# Patient Record
Sex: Male | Born: 1950
Health system: Southern US, Community
[De-identification: ages and names within clinical notes are randomized; demographics above are authoritative.]

## PROBLEM LIST (undated history)

## (undated) DIAGNOSIS — Z860100 Personal history of colon polyps, unspecified: Secondary | ICD-10-CM

## (undated) DIAGNOSIS — K649 Unspecified hemorrhoids: Secondary | ICD-10-CM

## (undated) DIAGNOSIS — E041 Nontoxic single thyroid nodule: Secondary | ICD-10-CM

## (undated) DIAGNOSIS — I10 Essential (primary) hypertension: Secondary | ICD-10-CM

## (undated) DIAGNOSIS — C61 Malignant neoplasm of prostate: Secondary | ICD-10-CM

## (undated) DIAGNOSIS — Z973 Presence of spectacles and contact lenses: Secondary | ICD-10-CM

## (undated) DIAGNOSIS — E785 Hyperlipidemia, unspecified: Secondary | ICD-10-CM

## (undated) DIAGNOSIS — N401 Enlarged prostate with lower urinary tract symptoms: Secondary | ICD-10-CM

## (undated) HISTORY — DX: Essential (primary) hypertension: I10

## (undated) HISTORY — PX: PROSTATE BIOPSY: SHX241

## (undated) HISTORY — DX: Hyperlipidemia, unspecified: E78.5

## (undated) HISTORY — PX: COLONOSCOPY: SHX174

## (undated) HISTORY — PX: LACERATION REPAIR: SHX5168

---

## 2011-08-23 DIAGNOSIS — I209 Angina pectoris, unspecified: Secondary | ICD-10-CM

## 2011-08-23 DIAGNOSIS — R0600 Dyspnea, unspecified: Secondary | ICD-10-CM

## 2011-08-23 DIAGNOSIS — J189 Pneumonia, unspecified organism: Secondary | ICD-10-CM

## 2011-08-23 HISTORY — DX: Dyspnea, unspecified: R06.00

## 2011-08-23 HISTORY — DX: Pneumonia, unspecified organism: J18.9

## 2011-08-23 HISTORY — DX: Angina pectoris, unspecified: I20.9

## 2018-08-03 ENCOUNTER — Encounter: Payer: Self-pay | Admitting: Family Medicine

## 2018-08-03 ENCOUNTER — Ambulatory Visit (INDEPENDENT_AMBULATORY_CARE_PROVIDER_SITE_OTHER): Payer: Medicare HMO | Admitting: Family Medicine

## 2018-08-03 VITALS — BP 132/80 | HR 88 | Ht 69.0 in | Wt 214.1 lb

## 2018-08-03 DIAGNOSIS — R0683 Snoring: Secondary | ICD-10-CM | POA: Diagnosis not present

## 2018-08-03 DIAGNOSIS — N401 Enlarged prostate with lower urinary tract symptoms: Secondary | ICD-10-CM

## 2018-08-03 DIAGNOSIS — Z0000 Encounter for general adult medical examination without abnormal findings: Secondary | ICD-10-CM | POA: Insufficient documentation

## 2018-08-03 DIAGNOSIS — E079 Disorder of thyroid, unspecified: Secondary | ICD-10-CM | POA: Diagnosis not present

## 2018-08-03 DIAGNOSIS — R3911 Hesitancy of micturition: Secondary | ICD-10-CM

## 2018-08-03 LAB — T3, FREE: T3, Free: 3.5 pg/mL (ref 2.3–4.2)

## 2018-08-03 NOTE — Progress Notes (Addendum)
New Patient Office Visit  Subjective:  Patient ID: Logan Orr, male    DOB: [birth year]  Age: 67 y.o. MRN: [id number]  CC:  Chief Complaint  Patient presents with  . Establish Care    HPI [facility] presents for establishment of care.  Patient tells me that his eye doctor suggested he see me for evaluation of apnea.  Patient does snore and does wake up throughout the night.  He lives alone knows of no apneic episodes.  He does feel rested in the morning.  Does have a history of BPH with some urinary hesitancy.  Past that he is not bothered that much with large prostate symptoms.  Recent colonoscopy back in August did show polyps and follow-up was suggested for 5 years.  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  He has no history of thyroid disease.  Pressure is been treated in the past with carvedilol but is currently controlled without medication.  He is taking no medicines on a regular basis.  History reviewed. No pertinent past medical history.  History reviewed. No pertinent surgical history.  History reviewed. No pertinent family history.  Social History   Socioeconomic History  . Marital status: Single    Spouse name: Not on file  . Number of children: Not on file  . Years of education: Not on file  . Highest education level: Not on file  Occupational History  . Not on file  Social Needs  . Financial resource strain: Not on file  . Food insecurity:    Worry: Not on file    Inability: Not on file  . Transportation needs:    Medical: Not on file    Non-medical: Not on file  Tobacco Use  . Smoking status: Never Smoker  . Smokeless tobacco: Never Used  Substance and Sexual Activity  . Alcohol use: Not on file  . Drug use: Not on file  . Sexual activity: Not on file  Lifestyle  . Physical activity:    Days per week: Not on file    Minutes per session: Not on file  . Stress: Not on file  Relationships  . Social connections:    Talks on phone: Not on  file    Gets together: Not on file    Attends religious service: Not on file    Active member of club or organization: Not on file    Attends meetings of clubs or organizations: Not on file    Relationship status: Not on file  . Intimate partner violence:    Fear of current or ex partner: Not on file    Emotionally abused: Not on file    Physically abused: Not on file    Forced sexual activity: Not on file  Other Topics Concern  . Not on file  Social History Narrative  . Not on file    ROS Review of Systems  Constitutional: Negative for chills, diaphoresis, fatigue, fever and unexpected weight change.  HENT: Negative.   Eyes: Negative for photophobia and visual disturbance.  Respiratory: Negative.   Cardiovascular: Negative.   Gastrointestinal: Negative.   Endocrine: Negative for cold intolerance, heat intolerance, polyphagia and polyuria.  Genitourinary: Positive for difficulty urinating. Negative for frequency, hematuria and urgency.  Musculoskeletal: Negative for gait problem and joint swelling.  Skin: Negative for color change and pallor.  Allergic/Immunologic: Negative for immunocompromised state.  Neurological: Negative for light-headedness and headaches.  Hematological: Does not bruise/bleed easily.  Psychiatric/Behavioral: Negative.  Objective:   Today's Vitals: BP 132/80   Pulse 88   Ht 5\' 9"  (1.753 m)   Wt 214 lb 2 oz (97.1 kg)   SpO2 97%   BMI 31.62 kg/m   Physical Exam Constitutional:      General: He is not in acute distress.    Appearance: Normal appearance. He is obese. He is not ill-appearing, toxic-appearing or diaphoretic.  HENT:     Head: Normocephalic and atraumatic.     Right Ear: Tympanic membrane, ear canal and external ear normal. There is no impacted cerumen.     Left Ear: Tympanic membrane, ear canal and external ear normal. There is no impacted cerumen.     Nose: Nose normal. No congestion or rhinorrhea.     Mouth/Throat:     Mouth:  Mucous membranes are dry.     Pharynx: Oropharynx is clear. No oropharyngeal exudate or posterior oropharyngeal erythema.   Eyes:     General: No scleral icterus.       Right eye: No discharge.        Left eye: No discharge.     Extraocular Movements: Extraocular movements intact.     Conjunctiva/sclera: Conjunctivae normal.     Pupils: Pupils are equal, round, and reactive to light.  Neck:     Musculoskeletal: Normal range of motion and neck supple.     Thyroid: Thyroid mass (discreet nodule left lower lobe) present. No thyromegaly or thyroid tenderness.  Cardiovascular:     Rate and Rhythm: Normal rate and regular rhythm.     Heart sounds: Normal heart sounds.  Pulmonary:     Effort: Pulmonary effort is normal.     Breath sounds: Normal breath sounds.  Abdominal:     General: There is no distension.     Palpations: Abdomen is soft. There is no mass.     Tenderness: There is no abdominal tenderness. There is no guarding or rebound.     Hernia: No hernia is present.  Skin:    General: Skin is warm and dry.  Neurological:     Mental Status: He is alert and oriented to person, place, and time.  Psychiatric:        Mood and Affect: Mood normal.        Behavior: Behavior normal.     Assessment & Plan:   Problem List Items Addressed This Visit      Genitourinary   Benign prostatic hyperplasia with urinary hesitancy   Relevant Orders   PSA   Urinalysis, Routine w reflex microscopic     Other   Snores - Primary   Relevant Orders   Ambulatory referral to Sleep Studies   Thyroid mass   Relevant Orders   CBC   TSH   T3, free   US THYROID   Healthcare maintenance   Relevant Orders   CBC   Comprehensive metabolic panel   LDL cholesterol, direct   Lipid panel      No outpatient encounter medications on file as of 08/03/2018.   No facility-administered encounter medications on file as of 08/03/2018.     Follow-up: Return in about 4 weeks (around 08/31/2018).    Patient is not interested in medical treatment for his BPH symptoms at this time.  He was given information on benign prostatic hypertrophy.  Patient was given information on sleep apnea and referred for sleep study he has a Mallon Patti score of 3.5.   12/20: Received a message from [employer] letting  me know that she has been attempting to contact the patient and has been hung up on.  I called and left a message on patient's phone machine to please accept our phone calls and go for the testing.

## 2018-08-03 NOTE — Patient Instructions (Signed)
Benign Prostatic Hyperplasia Benign prostatic hyperplasia (BPH) is an enlarged prostate gland that is caused by the normal aging process and not by cancer. The prostate is a walnut-sized gland that is involved in the production of semen. It is located in front of the rectum and below the bladder. The bladder stores urine and the urethra is the tube that carries the urine out of the body. The prostate may get bigger as a man gets older. An enlarged prostate can press on the urethra. This can make it harder to pass urine. The build-up of urine in the bladder can cause infection. Back pressure and infection may progress to bladder damage and kidney (renal) failure. What are the causes? This condition is part of a normal aging process. However, not all men develop problems from this condition. If the prostate enlarges away from the urethra, urine flow will not be blocked. If it enlarges toward the urethra and compresses it, there will be problems passing urine. What increases the risk? This condition is more likely to develop in men over the age of 47 years. What are the signs or symptoms? Symptoms of this condition include:  Getting up often during the night to urinate.  Needing to urinate frequently during the day.  Difficulty starting urine flow.  Decrease in size and strength of your urine stream.  Leaking (dribbling) after urinating.  Inability to pass urine. This needs immediate treatment.  Inability to completely empty your bladder.  Pain when you pass urine. This is more common if there is also an infection.  Urinary tract infection (UTI).  How is this diagnosed? This condition is diagnosed based on your medical history, a physical exam, and your symptoms. Tests will also be done, such as:  A post-void bladder scan. This measures any amount of urine that may remain in your bladder after you finish urinating.  A digital rectal exam. In a rectal exam, your health care provider  checks your prostate by putting a lubricated, gloved finger into your rectum to feel the back of your prostate gland. This exam detects the size of your gland and any abnormal lumps or growths.  An exam of your urine (urinalysis).  A prostate specific antigen (PSA) screening. This is a blood test used to screen for prostate cancer.  An ultrasound. This test uses sound waves to electronically produce a picture of your prostate gland.  Your health care provider may refer you to a specialist in kidney and prostate diseases (urologist). How is this treated? Once symptoms begin, your health care provider will monitor your condition (active surveillance or watchful waiting). Treatment for this condition will depend on the severity of your condition. Treatment may include:  Observation and yearly exams. This may be the only treatment needed if your condition and symptoms are mild.  Medicines to relieve your symptoms, including: ? Medicines to shrink the prostate. ? Medicines to relax the muscle of the prostate.  Surgery in severe cases. Surgery may include: ? Prostatectomy. In this procedure, the prostate tissue is removed completely through an open incision or with a laparascope or robotics. ? Transurethral resection of the prostate (TURP). In this procedure, a tool is inserted through the opening at the tip of the penis (urethra). It is used to cut away tissue of the inner core of the prostate. The pieces are removed through the same opening of the penis. This removes the blockage. ? Transurethral incision (TUIP). In this procedure, small cuts are made in the prostate. This  lessens the prostate's pressure on the urethra. ? Transurethral microwave thermotherapy (TUMT). This procedure uses microwaves to create heat. The heat destroys and removes a small amount of prostate tissue. ? Transurethral needle ablation (TUNA). This procedure uses radio frequencies to destroy and remove a small amount of  prostate tissue. ? Interstitial laser coagulation (Portis). This procedure uses a laser to destroy and remove a small amount of prostate tissue. ? Transurethral electrovaporization (TUVP). This procedure uses electrodes to destroy and remove a small amount of prostate tissue. ? Prostatic urethral lift. This procedure inserts an implant to push the lobes of the prostate away from the urethra.  Follow these instructions at home:  Take over-the-counter and prescription medicines only as told by your health care provider.  Monitor your symptoms for any changes. Contact your health care provider with any changes.  Avoid drinking large amounts of liquid before going to bed or out in public.  Avoid or reduce how much caffeine or alcohol you drink.  Give yourself time when you urinate.  Keep all follow-up visits as told by your health care provider. This is important. Contact a health care provider if:  You have unexplained back pain.  Your symptoms do not get better with treatment.  You develop side effects from the medicine you are taking.  Your urine becomes very dark or has a bad smell.  Your lower abdomen becomes distended and you have trouble passing your urine. Get help right away if:  You have a fever or chills.  You suddenly cannot urinate.  You feel lightheaded, or very dizzy, or you faint.  There are large amounts of blood or clots in the urine.  Your urinary problems become hard to manage.  You develop moderate to severe low back or flank pain. The flank is the side of your body between the ribs and the hip. These symptoms may represent a serious problem that is an emergency. Do not wait to see if the symptoms will go away. Get medical help right away. Call your local emergency services (911 in the [place] Do not drive yourself to the hospital. Summary  Benign prostatic hyperplasia (BPH) is an enlarged prostate that is caused by the normal aging process and not by  cancer.  An enlarged prostate can press on the urethra. This can make it hard to pass urine.  This condition is part of a normal aging process and is more likely to develop in men over the age of 95 years.  Get help right away if you suddenly cannot urinate. This information is not intended to replace advice given to you by your health care provider. Make sure you discuss any questions you have with your health care provider. Document Released: 08/08/2005 Document Revised: 09/12/2016 Document Reviewed: 09/12/2016 Elsevier Interactive Patient Education  2018 [place].  Sleep Apnea Sleep apnea is a condition that affects breathing. People with sleep apnea have moments during sleep when their breathing pauses briefly or gets shallow. Sleep apnea can cause these symptoms:  Trouble staying asleep.  Sleepiness or tiredness during the day.  Irritability.  Loud snoring.  Morning headaches.  Trouble concentrating.  Forgetting things.  Less interest in sex.  Being sleepy for no reason.  Mood swings.  Personality changes.  Depression.  Waking up a lot during the night to pee (urinate).  Dry mouth.  Sore throat.  Follow these instructions at home:  Make any changes in your routine that your doctor recommends.  Eat a healthy, well-balanced diet.  Take over-the-counter and prescription medicines only as told by your doctor.  Avoid using alcohol, calming medicines (sedatives), and narcotic medicines.  Take steps to lose weight if you are overweight.  If you were given a machine (device) to use while you sleep, use it only as told by your doctor.  Do not use any tobacco products, such as cigarettes, chewing tobacco, and e-cigarettes. If you need help quitting, ask your doctor.  Keep all follow-up visits as told by your doctor. This is important. Contact a doctor if:  The machine that you were given to use during sleep is uncomfortable or does not seem to be  working.  Your symptoms do not get better.  Your symptoms get worse. Get help right away if:  Your chest hurts.  You have trouble breathing in enough air (shortness of breath).  You have an uncomfortable feeling in your back, arms, or stomach.  You have trouble talking.  One side of your body feels weak.  A part of your face is hanging down (drooping). These symptoms may be an emergency. Do not wait to see if the symptoms will go away. Get medical help right away. Call your local emergency services (911 in the [place] Do not drive yourself to the hospital. This information is not intended to replace advice given to you by your health care provider. Make sure you discuss any questions you have with your health care provider. Document Released: 05/17/2008 Document Revised: 04/03/2016 Document Reviewed: 05/18/2015 Elsevier Interactive Patient Education  [employer].

## 2018-08-04 LAB — URINALYSIS, ROUTINE W REFLEX MICROSCOPIC
Bilirubin Urine: NEGATIVE
Glucose, UA: NEGATIVE
Hgb urine dipstick: NEGATIVE
Ketones, ur: NEGATIVE
Leukocytes, UA: NEGATIVE
Nitrite: NEGATIVE
Protein, ur: NEGATIVE
Specific Gravity, Urine: 1.018 (ref 1.001–1.03)
pH: 5.5 (ref 5.0–8.0)

## 2018-08-04 LAB — COMPREHENSIVE METABOLIC PANEL
AG Ratio: 1.5 (calc) (ref 1.0–2.5)
ALT: 13 U/L (ref 9–46)
AST: 16 U/L (ref 10–35)
Albumin: 4.3 g/dL (ref 3.6–5.1)
Alkaline phosphatase (APISO): 72 U/L (ref 40–115)
BUN/Creatinine Ratio: 12 (calc) (ref 6–22)
BUN: 16 mg/dL (ref 7–25)
CO2: 22 mmol/L (ref 20–32)
Calcium: 9.6 mg/dL (ref 8.6–10.3)
Chloride: 104 mmol/L (ref 98–110)
Creat: 1.35 mg/dL — ABNORMAL HIGH (ref 0.70–1.25)
Globulin: 2.9 g/dL (calc) (ref 1.9–3.7)
Glucose, Bld: 90 mg/dL (ref 65–99)
Potassium: 3.9 mmol/L (ref 3.5–5.3)
Sodium: 139 mmol/L (ref 135–146)
Total Bilirubin: 0.6 mg/dL (ref 0.2–1.2)
Total Protein: 7.2 g/dL (ref 6.1–8.1)

## 2018-08-04 LAB — LIPID PANEL
Cholesterol: 281 mg/dL — ABNORMAL HIGH (ref <200)
HDL: 55 mg/dL (ref >40)
LDL Cholesterol (Calc): 199 mg/dL (calc) — ABNORMAL HIGH
Non-HDL Cholesterol (Calc): 226 mg/dL (calc) — ABNORMAL HIGH (ref <130)
Total CHOL/HDL Ratio: 5.1 (calc) — ABNORMAL HIGH (ref <5.0)
Triglycerides: 122 mg/dL (ref <150)

## 2018-08-04 LAB — CBC
HCT: 40.8 % (ref 38.5–50.0)
Hemoglobin: 14.2 g/dL (ref 13.2–17.1)
MCH: 30.3 pg (ref 27.0–33.0)
MCHC: 34.8 g/dL (ref 32.0–36.0)
MCV: 87 fL (ref 80.0–100.0)
MPV: 10.3 fL (ref 7.5–12.5)
Platelets: 297 10*3/uL (ref 140–400)
RBC: 4.69 10*6/uL (ref 4.20–5.80)
RDW: 13.1 % (ref 11.0–15.0)
WBC: 3.4 10*3/uL — ABNORMAL LOW (ref 3.8–10.8)

## 2018-08-04 LAB — LDL CHOLESTEROL, DIRECT: Direct LDL: 203 mg/dL — ABNORMAL HIGH (ref <100)

## 2018-08-04 LAB — PSA: PSA: 5.8 ng/mL — ABNORMAL HIGH (ref <=4.0)

## 2018-08-04 LAB — TSH: TSH: 0.43 mIU/L (ref 0.40–4.50)

## 2018-08-22 HISTORY — PX: EYE SURGERY: SHX253

## 2018-08-29 ENCOUNTER — Telehealth: Payer: Self-pay

## 2018-08-29 NOTE — Telephone Encounter (Signed)
Copied from [place] [id number]. Topic: General - Other >> Aug 29, 2018 11:10 AM Ivar Drape wrote: Reason for CRM:  Patient would like to know what his Friday, 08/31/2018 appt is for.  Please advise.

## 2018-08-29 NOTE — Telephone Encounter (Signed)
Patient just needed to know what time his appointment was. Patient aware his appointment is at [facility] on Friday.

## 2018-08-29 NOTE — Telephone Encounter (Signed)
There are multiple lab issues that need to be discussed.

## 2018-08-31 ENCOUNTER — Ambulatory Visit (INDEPENDENT_AMBULATORY_CARE_PROVIDER_SITE_OTHER): Payer: Medicare HMO | Admitting: Family Medicine

## 2018-08-31 ENCOUNTER — Ambulatory Visit: Payer: Medicare HMO | Admitting: Family Medicine

## 2018-08-31 ENCOUNTER — Encounter: Payer: Self-pay | Admitting: Family Medicine

## 2018-08-31 ENCOUNTER — Telehealth: Payer: Self-pay

## 2018-08-31 VITALS — BP 140/92 | HR 97 | Ht 69.0 in | Wt 214.0 lb

## 2018-08-31 DIAGNOSIS — R0683 Snoring: Secondary | ICD-10-CM

## 2018-08-31 DIAGNOSIS — N401 Enlarged prostate with lower urinary tract symptoms: Secondary | ICD-10-CM

## 2018-08-31 DIAGNOSIS — E079 Disorder of thyroid, unspecified: Secondary | ICD-10-CM

## 2018-08-31 DIAGNOSIS — E7800 Pure hypercholesterolemia, unspecified: Secondary | ICD-10-CM | POA: Insufficient documentation

## 2018-08-31 DIAGNOSIS — I10 Essential (primary) hypertension: Secondary | ICD-10-CM | POA: Insufficient documentation

## 2018-08-31 DIAGNOSIS — R3911 Hesitancy of micturition: Secondary | ICD-10-CM

## 2018-08-31 DIAGNOSIS — R972 Elevated prostate specific antigen [PSA]: Secondary | ICD-10-CM | POA: Insufficient documentation

## 2018-08-31 MED ORDER — ATORVASTATIN CALCIUM 20 MG PO TABS: 20.0000 mg | ORAL_TABLET | Freq: Every day | ORAL | 3 refills | Status: DC

## 2018-08-31 MED ORDER — TAMSULOSIN HCL 0.4 MG PO CAPS: 0.4000 mg | ORAL_CAPSULE | Freq: Every day | ORAL | 3 refills | Status: DC

## 2018-08-31 NOTE — Patient Instructions (Signed)
Preventing High Cholesterol  Cholesterol is a waxy, fat-like substance that your body needs in small amounts. Your liver makes all the cholesterol that your body needs. Having high cholesterol (hypercholesterolemia) increases your risk for heart disease and stroke. Extra (excess) cholesterol comes from the food you eat, such as animal-based fat (saturated fat) from meat and some dairy products.  High cholesterol can often be prevented with diet and lifestyle changes. If you already have high cholesterol, you can control it with diet and lifestyle changes, as well as medicine.  What nutrition changes can be made?   Eat less saturated fat. Foods that contain saturated fat include red meat and some dairy products.   Avoid processed meats, like bacon and lunch meats.   Avoid trans fats, which are found in margarine and some baked goods.   Avoid foods and beverages that have added sugars.   Eat more fruits, vegetables, and whole grains.   Choose healthy sources of protein, such as fish, poultry, and nuts.   Choose healthy sources of fat, such as:  ? Nuts.  ? Vegetable oils, especially olive oil.  ? Fish that have healthy fats (omega-3 fatty acids), such as mackerel or salmon.  What lifestyle changes can be made?     Lose weight if you are overweight. Losing 5-10 lb (2.3-4.5 kg) can help prevent or control high cholesterol and reduce your risk for diabetes and high blood pressure. Ask your health care provider to help you with a diet and exercise plan to safely lose weight.   Get enough exercise. Do at least 150 minutes of moderate-intensity exercise each week.  ? You could do this in short exercise sessions several times a day, or you could do longer exercise sessions a few times a week. For example, you could take a brisk 10-minute walk or bike ride, 3 times a day, for 5 days a week.   Do not smoke. If you need help quitting, ask your health care provider.   Limit your alcohol intake. If you drink  alcohol, limit alcohol intake to no more than 1 drink a day for nonpregnant women and 2 drinks a day for men. One drink equals 12 oz of beer, 5 oz of wine, or 1 oz of hard liquor.  Why are these changes important?    If you have high cholesterol, deposits (plaques) may build up on the walls of your blood vessels. Plaques make the arteries narrower and stiffer, which can restrict or block blood flow and cause blood clots to form. This greatly increases your risk for heart attack and stroke. Making diet and lifestyle changes can reduce your risk for these life-threatening conditions.  What can I do to lower my risk?   Manage your risk factors for high cholesterol. Talk with your health care provider about all of your risk factors and how to lower your risk.   Manage other conditions that you have, such as diabetes or high blood pressure (hypertension).   Have your cholesterol checked at regular intervals.   Keep all follow-up visits as told by your health care provider. This is important.  How is this treated?  In addition to diet and lifestyle changes, your health care provider may recommend medicines to help lower cholesterol, such as a medicine to reduce the amount of cholesterol made in your liver. You may need medicine if:   Diet and lifestyle changes do not lower your cholesterol enough.   You have high cholesterol and other risk factors   for heart disease or stroke.  Take over-the-counter and prescription medicines only as told by your health care provider.  Where to find more information   American Heart Association: www.heart.org/HEARTORG/Conditions/Cholesterol/Cholesterol_UCM_001089_SubHomePage.jsp   National Heart, Lung, and Blood Institute: www.nhlbi.nih.gov/health/resources/heart/heart-cholesterol-hbc-what-html  Summary   High cholesterol increases your risk for heart disease and stroke. By keeping your cholesterol level low, you can reduce your risk for these conditions.   Diet and lifestyle  changes are the most important steps in preventing high cholesterol.   Work with your health care provider to manage your risk factors, and have your blood tested regularly.  This information is not intended to replace advice given to you by your health care provider. Make sure you discuss any questions you have with your health care provider.  Document Released: 08/23/2015 Document Revised: 04/16/2016 Document Reviewed: 04/16/2016  Elsevier Interactive Patient Education  2019 [employer]

## 2018-08-31 NOTE — Telephone Encounter (Signed)
I called and spoke with patient. There are no notes of an attempted call from our office, patient is aware.

## 2018-08-31 NOTE — Telephone Encounter (Signed)
Copied from [place] [id number]. Topic: General - Inquiry >> Aug 31, 2018 11:34 AM Margot Ables wrote: Reason for CRM: pt states he missed a call from the office approx 11:30am. No notes for this. Please f/u with pt.

## 2018-08-31 NOTE — Progress Notes (Signed)
Established Patient Office Visit  Subjective:  Patient ID: Logan Orr, male    DOB: [birth year]  Age: 68 y.o. MRN: [id number]  CC:  Chief Complaint  Patient presents with  . Follow-up    HPI [facility] presents for follow-up of multiple medical issues and concerns.  Patient discloses to me that he is stopped and started on amlodipine 10 mg daily for his blood pressure.  I told him that I agree with this medicine and would like for him to continue it.  He has been taking Voltaren 50 mg twice daily as needed for sleep pains.  Reminded him that this medicine is okay to use on a as needed basis but it can affect his blood pressure.  He does have BPH/LUTS with urinary hesitancy frequency decreased force of stream and nocturia.  We had a discussion about the fact that it kept hanging up on the radiology scheduler for his thyroid ultrasound.  I frankly told him that he could be dealing with a thyroid cancer and he needs to have the study done.  We discussed his extremely elevated LDL cholesterol and that I would recommend treatment as well is lowering the fat and cholesterol in his diet.  He has been referred for a sleep study.  History reviewed. No pertinent past medical history.  History reviewed. No pertinent surgical history.  History reviewed. No pertinent family history.  Social History   Socioeconomic History  . Marital status: Single    Spouse name: Not on file  . Number of children: Not on file  . Years of education: Not on file  . Highest education level: Not on file  Occupational History  . Not on file  Social Needs  . Financial resource strain: Not on file  . Food insecurity:    Worry: Not on file    Inability: Not on file  . Transportation needs:    Medical: Not on file    Non-medical: Not on file  Tobacco Use  . Smoking status: Never Smoker  . Smokeless tobacco: Never Used  Substance and Sexual Activity  . Alcohol use: Not on file  . Drug use: Not on file    . Sexual activity: Not on file  Lifestyle  . Physical activity:    Days per week: Not on file    Minutes per session: Not on file  . Stress: Not on file  Relationships  . Social connections:    Talks on phone: Not on file    Gets together: Not on file    Attends religious service: Not on file    Active member of club or organization: Not on file    Attends meetings of clubs or organizations: Not on file    Relationship status: Not on file  . Intimate partner violence:    Fear of current or ex partner: Not on file    Emotionally abused: Not on file    Physically abused: Not on file    Forced sexual activity: Not on file  Other Topics Concern  . Not on file  Social History Narrative  . Not on file    Outpatient Medications Prior to Visit  Medication Sig Dispense Refill  . amLODipine (NORVASC) 10 MG tablet Take 10 mg by mouth daily.    . diclofenac (VOLTAREN) 50 MG EC tablet Take 50 mg by mouth 2 (two) times daily.     No facility-administered medications prior to visit.     No Known Allergies  ROS  Review of Systems  Constitutional: Negative.   HENT: Negative.   Eyes: Negative.   Respiratory: Positive for apnea.   Cardiovascular: Negative.   Gastrointestinal: Negative.   Endocrine: Negative for cold intolerance, heat intolerance, polyphagia and polyuria.  Genitourinary: Positive for difficulty urinating and frequency. Negative for urgency.  Musculoskeletal: Negative for gait problem and joint swelling.  Skin: Negative for pallor and rash.  Allergic/Immunologic: Negative for immunocompromised state.  Neurological: Negative for light-headedness and headaches.  Hematological: Does not bruise/bleed easily.  Psychiatric/Behavioral: Negative.       Objective:    Physical Exam  Constitutional: He is oriented to person, place, and time. He appears well-developed and well-nourished. No distress.  HENT:  Head: Normocephalic and atraumatic.  Right Ear: External ear  normal.  Left Ear: External ear normal.  Eyes: Conjunctivae are normal. Right eye exhibits no discharge. Left eye exhibits no discharge. No scleral icterus.  Neck: No JVD present. No tracheal deviation present.  Pulmonary/Chest: Effort normal. No stridor.  Genitourinary: Rectum:     Guaiac result negative.     No anal fissure, tenderness, external hemorrhoid, internal hemorrhoid or abnormal anal tone.  Prostate is enlarged. Prostate is not tender.  Neurological: He is alert and oriented to person, place, and time.  Skin: Skin is warm and dry. He is not diaphoretic.  Psychiatric: He has a normal mood and affect. His behavior is normal.    BP (!) 140/92   Pulse 97   Ht 5\' 9"  (1.753 m)   Wt 214 lb (97.1 kg)   SpO2 98%   BMI 31.60 kg/m  Wt Readings from Last 3 Encounters:  08/31/18 214 lb (97.1 kg)  08/03/18 214 lb 2 oz (97.1 kg)   BP Readings from Last 3 Encounters:  08/31/18 (!) 140/92  08/03/18 132/80   Guideline developer:  UpToDate (see UpToDate for funding source) Date Released: June 2014  Health Maintenance Due  Topic Date Due  . Hepatitis C Screening  06-May-1951  . TETANUS/TDAP  11/30/1970  . COLONOSCOPY  11/29/2001  . PNA vac Low Risk Adult (1 of 2 - PCV13) 11/29/2016  . INFLUENZA VACCINE  03/22/2018    There are no preventive care reminders to display for this patient.  Lab Results  Component Value Date   TSH 0.43 08/03/2018   Lab Results  Component Value Date   WBC 3.4 (L) 08/03/2018   HGB 14.2 08/03/2018   HCT 40.8 08/03/2018   MCV 87.0 08/03/2018   PLT 297 08/03/2018   Lab Results  Component Value Date   NA 139 08/03/2018   K 3.9 08/03/2018   CO2 22 08/03/2018   GLUCOSE 90 08/03/2018   BUN 16 08/03/2018   CREATININE 1.35 (H) 08/03/2018   BILITOT 0.6 08/03/2018   AST 16 08/03/2018   ALT 13 08/03/2018   PROT 7.2 08/03/2018   CALCIUM 9.6 08/03/2018   Lab Results  Component Value Date   CHOL 281 (H) 08/03/2018   Lab Results  Component  Value Date   HDL 55 08/03/2018   Lab Results  Component Value Date   LDLCALC 199 (H) 08/03/2018   Lab Results  Component Value Date   TRIG 122 08/03/2018   Lab Results  Component Value Date   CHOLHDL 5.1 (H) 08/03/2018   No results found for: HGBA1C    Assessment & Plan:   Problem List Items Addressed This Visit      Cardiovascular and Mediastinum   Essential hypertension - Primary   Relevant  Medications   atorvastatin (LIPITOR) 20 MG tablet   amLODipine (NORVASC) 10 MG tablet     Genitourinary   Benign prostatic hyperplasia with urinary hesitancy   Relevant Medications   tamsulosin (FLOMAX) 0.4 MG CAPS capsule   Other Relevant Orders   Ambulatory referral to Urology     Other   Snores   Thyroid mass   Elevated LDL cholesterol level   Relevant Medications   atorvastatin (LIPITOR) 20 MG tablet   Elevated PSA, less than 10 ng/ml   Relevant Orders   Ambulatory referral to Urology      Meds ordered this encounter  Medications  . atorvastatin (LIPITOR) 20 MG tablet    Sig: Take 1 tablet (20 mg total) by mouth daily.    Dispense:  90 tablet    Refill:  3  . tamsulosin (FLOMAX) 0.4 MG CAPS capsule    Sig: Take 1 capsule (0.4 mg total) by mouth daily.    Dispense:  30 capsule    Refill:  3    Follow-up: Return in about 3 months (around 11/30/2018).   Patient agrees to go ahead and start Lipitor as well as lowering the fat and cholesterol in his diet to help lower his LDL cholesterol.  He would like to proceed with a urology referral as well as a trial of flow max.  Urged him to keep his appointment for his thyroid ultrasound and sleep study.  Hopefully all of this will have been completed by the time I see him in follow-up in 3 months.

## 2018-09-17 ENCOUNTER — Other Ambulatory Visit: Payer: Self-pay | Admitting: Family Medicine

## 2018-09-17 NOTE — Telephone Encounter (Signed)
Copied from [place] [id number]. Topic: Quick Communication - See Telephone Encounter >> Sep 17, 2018  4:06 PM Ivar Drape wrote: CRM for notification. See Telephone encounter for: 09/17/18. Patient would like a refill on his amLODipine (NORVASC) 10 MG tablet medication and have it sent to his preferred pharmacy for right now, [facility] in [place], [place].

## 2018-09-18 MED ORDER — AMLODIPINE BESYLATE 10 MG PO TABS: 10.0000 mg | ORAL_TABLET | Freq: Every day | ORAL | 2 refills | Status: DC

## 2018-09-18 NOTE — Telephone Encounter (Signed)
Requested medication (s) are due for refill today: not specified  Requested medication (s) are on the active medication list: yes    Last refill: 08/31/2018  Future visit scheduled   Notes to clinic:historical provider  Requested Prescriptions  Pending Prescriptions Disp Refills   amLODipine (NORVASC) 10 MG tablet      Sig: Take 1 tablet (10 mg total) by mouth daily.     Cardiovascular:  Calcium Channel Blockers Failed - 09/17/2018  7:25 PM      Failed - Last BP in normal range    BP Readings from Last 1 Encounters:  08/31/18 (!) 140/92         Passed - Valid encounter within last 6 months    Recent Outpatient Visits          2 weeks ago Essential hypertension   [facility] [place], Mortimer Fries, MD   1 month ago Snores   [facility] Ethelene Hal, Mortimer Fries, MD      Future Appointments            In 2 months E[facility] Hal, Mortimer Fries, MD [facility] [address], [facility]

## 2018-09-24 ENCOUNTER — Ambulatory Visit: Payer: Medicare HMO | Admitting: Neurology

## 2018-09-24 ENCOUNTER — Encounter: Payer: Self-pay | Admitting: Neurology

## 2018-09-24 VITALS — BP 140/96 | HR 81 | Ht 69.0 in | Wt 214.0 lb

## 2018-09-24 DIAGNOSIS — I10 Essential (primary) hypertension: Secondary | ICD-10-CM

## 2018-09-24 DIAGNOSIS — E079 Disorder of thyroid, unspecified: Secondary | ICD-10-CM | POA: Diagnosis not present

## 2018-09-24 DIAGNOSIS — R0683 Snoring: Secondary | ICD-10-CM | POA: Diagnosis not present

## 2018-09-24 DIAGNOSIS — R3911 Hesitancy of micturition: Secondary | ICD-10-CM

## 2018-09-24 DIAGNOSIS — N401 Enlarged prostate with lower urinary tract symptoms: Secondary | ICD-10-CM

## 2018-09-24 NOTE — Progress Notes (Signed)
SLEEP MEDICINE CLINIC   Provider:  Larey Seat, MD   Primary Care Physician:   Libby Maw, MD Referring Provider: Libby Maw,*    Chief Complaint  Patient presents with  . New Patient (Initial Visit)    pt alone, rm 10. pt states that on average he sleeps about 4-5 hours a night. pt snores at night pt denies having any decrease in energy. never had a sleep study completed before.     HPI:  Logan Orr is a 68 y.o. male patient of Dr. Bebe Shaggy , but seen here upon referral of Dr. Deloria Lair, seen here to evaluate for a possible correlation between sleep apnea and eye disease.   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  His ophthalmologist suggested an evaluation for sleep apnea and his PCP has then referred him here.  The patient has never been a smoker he presents with normal vital signs and does not carry a diagnosis of of diabetes or any other endocrine disorder but his primary care physician was concerned about a thyroid mass".  He had recently in mid December 2019 some labs drawn which I will call you later on.  I would like to add that the benign prostate hyperplasia has not led to a lot of nocturia and is not bothering the patient very much.  He denies sleepiness or excessive fatigue in daytime. He lives alone.   Sleep habits are as follows: Logan Orr goes to bed by 8.30- 9.30 and is promptly asleep. The bedroom is cool, quiet and dark. Uses an adjustable bed. He sleeps lateral, with 2-3 pillows. He has 2-3 times nocturia. No GERD, no pain. No palpitations and no diaphoresis.  Rises on weekdays at 4. 30 AM- after 5-6 hours of sleep. Feels refreshed in AM. He commutes for 1 hour 45 minutes.     Sleep medical history: see above. HTN.  Retrognathia, small mandible. he is unsure about his eye exam and results.    Family sleep history: no history available.   Social history: single,never smoked, drinks beer, caffeine - 1-2 cups coffee in AM-. 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   Review of Systems: Out of a complete 14 system review, the patient complains of only the following symptoms, and all other reviewed systems are negative.  snoring, nocturia, fragmented sleep.   Epworth score: 2 , Fatigue severity score 17  , depression score negative.    Social History   Socioeconomic History  . Marital status: Single    Spouse name: Not on file  . Number of children: Not on file  . Years of education: Not on file  . Highest education level: Not on file  Occupational History  . Not on file  Social Needs  . Financial resource strain: Not on file  . Food insecurity:    Worry: Not on file    Inability: Not on file  . Transportation needs:    Medical: Not on file    Non-medical: Not on file  Tobacco Use  . Smoking status: Never Smoker  . Smokeless tobacco: Never Used  Substance and Sexual Activity  . Alcohol use: Yes    Alcohol/week: 12.0 standard drinks    Types: 12 Cans  of beer per week  . Drug use: Never  . Sexual activity: Not on file  Lifestyle  . Physical activity:    Days per week: Not on file    Minutes per session: Not on file  . Stress: Not on file  Relationships  . Social connections:    Talks on phone: Not on file    Gets together: Not on file    Attends religious service: Not on file    Active member of club or organization: Not on file    Attends meetings of clubs or organizations: Not on file    Relationship status: Not on file  . Intimate partner violence:    Fear of current or ex partner: Not on file    Emotionally abused: Not on file    Physically abused: Not on file    Forced sexual activity: Not on file  Other Topics Concern  . Not on file  Social History  Narrative  . Not on file    No family history on file.  Past Medical History:  Diagnosis Date  . HLD (hyperlipidemia)   . Hypertension     No past surgical history on file.  Current Outpatient Medications  Medication Sig Dispense Refill  . amLODipine (NORVASC) 10 MG tablet Take 1 tablet (10 mg total) by mouth daily. 90 tablet 2  . atorvastatin (LIPITOR) 20 MG tablet Take 1 tablet (20 mg total) by mouth daily. 90 tablet 3  . diclofenac (VOLTAREN) 50 MG EC tablet Take 50 mg by mouth 2 (two) times daily.    . tamsulosin (FLOMAX) 0.4 MG CAPS capsule Take 1 capsule (0.4 mg total) by mouth daily. 30 capsule 3   No current facility-administered medications for this visit.     Allergies as of 09/24/2018  . (No Known Allergies)    Vitals: BP (!) 140/96   Pulse 81   Ht 5\' 9"  (1.753 m)   Wt 214 lb (97.1 kg)   BMI 31.60 kg/m  Last Weight:  Wt Readings from Last 1 Encounters:  09/24/18 214 lb (97.1 kg)   YPP:JKDT mass index is 31.6 kg/m.     Last Height:   Ht Readings from Last 1 Encounters:  09/24/18 5\' 9"  (1.753 m)    Physical exam:  General: The patient is awake, alert and appears not in acute distress. The patient is  Groomed. He has facial hair.   Head: Normocephalic, atraumatic. Neck is supple. Mallampati 4 ,  neck circumference:17". Nasal airflow patent , . Retrognathia is severe.   Cardiovascular:  Regular rate and rhythm without murmurs or carotid bruit, and without distended neck veins. Respiratory: Lungs are clear to auscultation. Skin:  Without evidence of edema, or rash Trunk: The patient's posture is erect.   Neurologic exam : The patient is awake and alert, oriented to place and time.   Memory subjective described as intact.  Attention span & concentration ability appears normal.  Speech is fluent,  without dysarthria, dysphonia or aphasia.  Mood and affect are appropriate.  Cranial nerves: Decreased sense of smell. taste is preserved. Pupils are equal  and briskly reactive to light. Cloudy lenses -Cataract ?  Extraocular movements  in vertical and horizontal planes intact and without nystagmus. Visual fields by finger perimetry are intact. Hearing to finger rub intact.  Facial sensation intact to fine touch.  Facial motor strength is symmetric and tongue and uvula move midline. Shoulder shrug was symmetrical.   Motor exam:  Normal tone, muscle bulk  and symmetric strength in all extremities.  Sensory:  Fine touch, pinprick and vibration were normal.  Coordination: Rapid alternating movements in the fingers/hands was normal. Penmanship changed over years- spiral draw shows mild tremor.  . Finger-to-nose maneuver  normal without evidence of ataxia, dysmetria or tremor. Gait and station: Patient walks without assistive device Stance is stable and normal.  Turns with 3  Steps.  Deep tendon reflexes: in the upper and lower extremities are symmetrically attenuated . Babinski maneuver response deferred.     WBC low, no anemia, normal T3 and TSH.   Assessment:  After physical and neurologic examination, review of laboratory studies,  Personal review of imaging studies, reports of other /same  Imaging studies, results of polysomnography and / or neurophysiology testing and pre-existing records as far as provided in visit., my assessment is:   1) There is a moderate risk of OSA present, with a hypoplastic mandible and reports of snoring.   2) No evidence of endocrine disorders.   3) low WBC - unclear significance.   The patient was advised of the nature of the diagnosed disorder , the treatment options and the  risks for general health and wellness arising from not treating the condition.   I spent more than 45 minutes of face to face time with the patient.  Greater than 50% of time was spent in counseling and coordination of care. We have discussed the diagnosis and differential and I answered the patient's questions.    Plan:  Treatment plan  and additional workup :  I will assess the OSA risk in a HST, by watch pat.  RV after test.   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 The first symptoms are often loss of smell and balance problems.     Larey Seat, MD 0/04/4075, 8:08 PM  Certified in Neurology by ABPN Certified in [place] by [facility] [address] [place], [place] [address]

## 2018-10-14 ENCOUNTER — Ambulatory Visit (INDEPENDENT_AMBULATORY_CARE_PROVIDER_SITE_OTHER): Payer: Medicare HMO | Admitting: Neurology

## 2018-10-14 DIAGNOSIS — G4733 Obstructive sleep apnea (adult) (pediatric): Secondary | ICD-10-CM | POA: Diagnosis not present

## 2018-10-14 DIAGNOSIS — N401 Enlarged prostate with lower urinary tract symptoms: Secondary | ICD-10-CM

## 2018-10-14 DIAGNOSIS — I10 Essential (primary) hypertension: Secondary | ICD-10-CM

## 2018-10-14 DIAGNOSIS — E079 Disorder of thyroid, unspecified: Secondary | ICD-10-CM

## 2018-10-14 DIAGNOSIS — R0683 Snoring: Secondary | ICD-10-CM

## 2018-10-14 DIAGNOSIS — R3911 Hesitancy of micturition: Principal | ICD-10-CM

## 2018-10-17 NOTE — Procedures (Signed)
[facility] [place], [place] [address] NAMEFaysal Orr                                                                          DOB: 12/13/1950 MEDICAL RECORD No: [id number]                                                         DOS:  10/14/2018 REFERRING PHYSICIAN: Deloria Lair, MD and Abelino Derrick, MD STUDY PERFORMED: Home Sleep Test on Watch Pat HISTORY: Logan Orr is a 68 y.o. male patient of Dr. Bebe Shaggy, but seen here upon referral of Dr. Deloria Lair to evaluate for a possible correlation between sleep apnea and vascular signs of changes in the retinal perfusion.  Chief complaint according to patient: "I don't know why I am here"  Mr. 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  His ophthalmologist suggested an evaluation for sleep apnea and his PCP has then referred him here.  The patient has never been a smoker he presents with normal vital signs and does not carry a diagnosis of diabetes or any other endocrine disorder but his primary care physician was concerned about a thyroid mass.  He had recently in mid- December 2019 some labs drawn.  I would like to add that the benign prostate hyperplasia has not led to a lot of nocturia and is not bothering the patient very much.  He denies sleepiness or excessive fatigue in daytime. He lives alone.  Epworth score: 2/24, Fatigue severity score 17/63 points, BMI: 31.7 kg/m2.    STUDY RESULTS:  Total Recording Time:  9 h 55 mins; Calculated Sleep Time: 5 h 22 mins Total Apnea/Hypopnea Index (AHI): 5.3 /h; RDI: 6.1/h; REM AHI: 8.4/h Average Oxygen Saturation:  95 %; Lowest Oxygen Desaturation:   87%  Total Time in Oxygen Saturation below 88 %: 0.1 minutes  Average Heart Rate:  63 bpm (between 52 and 96 bpm) IMPRESSION: insignificant sleep apnea degree at AHI of  5.3/h without prolonged hypoxemia or cardiac rate variability. Mild snoring is resumed by RDI.  RECOMMENDATION: Given the absence of clinical symptoms of sleep apnea, no intervention is necessary.    I certify that I have reviewed the raw data recording prior to the issuance of this report in accordance with the standards of the American Academy of Sleep Medicine (AASM). Larey Seat, M.D.  02.26.2020    Medical Director of [facility] at [facility], accredited by the AASM. Diplomat of the ABPN and ABSM.

## 2018-10-18 ENCOUNTER — Telehealth: Payer: Self-pay | Admitting: Neurology

## 2018-10-18 NOTE — Telephone Encounter (Signed)
Pt returned RN's call. He is not suppose to take calls while at work. He will call back.

## 2018-10-18 NOTE — Telephone Encounter (Signed)
Patient called back and states that he is at work and said I would need to make it fast cause he can't talk at work. I reviewed quickly his sleep study with him. Advised him that his study showed very insignificant apnea. Informed him that with the mild degree of the apnea that is present he would not need intervention. Pt verbalized understanding. He began talking in the background to a coworker and I informed him to call us if he had any questions.

## 2018-10-18 NOTE — Telephone Encounter (Signed)
Called patient to discuss sleep study results. No answer at this time. LVM for the patient to call back.   

## 2018-10-18 NOTE — Telephone Encounter (Signed)
-----   Message from Larey Seat, MD sent at 10/17/2018  4:54 PM EST ----- IMPRESSION: insignificant sleep apnea degree at AHI of 5.3/h  without prolonged hypoxemia or cardiac rate variability. Mild  snoring is resumed by RDI.  RECOMMENDATION: Given the absence of clinical symptoms of sleep  apnea, no intervention is necessary.

## 2018-11-29 ENCOUNTER — Telehealth: Payer: Self-pay | Admitting: Radiation Oncology

## 2018-11-29 NOTE — Telephone Encounter (Signed)
New Message:    LVM for a return call from the patient to schedule from referral received on 11/29/18

## 2018-12-07 ENCOUNTER — Ambulatory Visit: Payer: Medicare HMO | Admitting: Family Medicine

## 2018-12-10 ENCOUNTER — Encounter: Payer: Self-pay | Admitting: Radiation Oncology

## 2018-12-10 ENCOUNTER — Ambulatory Visit: Payer: Medicare HMO | Admitting: Family Medicine

## 2018-12-10 NOTE — Progress Notes (Signed)
GU Location of Tumor / Histology: prostatic adenocarcinoma  If Prostate Cancer, Gleason Score is (3 + 4) and PSA is (6.31). Prostate volume: 38.58 grams.  Logan Orr was found to have an elevated PSA of 5.8 on 08/03/2018 by his PCP. Patient denies prior knowledge of an elevated PSA.   Biopsies of prostate (if applicable) revealed:    Past/Anticipated interventions by urology, if any: prostate biopsy, prescribe Flomax, referred for consideration of radiotherapy  Past/Anticipated interventions by medical oncology, if any: no  Weight changes, if any: no  Bowel/Bladder complaints, if any: IPSS 12. SHIM 24. Denies dysuria and hematuria. Reports weak urine stream, nocturia and frequency.   Nausea/Vomiting, if any: no  Pain issues, if any:  no  SAFETY ISSUES:  Prior radiation? no  Pacemaker/ICD? no  Possible current pregnancy? no, male patient  Is the patient on methotrexate? no  Current Complaints / Logan details:  68 year old male. Single with one daughter. Adopted thus uncertain of family hx.

## 2018-12-11 ENCOUNTER — Institutional Professional Consult (permissible substitution): Payer: Medicare HMO | Admitting: Radiation Oncology

## 2018-12-11 ENCOUNTER — Ambulatory Visit
Admission: RE | Admit: 2018-12-11 | Discharge: 2018-12-11 | Disposition: A | Discharge status: Home / Self Care | Payer: Medicare HMO | Source: Ambulatory Visit | Attending: Radiation Oncology | Admitting: Radiation Oncology

## 2018-12-11 ENCOUNTER — Other Ambulatory Visit: Payer: Self-pay

## 2018-12-11 ENCOUNTER — Ambulatory Visit: Payer: Medicare HMO

## 2018-12-11 ENCOUNTER — Encounter: Payer: Self-pay | Admitting: Radiation Oncology

## 2018-12-11 VITALS — Ht 69.0 in | Wt 218.0 lb

## 2018-12-11 DIAGNOSIS — C61 Malignant neoplasm of prostate: Secondary | ICD-10-CM

## 2018-12-11 HISTORY — DX: Malignant neoplasm of prostate: C61

## 2018-12-11 NOTE — Progress Notes (Signed)
See progress notes under physician encounter. 

## 2018-12-11 NOTE — Progress Notes (Signed)
Radiation Oncology         (336) [phone] ________________________________  Initial Outpatient Consultation - Conducted via telephone due to current COVID-19 concerns for limiting patient exposure  Name: Logan Orr MRN: [id number]  Date: 12/11/2018  DOB: [birth year]  [id number], Mortimer Fries, MD  Lucas Mallow, MD   REFERRING PHYSICIAN: Lucas Mallow, MD  DIAGNOSIS: 68 y.o. gentleman with Stage T1c adenocarcinoma of the prostate with Gleason score of 3+4, and PSA of 6.3.    ICD-10-CM   1. Malignant neoplasm of prostate ([place]) C61     HISTORY OF PRESENT ILLNESS: Logan Orr is a 68 y.o. male with a diagnosis of prostate cancer. He was noted to have an elevated PSA of 5.8 by his primary care physician, Dr. Ethelene Hal on 08/03/18.  He denies any history of previously elevated PSA.  He is adopted and therefore unknown if there is any family history of prostate cancer.  Accordingly, he was referred for evaluation in urology by Dr. Gloriann Loan on 10/24/2018,  digital rectal examination was performed at that time revealing symmetrical prostate lobes without nodularity.  A repeat PSA was performed at that time and was noted slightly further elevated at 6.31.  The patient proceeded to transrectal ultrasound with 12 biopsies of the prostate on 11/23/2018.  The prostate volume measured 38.58 cc.  Out of 12 core biopsies, 3 were positive.  The maximum Gleason score was 3+4, and this was seen in the left base lateral.  Additionally, there was Gleason 3+3 disease in the left mid lateral and left apex lateral.  The patient reviewed the biopsy results with his urologist and he has kindly been referred today for discussion of potential radiation treatment options.  PREVIOUS RADIATION THERAPY: No  PAST MEDICAL HISTORY:  Past Medical History:  Diagnosis Date   HLD (hyperlipidemia)    Hypertension    Prostate cancer ([place])       PAST SURGICAL HISTORY: Past Surgical History:  Procedure Laterality Date    PROSTATE BIOPSY      FAMILY HISTORY:  Family History  Adopted: Yes  Problem Relation Age of Onset   Cancer Neg Hx     SOCIAL HISTORY:  He is single and lives alone in [place], [place] which is close to [place], [place].  Currently, he has a girlfriend and remains sexually active which is of high priority to him.  He continues working full-time in [employer].  Social History   Socioeconomic History   Marital status: Single    Spouse name: Not on file   Number of children: 1   Years of education: Not on file   Highest education level: Not on file  Occupational History    Comment: unemployed   Social Designer, fashion/clothing strain: Not on file   Food insecurity:    Worry: Not on file    Inability: Not on file   Transportation needs:    Medical: Not on file    Non-medical: Not on file  Tobacco Use   Smoking status: Never Smoker   Smokeless tobacco: Never Used  Substance and Sexual Activity   Alcohol use: Yes    Alcohol/week: 12.0 standard drinks    Types: 12 Cans of beer per week   Drug use: Never   Sexual activity: Yes  Lifestyle   Physical activity:    Days per week: Not on file    Minutes per session: Not on file   Stress: Not on file  Relationships  Social connections:    Talks on phone: Not on file    Gets together: Not on file    Attends religious service: Not on file    Active member of club or organization: Not on file    Attends meetings of clubs or organizations: Not on file    Relationship status: Not on file   Intimate partner violence:    Fear of current or ex partner: Not on file    Emotionally abused: Not on file    Physically abused: Not on file    Forced sexual activity: Not on file  Other Topics Concern   Not on file  Social History Narrative   Not on file    ALLERGIES: Patient has no known allergies.  MEDICATIONS:  Current Outpatient Medications  Medication Sig Dispense Refill   amLODipine (NORVASC) 10  MG tablet Take 1 tablet (10 mg total) by mouth daily. 90 tablet 2   atorvastatin (LIPITOR) 20 MG tablet Take 1 tablet (20 mg total) by mouth daily. 90 tablet 3   diclofenac (VOLTAREN) 50 MG EC tablet Take 50 mg by mouth 2 (two) times daily.     ketorolac (ACULAR) 0.5 % ophthalmic solution INSTILL 1 DROP INTO LEFT EYE 4 TIMES DAILY     tamsulosin (FLOMAX) 0.4 MG CAPS capsule Take 1 capsule (0.4 mg total) by mouth daily. (Patient not taking: Reported on 12/11/2018) 30 capsule 3   No current facility-administered medications for this encounter.     REVIEW OF SYSTEMS:  On review of systems, the patient reports that he is doing well overall. He denies any chest pain, shortness of breath, cough, fevers, chills, night sweats, unintended weight changes. He denies any bowel disturbances, and denies abdominal pain, nausea or vomiting. He denies any new musculoskeletal or joint aches or pains. His IPSS was 12, indicating mild-moderate urinary symptoms. His SHIM was 24, indicating he does not have erectile dysfunction which is quite important to him as he is most interested in maintaining sexual function following treatment. A complete review of systems is obtained and is otherwise negative.    PHYSICAL EXAM:  Wt Readings from Last 3 Encounters:  12/11/18 218 lb (98.9 kg)  09/24/18 214 lb (97.1 kg)  08/31/18 214 lb (97.1 kg)   Temp Readings from Last 3 Encounters:  No data found for Temp   BP Readings from Last 3 Encounters:  09/24/18 (!) 140/96  08/31/18 (!) 140/92  08/03/18 132/80   Pulse Readings from Last 3 Encounters:  09/24/18 81  08/31/18 97  08/03/18 88   Pain Assessment Pain Score: 0-No pain/10  Unable to assess due to telephone visit format for consult.Marland Kitchen  KPS = 90  100 - Normal; no complaints; no evidence of disease. 90   - Able to carry on normal activity; minor signs or symptoms of disease. 80   - Normal activity with effort; some signs or symptoms of disease. 48   - Cares  for self; unable to carry on normal activity or to do active work. 60   - Requires occasional assistance, but is able to care for most of his personal needs. 50   - Requires considerable assistance and frequent medical care. 58   - Disabled; requires special care and assistance. 70   - Severely disabled; hospital admission is indicated although death not imminent. 45   - Very sick; hospital admission necessary; active supportive treatment necessary. 10   - Moribund; fatal processes progressing rapidly. 0     -  Dead  Karnofsky DA, Abelmann Howell, Craver LS and Burchenal [facility] [phone]) The use of the nitrogen mustards in the palliative treatment of carcinoma: with particular reference to bronchogenic carcinoma Cancer 1 634-56  LABORATORY DATA:  Lab Results  Component Value Date   WBC 3.4 (L) 08/03/2018   HGB 14.2 08/03/2018   HCT 40.8 08/03/2018   MCV 87.0 08/03/2018   PLT 297 08/03/2018   Lab Results  Component Value Date   NA 139 08/03/2018   K 3.9 08/03/2018   CL 104 08/03/2018   CO2 22 08/03/2018   Lab Results  Component Value Date   ALT 13 08/03/2018   AST 16 08/03/2018   BILITOT 0.6 08/03/2018     RADIOGRAPHY: No results found.    IMPRESSION/PLAN:  Plan:  This visit was conducted via telephone to spare the patient unnecessary potential exposure in the healthcare setting during the current COVID-19 pandemic.   1. 68 y.o. gentleman with Stage T1c adenocarcinoma of the prostate with Gleason Score of 3+4, and PSA of 6.3. We discussed the patient's workup and outlined the nature of prostate cancer in this setting. The patient's T stage, Gleason's score, and PSA put him into the favorable intermediate risk group. Accordingly, he is eligible for a variety of potential treatment options including brachytherapy, 5.5 weeks of external radiation or prostatectomy. We discussed the available radiation techniques, and focused on the details and logistics and delivery. We discussed and outlined  the risks, benefits, short and long-term effects associated with radiotherapy and compared and contrasted these with prostatectomy. We discussed the role of SpaceOAR in reducing the rectal toxicity associated with radiotherapy.   At the end of the conversation the patient remains undecided regarding his final treatment preference but appears to be leaning towards towards brachytherapy versus prostatectomy and prefers to move forward with scheduling brachytherapy and use of SpaceOAR to reduce rectal toxicity from radiotherapy.   The patient understands that Romie Jumper, in our office, will be working closely with him to coordinate OR scheduling and pre and post procedure appointments.  We will contact the pharmaceutical rep to ensure that [place] is available at the time of procedure.  He will have a prostate MRI following his post-seed CT SIM to confirm appropriate distribution of the [place]. He understands that the timing of scheduling his procedure will depend on progress made in controlling the current COVID-19 pandemic which may result in scheduling delays for elective surgical procedures as we move forward.  We will remain in close communication with the patient to coordinate his CT simulation prior to treatment in the near future.  The patient appears to have a good understanding of his disease and our recommendations and is comfortable and in agreement with the stated plan.  We will share this information with Dr. Gloriann Loan and proceed with treatment planning accordingly.  Given current concerns for patient exposure during the COVID-19 pandemic, this encounter was conducted via telephone. The patient was notified in advance and was offered a [place] [employer] meeting to allow for face to face communication but unfortunately reported that he did not have the appropriate resources/technology to support such a visit and instead preferred to proceed with telephone consult. The patient has given verbal consent for this  type of encounter. The time spent during this encounter was 70 minutes and more than 50% of that time was spent in counseling and/or coordination of care. The attendants for this meeting include Tyler Pita MD, Ashlyn Bruning PA-C, patient Logan Orr and his daughter,  [place]. During the encounter, Tyler Pita MD and Freeman Caldron PA-C were located at [facility] Radiation Oncology Department.  Patient Logan Orr and his daughter, Logan Orr were located at home.    Nicholos Johns, PA-C    Tyler Pita, MD  [facility] Direct Dial: [phone]   Fax: [phone] [email or web address]   Skype   LinkedIn

## 2018-12-18 ENCOUNTER — Telehealth: Payer: Self-pay | Admitting: Medical Oncology

## 2018-12-18 ENCOUNTER — Encounter: Payer: Self-pay | Admitting: Medical Oncology

## 2018-12-18 NOTE — Progress Notes (Signed)
Opened in error

## 2018-12-18 NOTE — Telephone Encounter (Signed)
Left message to introduce myself as the prostate nurse navigator and my role. I was unable to meet Logan Orr on 4/21 when he consulted with Dr. Tammi Klippel, due to visit being conducted via WebEx due to current COVID-19 concerns for limiting patient exposure.   Per Dr. Johny Shears note he is considering surgery or seed implant but leaning towards seed implant. I informed him that I am working remotely and asked him to call me questions or concerns or if he needs more information about treatments and I will be happy to return his call.

## 2018-12-24 ENCOUNTER — Other Ambulatory Visit: Payer: Self-pay | Admitting: Urology

## 2018-12-24 ENCOUNTER — Telehealth: Payer: Self-pay | Admitting: *Deleted

## 2018-12-24 NOTE — Telephone Encounter (Signed)
CALLED PATIENT TO INFORM OF PRE-SEED PLANNING CT FOR 01-31-19 AND HIS IMPLANT FOR 03-06-19, LVM FOR A RETURN CALL

## 2019-01-01 ENCOUNTER — Other Ambulatory Visit: Payer: Self-pay | Admitting: Urology

## 2019-01-01 DIAGNOSIS — C61 Malignant neoplasm of prostate: Secondary | ICD-10-CM

## 2019-01-02 ENCOUNTER — Telehealth: Payer: Self-pay | Admitting: *Deleted

## 2019-01-02 NOTE — Telephone Encounter (Signed)
Called patient to inform of pre-seed planning and chest x-ray and EKG for 01-11-19 and his implant for 02-04-19, spoke with patient and he is aware of these appts.

## 2019-01-10 ENCOUNTER — Other Ambulatory Visit: Payer: Self-pay | Admitting: Urology

## 2019-01-10 ENCOUNTER — Telehealth: Payer: Self-pay | Admitting: *Deleted

## 2019-01-10 NOTE — Telephone Encounter (Signed)
Called patient to remind of pre-seed appts. for 01-11-19, lvm for a return call

## 2019-01-10 NOTE — Progress Notes (Signed)
  Radiation Oncology         [phone]) [phone] ________________________________  Name: Logan Orr MRN: [id number]  Date: 01/11/2019  DOB: 04/09/1951  SIMULATION AND TREATMENT PLANNING NOTE PUBIC ARCH STUDY  [id number], Mortimer Fries, MD  Libby Maw,*  DIAGNOSIS: 68 y.o. gentleman with Stage T1c adenocarcinoma of the prostate with Gleason score of 3+4, and PSA of 6.3     ICD-10-CM   1. Malignant neoplasm of prostate ([place]) C61     COMPLEX SIMULATION:  The patient presented today for evaluation for possible prostate seed implant. He was brought to the radiation planning suite and placed supine on the CT couch. A 3-dimensional image study set was obtained in upload to the planning computer. There, on each axial slice, I contoured the prostate gland. Then, using three-dimensional radiation planning tools I reconstructed the prostate in view of the structures from the transperineal needle pathway to assess for possible pubic arch interference. In doing so, I did not appreciate any pubic arch interference. Also, the patient's prostate volume was estimated based on the drawn structure. The volume was 38.7 cc.  Given the pubic arch appearance and prostate volume, patient remains a good candidate to proceed with prostate seed implant. Today, he freely provided informed written consent to proceed.    PLAN: The patient will undergo prostate seed implant.   ________________________________  Sheral Apley. Tammi Klippel, M.D.

## 2019-01-11 ENCOUNTER — Ambulatory Visit
Admission: RE | Admit: 2019-01-11 | Discharge: 2019-01-11 | Disposition: A | Discharge status: Home / Self Care | Payer: Medicare HMO | Source: Ambulatory Visit | Attending: Urology | Admitting: Urology

## 2019-01-11 ENCOUNTER — Other Ambulatory Visit: Payer: Self-pay

## 2019-01-11 ENCOUNTER — Encounter (HOSPITAL_COMMUNITY)
Admission: RE | Admit: 2019-01-11 | Discharge: 2019-01-11 | Disposition: A | Discharge status: Home / Self Care | Payer: Medicare HMO | Source: Ambulatory Visit | Attending: Urology | Admitting: Urology

## 2019-01-11 ENCOUNTER — Ambulatory Visit (HOSPITAL_COMMUNITY)
Admission: RE | Admit: 2019-01-11 | Discharge: 2019-01-11 | Disposition: A | Discharge status: Home / Self Care | Payer: Medicare HMO | Source: Ambulatory Visit | Attending: Urology | Admitting: Urology

## 2019-01-11 VITALS — BP 129/87 | HR 95 | Temp 97.6°F | Resp 20 | Ht 69.0 in | Wt 208.4 lb

## 2019-01-11 DIAGNOSIS — C61 Malignant neoplasm of prostate: Secondary | ICD-10-CM | POA: Diagnosis not present

## 2019-01-11 DIAGNOSIS — Z510 Encounter for antineoplastic radiation therapy: Secondary | ICD-10-CM | POA: Insufficient documentation

## 2019-01-23 ENCOUNTER — Telehealth: Payer: Self-pay | Admitting: *Deleted

## 2019-01-23 NOTE — Telephone Encounter (Signed)
Returned patient's phone call, spoke with patient 

## 2019-01-25 ENCOUNTER — Telehealth: Payer: Self-pay | Admitting: *Deleted

## 2019-01-25 NOTE — Telephone Encounter (Signed)
CALLED PATIENT TO INFORM OF LAB APPT. ON 01-31-19 - ARRIVAL TIME- 9:30 AM @ [facility] ADMITTING, SPOKE WITH PATIENT AND HE IS AWARE OF THIS APPT.

## 2019-01-28 ENCOUNTER — Encounter (HOSPITAL_COMMUNITY): Payer: Medicare HMO

## 2019-01-30 ENCOUNTER — Encounter (HOSPITAL_BASED_OUTPATIENT_CLINIC_OR_DEPARTMENT_OTHER): Payer: Self-pay

## 2019-01-31 ENCOUNTER — Inpatient Hospital Stay (HOSPITAL_COMMUNITY): Admission: RE | Admit: 2019-01-31 | Payer: Medicare HMO | Source: Ambulatory Visit

## 2019-01-31 ENCOUNTER — Ambulatory Visit: Payer: Self-pay | Admitting: Urology

## 2019-01-31 ENCOUNTER — Encounter (HOSPITAL_COMMUNITY)
Admission: RE | Admit: 2019-01-31 | Discharge: 2019-01-31 | Disposition: A | Discharge status: Home / Self Care | Payer: Medicare HMO | Source: Ambulatory Visit | Attending: Urology | Admitting: Urology

## 2019-01-31 ENCOUNTER — Other Ambulatory Visit: Payer: Self-pay

## 2019-01-31 ENCOUNTER — Ambulatory Visit: Payer: Medicare HMO | Admitting: Radiation Oncology

## 2019-01-31 DIAGNOSIS — Z01812 Encounter for preprocedural laboratory examination: Secondary | ICD-10-CM | POA: Insufficient documentation

## 2019-01-31 DIAGNOSIS — Z1159 Encounter for screening for other viral diseases: Secondary | ICD-10-CM | POA: Insufficient documentation

## 2019-01-31 LAB — CBC
HCT: 40.6 % (ref 39.0–52.0)
Hemoglobin: 13.3 g/dL (ref 13.0–17.0)
MCH: 30.2 pg (ref 26.0–34.0)
MCHC: 32.8 g/dL (ref 30.0–36.0)
MCV: 92.3 fL (ref 80.0–100.0)
Platelets: 286 10*3/uL (ref 150–400)
RBC: 4.4 MIL/uL (ref 4.22–5.81)
RDW: 13.3 % (ref 11.5–15.5)
WBC: 4 10*3/uL (ref 4.0–10.5)
nRBC: 0 % (ref 0.0–0.2)

## 2019-01-31 LAB — COMPREHENSIVE METABOLIC PANEL
ALT: 25 U/L (ref 0–44)
AST: 32 U/L (ref 15–41)
Albumin: 4.3 g/dL (ref 3.5–5.0)
Alkaline Phosphatase: 78 U/L (ref 38–126)
Anion gap: 10 (ref 5–15)
BUN: 20 mg/dL (ref 8–23)
CO2: 25 mmol/L (ref 22–32)
Calcium: 9.2 mg/dL (ref 8.9–10.3)
Chloride: 102 mmol/L (ref 98–111)
Creatinine, Ser: 1.4 mg/dL — ABNORMAL HIGH (ref 0.61–1.24)
GFR calc Af Amer: 60 mL/min — ABNORMAL LOW (ref >60)
GFR calc non Af Amer: 52 mL/min — ABNORMAL LOW (ref >60)
Glucose, Bld: 100 mg/dL — ABNORMAL HIGH (ref 70–99)
Potassium: 3.9 mmol/L (ref 3.5–5.1)
Sodium: 137 mmol/L (ref 135–145)
Total Bilirubin: 0.6 mg/dL (ref 0.3–1.2)
Total Protein: 7.5 g/dL (ref 6.5–8.1)

## 2019-01-31 LAB — PROTIME-INR
INR: 1 (ref 0.8–1.2)
Prothrombin Time: 13 seconds (ref 11.4–15.2)

## 2019-01-31 LAB — APTT: aPTT: 31 seconds (ref 24–36)

## 2019-02-01 ENCOUNTER — Other Ambulatory Visit (HOSPITAL_COMMUNITY)
Admission: RE | Admit: 2019-02-01 | Discharge: 2019-02-01 | Disposition: A | Discharge status: Home / Self Care | Payer: Medicare HMO | Source: Ambulatory Visit | Attending: Urology | Admitting: Urology

## 2019-02-01 ENCOUNTER — Encounter (HOSPITAL_BASED_OUTPATIENT_CLINIC_OR_DEPARTMENT_OTHER): Payer: Self-pay | Admitting: *Deleted

## 2019-02-01 ENCOUNTER — Telehealth: Payer: Self-pay | Admitting: *Deleted

## 2019-02-01 ENCOUNTER — Other Ambulatory Visit: Payer: Self-pay

## 2019-02-01 DIAGNOSIS — Z01812 Encounter for preprocedural laboratory examination: Secondary | ICD-10-CM | POA: Diagnosis not present

## 2019-02-01 NOTE — Telephone Encounter (Signed)
CALLED PATIENT TO REMIND OF PROCEDURE FOR 02-04-19, LVM FOR A RETURN CALL

## 2019-02-01 NOTE — Progress Notes (Signed)
Spoke w/ pt via phone for pre-op interview.  Npo after mn.  Arrive at 0900.  Will do fleet anema am dos.  Will take lipitor, norvasc, and flomax am dos w/ sips of water.  Current lab work (cbc, cmp, pt, ptt) dated 01-31-2019 in chart and epic.  Current ekg and cxr in chart and epic.  Pt had covid test done 01-31-2019.

## 2019-02-01 NOTE — Progress Notes (Signed)

## 2019-02-02 LAB — NOVEL CORONAVIRUS, NAA (HOSP ORDER, SEND-OUT TO REF LAB; TAT 18-24 HRS): SARS-CoV-2, NAA: NOT DETECTED

## 2019-02-04 ENCOUNTER — Ambulatory Visit (HOSPITAL_BASED_OUTPATIENT_CLINIC_OR_DEPARTMENT_OTHER): Payer: Medicare HMO | Admitting: Anesthesiology

## 2019-02-04 ENCOUNTER — Ambulatory Visit (HOSPITAL_BASED_OUTPATIENT_CLINIC_OR_DEPARTMENT_OTHER): Payer: Medicare HMO | Admitting: Physician Assistant

## 2019-02-04 ENCOUNTER — Ambulatory Visit (HOSPITAL_COMMUNITY): Payer: Medicare HMO

## 2019-02-04 ENCOUNTER — Encounter (HOSPITAL_BASED_OUTPATIENT_CLINIC_OR_DEPARTMENT_OTHER): Payer: Self-pay

## 2019-02-04 ENCOUNTER — Encounter (HOSPITAL_BASED_OUTPATIENT_CLINIC_OR_DEPARTMENT_OTHER)
Admission: RE | Disposition: A | Discharge status: Home / Self Care | Payer: Self-pay | Source: Home / Self Care | Attending: Urology

## 2019-02-04 ENCOUNTER — Ambulatory Visit (HOSPITAL_BASED_OUTPATIENT_CLINIC_OR_DEPARTMENT_OTHER)
Admission: RE | Admit: 2019-02-04 | Discharge: 2019-02-04 | Disposition: A | Discharge status: Home / Self Care | Payer: Medicare HMO | Attending: Urology | Admitting: Urology

## 2019-02-04 ENCOUNTER — Encounter: Payer: Self-pay | Admitting: Medical Oncology

## 2019-02-04 DIAGNOSIS — M1990 Unspecified osteoarthritis, unspecified site: Secondary | ICD-10-CM | POA: Diagnosis not present

## 2019-02-04 DIAGNOSIS — C61 Malignant neoplasm of prostate: Secondary | ICD-10-CM | POA: Diagnosis not present

## 2019-02-04 DIAGNOSIS — N401 Enlarged prostate with lower urinary tract symptoms: Secondary | ICD-10-CM | POA: Diagnosis not present

## 2019-02-04 DIAGNOSIS — R3912 Poor urinary stream: Secondary | ICD-10-CM | POA: Diagnosis not present

## 2019-02-04 DIAGNOSIS — Z79899 Other long term (current) drug therapy: Secondary | ICD-10-CM | POA: Diagnosis not present

## 2019-02-04 DIAGNOSIS — E7800 Pure hypercholesterolemia, unspecified: Secondary | ICD-10-CM | POA: Insufficient documentation

## 2019-02-04 DIAGNOSIS — R351 Nocturia: Secondary | ICD-10-CM | POA: Diagnosis not present

## 2019-02-04 DIAGNOSIS — I10 Essential (primary) hypertension: Secondary | ICD-10-CM | POA: Diagnosis not present

## 2019-02-04 DIAGNOSIS — R350 Frequency of micturition: Secondary | ICD-10-CM | POA: Insufficient documentation

## 2019-02-04 HISTORY — PX: SPACE OAR INSTILLATION: SHX6769

## 2019-02-04 HISTORY — PX: RADIOACTIVE SEED IMPLANT: SHX5150

## 2019-02-04 HISTORY — DX: Nontoxic single thyroid nodule: E04.1

## 2019-02-04 HISTORY — DX: Unspecified hemorrhoids: K64.9

## 2019-02-04 HISTORY — DX: Presence of spectacles and contact lenses: Z97.3

## 2019-02-04 HISTORY — DX: Benign prostatic hyperplasia with lower urinary tract symptoms: N40.1

## 2019-02-04 HISTORY — DX: Personal history of colon polyps, unspecified: Z86.0100

## 2019-02-04 HISTORY — PX: CYSTOSCOPY: SHX5120

## 2019-02-04 HISTORY — DX: Personal history of colonic polyps: Z86.010

## 2019-02-04 SURGERY — INSERTION, RADIATION SOURCE, PROSTATE
Anesthesia: General | Site: Rectum

## 2019-02-04 MED ORDER — ONDANSETRON HCL 4 MG/2ML IJ SOLN
4.0000 mg | Freq: Four times a day (QID) | INTRAMUSCULAR | Status: DC | PRN
  Filled 2019-02-04: qty 2

## 2019-02-04 MED ORDER — DEXAMETHASONE SODIUM PHOSPHATE 10 MG/ML IJ SOLN
INTRAMUSCULAR | Status: DC | PRN
  Administered 2019-02-04: 10 mg via INTRAVENOUS

## 2019-02-04 MED ORDER — SODIUM CHLORIDE (PF) 0.9 % IJ SOLN
INTRAMUSCULAR | Status: DC | PRN
  Administered 2019-02-04: 10 mL

## 2019-02-04 MED ORDER — FENTANYL CITRATE (PF) 100 MCG/2ML IJ SOLN
INTRAMUSCULAR | Status: AC
  Filled 2019-02-04: qty 2

## 2019-02-04 MED ORDER — PROPOFOL 10 MG/ML IV BOLUS
INTRAVENOUS | Status: DC | PRN
  Administered 2019-02-04: 180 mg via INTRAVENOUS
  Administered 2019-02-04 (×2): 20 mg via INTRAVENOUS

## 2019-02-04 MED ORDER — LIDOCAINE 2% (20 MG/ML) 5 ML SYRINGE
INTRAMUSCULAR | Status: DC | PRN
  Administered 2019-02-04: 100 mg via INTRAVENOUS

## 2019-02-04 MED ORDER — FLEET ENEMA 7-19 GM/118ML RE ENEM
1.0000 | ENEMA | Freq: Once | RECTAL | Status: DC
  Filled 2019-02-04: qty 1

## 2019-02-04 MED ORDER — OXYCODONE HCL 5 MG PO TABS
5.0000 mg | ORAL_TABLET | Freq: Once | ORAL | Status: DC | PRN
  Filled 2019-02-04: qty 1

## 2019-02-04 MED ORDER — CIPROFLOXACIN IN D5W 400 MG/200ML IV SOLN
INTRAVENOUS | Status: AC
  Filled 2019-02-04: qty 200

## 2019-02-04 MED ORDER — OXYCODONE HCL 5 MG/5ML PO SOLN
5.0000 mg | Freq: Once | ORAL | Status: DC | PRN
  Filled 2019-02-04: qty 5

## 2019-02-04 MED ORDER — DEXAMETHASONE SODIUM PHOSPHATE 10 MG/ML IJ SOLN
INTRAMUSCULAR | Status: AC
  Filled 2019-02-04: qty 1

## 2019-02-04 MED ORDER — FLEET ENEMA 7-19 GM/118ML RE ENEM
1.0000 | ENEMA | Freq: Once | RECTAL | Status: AC
  Administered 2019-02-04: 1 via RECTAL
  Filled 2019-02-04 (×2): qty 1

## 2019-02-04 MED ORDER — IOHEXOL 300 MG/ML  SOLN
INTRAMUSCULAR | Status: DC | PRN
  Administered 2019-02-04: 7 mL

## 2019-02-04 MED ORDER — LIDOCAINE 2% (20 MG/ML) 5 ML SYRINGE
INTRAMUSCULAR | Status: AC
  Filled 2019-02-04: qty 5

## 2019-02-04 MED ORDER — ONDANSETRON HCL 4 MG/2ML IJ SOLN
INTRAMUSCULAR | Status: DC | PRN
  Administered 2019-02-04: 4 mg via INTRAVENOUS

## 2019-02-04 MED ORDER — MIDAZOLAM HCL 5 MG/5ML IJ SOLN
INTRAMUSCULAR | Status: DC | PRN
  Administered 2019-02-04: 2 mg via INTRAVENOUS

## 2019-02-04 MED ORDER — SODIUM CHLORIDE 0.9 % IR SOLN
Status: DC | PRN
  Administered 2019-02-04: 1000 mL via INTRAVESICAL

## 2019-02-04 MED ORDER — FENTANYL CITRATE (PF) 100 MCG/2ML IJ SOLN
25.0000 ug | INTRAMUSCULAR | Status: DC | PRN
  Administered 2019-02-04 (×2): 25 ug via INTRAVENOUS
  Filled 2019-02-04: qty 1

## 2019-02-04 MED ORDER — CIPROFLOXACIN IN D5W 400 MG/200ML IV SOLN
400.0000 mg | INTRAVENOUS | Status: AC
  Administered 2019-02-04: 400 mg via INTRAVENOUS
  Filled 2019-02-04: qty 200

## 2019-02-04 MED ORDER — LACTATED RINGERS IV SOLN
INTRAVENOUS | Status: DC
  Administered 2019-02-04: 10:00:00 via INTRAVENOUS
  Filled 2019-02-04: qty 1000

## 2019-02-04 MED ORDER — ONDANSETRON HCL 4 MG/2ML IJ SOLN
INTRAMUSCULAR | Status: AC
  Filled 2019-02-04: qty 2

## 2019-02-04 MED ORDER — PROPOFOL 10 MG/ML IV BOLUS
INTRAVENOUS | Status: AC
  Filled 2019-02-04: qty 20

## 2019-02-04 MED ORDER — STERILE WATER FOR INJECTION IJ SOLN
INTRAMUSCULAR | Status: DC | PRN
  Administered 2019-02-04: 3 mL

## 2019-02-04 MED ORDER — MIDAZOLAM HCL 2 MG/2ML IJ SOLN
INTRAMUSCULAR | Status: AC
  Filled 2019-02-04: qty 2

## 2019-02-04 MED ORDER — FENTANYL CITRATE (PF) 100 MCG/2ML IJ SOLN
INTRAMUSCULAR | Status: DC | PRN
  Administered 2019-02-04 (×2): 25 ug via INTRAVENOUS
  Administered 2019-02-04: 50 ug via INTRAVENOUS

## 2019-02-04 MED ORDER — HYDROCODONE-ACETAMINOPHEN 5-325 MG PO TABS: 1.0000 | ORAL_TABLET | ORAL | 0 refills | Status: DC | PRN

## 2019-02-04 SURGICAL SUPPLY — 43 items
BAG URINE DRAINAGE (UROLOGICAL SUPPLIES) ×4 IMPLANT
BLADE CLIPPER SENSICLIP SURGIC (BLADE) ×4 IMPLANT
CATH FOLEY 2WAY SLVR  5CC 16FR (CATHETERS) ×1
CATH FOLEY 2WAY SLVR 5CC 16FR (CATHETERS) ×3 IMPLANT
CATH ROBINSON RED A/P 16FR (CATHETERS) IMPLANT
CATH ROBINSON RED A/P 20FR (CATHETERS) ×4 IMPLANT
CLOTH BEACON ORANGE TIMEOUT ST (SAFETY) ×4 IMPLANT
CONT SPECI 4OZ STER CLIK (MISCELLANEOUS) ×8 IMPLANT
COVER BACK TABLE 60X90IN (DRAPES) ×4 IMPLANT
COVER MAYO STAND STRL (DRAPES) ×4 IMPLANT
COVER WAND RF STERILE (DRAPES) ×4 IMPLANT
DRSG TEGADERM 4X4.75 (GAUZE/BANDAGES/DRESSINGS) ×4 IMPLANT
DRSG TEGADERM 8X12 (GAUZE/BANDAGES/DRESSINGS) ×4 IMPLANT
GAUZE SPONGE 4X4 12PLY STRL LF (GAUZE/BANDAGES/DRESSINGS) ×4 IMPLANT
GLOVE BIO SURGEON STRL SZ 6 (GLOVE) IMPLANT
GLOVE BIO SURGEON STRL SZ 6.5 (GLOVE) ×4 IMPLANT
GLOVE BIO SURGEON STRL SZ7 (GLOVE) IMPLANT
GLOVE BIO SURGEON STRL SZ7.5 (GLOVE) ×4 IMPLANT
GLOVE BIO SURGEON STRL SZ8 (GLOVE) IMPLANT
GLOVE BIOGEL PI IND STRL 6 (GLOVE) IMPLANT
GLOVE BIOGEL PI IND STRL 6.5 (GLOVE) IMPLANT
GLOVE BIOGEL PI IND STRL 7.0 (GLOVE) IMPLANT
GLOVE BIOGEL PI IND STRL 8 (GLOVE) IMPLANT
GLOVE BIOGEL PI INDICATOR 6 (GLOVE)
GLOVE BIOGEL PI INDICATOR 6.5 (GLOVE)
GLOVE BIOGEL PI INDICATOR 7.0 (GLOVE)
GLOVE BIOGEL PI INDICATOR 8 (GLOVE)
GLOVE ECLIPSE 8.0 STRL XLNG CF (GLOVE) ×4 IMPLANT
GOWN STRL REUS W/TWL LRG LVL3 (GOWN DISPOSABLE) ×4 IMPLANT
GOWN STRL REUS W/TWL XL LVL3 (GOWN DISPOSABLE) ×4 IMPLANT
HOLDER FOLEY CATH W/STRAP (MISCELLANEOUS) IMPLANT
I-Seed AgX100 ×360 IMPLANT
IMPL SPACEOAR SYSTEM 10ML (Spacer) ×3 IMPLANT
IMPLANT SPACEOAR SYSTEM 10ML (Spacer) ×4 IMPLANT
IV SOD CHL 0.9% 1000ML (IV SOLUTION) ×4 IMPLANT
KIT TURNOVER CYSTO (KITS) ×4 IMPLANT
MARKER SKIN DUAL TIP RULER LAB (MISCELLANEOUS) ×4 IMPLANT
PACK CYSTO (CUSTOM PROCEDURE TRAY) ×4 IMPLANT
SURGILUBE 2OZ TUBE FLIPTOP (MISCELLANEOUS) ×4 IMPLANT
SUT BONE WAX W31G (SUTURE) IMPLANT
SYR 10ML LL (SYRINGE) ×4 IMPLANT
UNDERPAD 30X30 (UNDERPADS AND DIAPERS) ×8 IMPLANT
WATER STERILE IRR 500ML POUR (IV SOLUTION) ×4 IMPLANT

## 2019-02-04 NOTE — Discharge Instructions (Addendum)
Antibiotics °You may be given a prescription for an antibiotic to take when you arrive home. If so, be sure to take every tablet in the bottle, even if you are feeling better before the prescription is finished. If you begin itching, notice a rash or start to swell on your trunk, arms, legs and/or throat, immediately stop taking the antibiotic and call your Urologist. °Diet °Resume your usual diet when you return home. To keep your bowels moving easily and softly, drink prune, apple and cranberry juice at room temperature. You may also take a stool softener, such as Colace, which is available without prescription at local pharmacies. °Daily activities °? No driving or heavy lifting for at least two days after the implant. °? No bike riding, horseback riding or riding lawn mowers for the first month after the implant. °? Any strenuous physical activity should be approved by your doctor before you resume it. °Sexual relations °You may resume sexual relations two weeks after the procedure. A condom should be used for the first two weeks. Your semen may be dark brown or black; this is normal and is related bleeding that may have occurred during the implant. °Postoperative swelling °Expect swelling and bruising of the scrotum and perineum (the area between the scrotum and anus). Both the swelling and the bruising should resolve in l or 2 weeks. Ice packs and over- the-counter medications such as Tylenol, Advil or Aleve may lessen your discomfort. °Postoperative urination °Most men experience burning on urination and/or urinary frequency. If this becomes bothersome, contact your Urologist.  Medication can be prescribed to relieve these problems.  It is normal to have some blood in your urine for a few days after the implant. °Special instructions related to the seeds °It is unlikely that you will pass an Iodine-125 seed in your urine. The seeds are silver in color and are about as large as a grain of rice. If you pass a  seed, do not handle it with your fingers. Use a spoon to place it in an envelope or jar in place this in base occluded area such as the garage or basement for return to the radiation clinic at your convenience. ° °Contact your doctor for °? Temperature greater than 101 F °? Increasing pain °? Inability to urinate °Follow-up ° You should have follow up with your urologist and radiation oncologist about 3 weeks after the procedure. °General information regarding Iodine seeds °? Iodine-125 is a low energy radioactive material. It is not deeply penetrating and loses energy at short distances. Your prostate will absorb the radiation. Objects that are touched or used by the patient do not become radioactive. °? Body wastes (urine and stool) or body fluids (saliva, tears, semen or blood) are not radioactive. °? The Nuclear Regulatory Commission (NRC) has determined that no radiation precautions are needed for patients undergoing Iodine-125 seed implantation. The NRC states that such patients do not present a risk to the people around them, including young children and pregnant women. However, in keeping with the general principle that radiation exposure should be kept as low reasonably possible, we suggest the following: °? Children and pets should not sit on the patient's lap for the first two (2) weeks after the implant. °? Pregnant (or possibly pregnant) women should avoid prolonged, close contact with the patient for the first two (2) weeks after the implant. °? A distance of three (3) feet is acceptable. °? At a distance of three (3) feet, there is no limit to the   length of time anyone can be with the patient. ° ° ° °Post Anesthesia Home Care Instructions ° °Activity: °Get plenty of rest for the remainder of the day. A responsible individual must stay with you for 24 hours following the procedure.  °For the next 24 hours, DO NOT: °-Drive a car °-Operate machinery °-Drink alcoholic beverages °-Take any medication  unless instructed by your physician °-Make any legal decisions or sign important papers. ° °Meals: °Start with liquid foods such as gelatin or soup. Progress to regular foods as tolerated. Avoid greasy, spicy, heavy foods. If nausea and/or vomiting occur, drink only clear liquids until the nausea and/or vomiting subsides. Call your physician if vomiting continues. ° °Special Instructions/Symptoms: °Your throat may feel dry or sore from the anesthesia or the breathing tube placed in your throat during surgery. If this causes discomfort, gargle with warm salt water. The discomfort should disappear within 24 hours. ° °If you had a scopolamine patch placed behind your ear for the management of post- operative nausea and/or vomiting: ° °1. The medication in the patch is effective for 72 hours, after which it should be removed.  Wrap patch in a tissue and discard in the trash. Wash hands thoroughly with soap and water. °2. You may remove the patch earlier than 72 hours if you experience unpleasant side effects which may include dry mouth, dizziness or visual disturbances. °3. Avoid touching the patch. Wash your hands with soap and water after contact with the patch. °  ° ° ° °

## 2019-02-04 NOTE — Progress Notes (Signed)
SPOKE W/  _ [place] [facility]:    COUGH-- no  RUNNY NOSE--- no SORE THROAT--- no  NASAL CONGESTION---- no  SNEEZING---- no  SHORTNESS OF BREATH--- no  DIFFICULTY BREATHING--- no  TEMP >100.0 ----- no  UNEXPLAINED BODY ACHES------ no  CHILLS -------- no  HEADACHES --------- no  LOSS OF SMELL/ TASTE -------- no   HAVE YOU OR ANY FAMILY MEMBER TRAVELLED PAST 14 DAYS OUT OF THE   COUNTY--- no STATE---- nono COUNTRY----  HAVE YOU OR ANY FAMILY MEMBER BEEN EXPOSED TO ANYONE WITH COVID 19?

## 2019-02-04 NOTE — H&P (Signed)
CC/HPI: Logan Orr presents today for pre-operative history and physical exam in anticipation of brachytherapy and space oar placement on 02/04/19 by Dr. Gloriann Loan. He is doing well and is without complaint. He plans to travel to [place] the week after his procedure. Logan Orr denies F/C, HA, CP, SOB, N/V, diarrhea/constipation, back pain, flank pain, hematuria, and dysuria.    HX:   Cc: Elevated PSA  HPI:  10/24/2018  Patient is referred for a PSA of 5.8 on 08/03/2018. He denies having previous values that were elevated. He does not think he is had it rechecked. He is adopted and therefore unsure of family history. He denies hematuria or dysuria. Urinalysis is negative today. He denies a history of urinary tract infections or prostatitis. No voiding complaints on tamsulosin.   11/23/2018  Patient presents today for prostate biopsy.   11/29/2018  Patient is status post prostate biopsy. Unfortunately, biopsy results revealed adenocarcinoma the prostate in 3 out of 12 cores. Maximum volume was 30%. Maximum Gleason score was 3+4 in 30% of one core. Other 2 biopsy specimens were 3+3 with 10% involvement. All cores were located on the left He has had no problems with prostate biopsy.   TRUS size 38.58 g  No erectile dysfunction  PSA 6.31  He has had no abdominal surgeries  Lower urinary tract symptoms including weak stream, frequency, nocturia. He is not on Flomax and desires to try this.  Digital rectal exam revealed no nodularity.     ALLERGIES: None   MEDICATIONS: Tamsulosin Hcl 0.4 mg capsule 1 capsule PO Daily  Amlodipine Besylate 10 mg tablet  Atorvastatin Calcium 20 mg tablet  Diclofenac Sodium 50 mg tablet, delayed release  Melatonin 3 mg capsule     GU PSH: Prostate Needle Biopsy - 11/23/2018      PSH Notes: laceration left forearm   NON-GU PSH: Surgical Pathology, Gross And Microscopic Examination For Prostate Needle - 11/23/2018    GU PMH: Prostate Cancer - 11/29/2018 Urinary Urgency - 11/29/2018 Weak  Urinary Stream - 11/29/2018 BPH w/o LUTS - 10/24/2018 Elevated PSA - 10/24/2018    NON-GU PMH: Arthritis Gout Hypercholesterolemia Hypertension    FAMILY HISTORY: Unknown family medical history - Runs in Family    Notes: Logan Orr adopted  1 brother with CaP   SOCIAL HISTORY: Marital Status: Single Preferred Language: English; Race: Black or African American Current Smoking Status: Patient has never smoked.   Tobacco Use Assessment Completed: Used Tobacco in last 30 days? Does not use smokeless tobacco. Does drink.  Does not use drugs. Drinks 1 caffeinated drink per day. Has not had a blood transfusion. Patient's occupation Optometrist.     Notes: 1 daughter  Beer once every 2-3 months    REVIEW OF SYSTEMS:    GU Review Male:   Patient reports trouble starting your stream. Patient denies frequent urination, hard to postpone urination, burning/ pain with urination, get up at night to urinate, leakage of urine, stream starts and stops, have to strain to urinate , erection problems, and penile pain.  Gastrointestinal (Upper):   Patient denies nausea, vomiting, and indigestion/ heartburn.  Gastrointestinal (Lower):   Patient denies diarrhea and constipation.  Constitutional:   Patient denies fever, night sweats, weight loss, and fatigue.  Skin:   Patient denies skin rash/ lesion and itching.  Eyes:   Patient denies blurred vision and double vision.  Ears/ Nose/ Throat:   Patient denies sore throat and sinus problems.  Hematologic/Lymphatic:   Patient denies swollen glands and easy  bruising.  Cardiovascular:   Patient denies leg swelling and chest pains.  Respiratory:   Patient denies cough and shortness of breath.  Endocrine:   Patient denies excessive thirst.  Musculoskeletal:   Patient reports back pain. Patient denies joint pain.  Neurological:   Patient denies headaches and dizziness.  Psychologic:   Patient denies depression and anxiety.   VITAL SIGNS:      01/29/2019 01:55 PM   Weight 214 lb / 97.07 kg  Height 69 in / 175.26 cm  BP 128/82 mmHg  Pulse 69 /min  Temperature 98.4 F / 36.8 C  BMI 31.6 kg/m   MULTI-SYSTEM PHYSICAL EXAMINATION:    Constitutional: Well-nourished. No physical deformities. Normally developed. Good grooming.  Neck: Neck symmetrical, not swollen. Normal tracheal position.  Respiratory: Normal breath sounds. No labored breathing, no use of accessory muscles.   Cardiovascular: Regular rate and rhythm. No murmur, no gallop.   Lymphatic: No enlargement of neck, axillae, groin.  Skin: No paleness, no jaundice, no cyanosis. No lesion, no ulcer, no rash.  Neurologic / Psychiatric: Oriented to time, oriented to place, oriented to person. No depression, no anxiety, no agitation.  Gastrointestinal: No mass, no tenderness, no rigidity, non obese abdomen.  Eyes: Normal conjunctivae. Normal eyelids.  Ears, Nose, Mouth, and Throat: Left ear no scars, no lesions, no masses. Right ear no scars, no lesions, no masses. Nose no scars, no lesions, no masses. Normal hearing. Normal lips.  Musculoskeletal: Normal gait and station of head and neck.     PAST DATA REVIEWED:  Source Of History:  Patient  Records Review:   Previous Patient Records  Urine Test Review:   Urinalysis   10/24/18  PSA  Total PSA 6.31 ng/mL    01/29/19  Urinalysis  Urine Appearance Clear   Urine Color Yellow   Urine Glucose Neg mg/dL  Urine Bilirubin Neg mg/dL  Urine Ketones Neg mg/dL  Urine Specific Gravity 1.025   Urine Blood Neg ery/uL  Urine pH 6.0   Urine Protein Neg mg/dL  Urine Urobilinogen 0.2 mg/dL  Urine Nitrites Neg   Urine Leukocyte Esterase Neg leu/uL   PROCEDURES:          Urinalysis - 81003 Dipstick Dipstick Cont'd  Color: Yellow Bilirubin: Neg mg/dL  Appearance: Clear Ketones: Neg mg/dL  Specific Gravity: 1.025 Blood: Neg ery/uL  pH: 6.0 Protein: Neg mg/dL  Glucose: Neg mg/dL Urobilinogen: 0.2 mg/dL    Nitrites: Neg    Leukocyte Esterase: Neg  leu/uL    ASSESSMENT:      ICD-10 Details  1 GU:   Prostate Cancer - C61    PLAN:           Schedule Return Visit/Planned Activity: Keep Scheduled Appointment - Schedule Surgery          Document Letter(s):  Created for Patient: Clinical Summary         Notes:   There are no changes in the patients history or physical exam since last evaluation by Dr. Gloriann Loan. Logan Orr is scheduled to undergo brachytherapy and space oar placement on 02/04/19.   All Logan Orr's questions were answered to the best of my ability.          Next Appointment:      Next Appointment: 02/04/2019 11:00 AM    Appointment Type: Surgery     Location: [facility] [phone]    Provider: Link Snuffer, III, M.D.    Reason for Visit: OP Drew  Signed by Mcarthur Rossetti, PA on 01/29/19 at 2:18 PM (EDT

## 2019-02-04 NOTE — Progress Notes (Signed)
  Radiation Oncology         [phone] ________________________________  Name: Logan Orr MRN: [id number]  Date: 02/04/2019  DOB: 01-Mar-1951       Prostate Seed Implant  XF:GHWEXH, Mortimer Fries, MD  No ref. provider found  DIAGNOSIS: 68 y.o. gentleman with Stage T1c adenocarcinoma of the prostate with Gleason score of 3+4, and PSA of 6.3.    ICD-10-CM   1. Prostate cancer ([place])  C61 DG Chest 2 View    DG Chest 2 View    PROCEDURE: Insertion of radioactive I-125 seeds into the prostate gland.  RADIATION DOSE: 145 Gy, definitive therapy.  TECHNIQUE: Logan Orr was brought to the operating room with the urologist. He was placed in the dorsolithotomy position. He was catheterized and a rectal tube was inserted. The perineum was shaved, prepped and draped. The ultrasound probe was then introduced into the rectum to see the prostate gland.  TREATMENT DEVICE: A needle grid was attached to the ultrasound probe stand and anchor needles were placed.  3D PLANNING: The prostate was imaged in 3D using a sagittal sweep of the prostate probe. These images were transferred to the planning computer. There, the prostate, urethra and rectum were defined on each axial reconstructed image. Then, the software created an optimized 3D plan and a few seed positions were adjusted. The quality of the plan was reviewed using [facility] information for the target and the following two organs at risk:  Urethra and Rectum.  Then the accepted plan was printed and handed off to the radiation therapist.  Under my supervision, the custom loading of the seeds and spacers was carried out and loaded into sealed vicryl sleeves.  These pre-loaded needles were then placed into the needle holder.Marland Kitchen  PROSTATE VOLUME STUDY:  Using transrectal ultrasound the volume of the prostate was verified to be 61.7 cc.  SPECIAL TREATMENT PROCEDURE/SUPERVISION AND HANDLING: The pre-loaded needles were then delivered under sagittal  guidance. A total of 30 needles were used to deposit 90 seeds in the prostate gland. The individual seed activity was 0.467 mCi.  SpaceOAR:  Yes  COMPLEX SIMULATION: At the end of the procedure, an anterior radiograph of the pelvis was obtained to document seed positioning and count. Cystoscopy was performed to check the urethra and bladder.  MICRODOSIMETRY: At the end of the procedure, the patient was emitting 0.175 mR/hr at 1 meter. Accordingly, he was considered safe for hospital discharge.  PLAN: The patient will return to the radiation oncology clinic for post implant CT dosimetry in three weeks.   ________________________________  Sheral Apley Tammi Klippel, M.D.

## 2019-02-04 NOTE — Anesthesia Procedure Notes (Signed)
Procedure Name: LMA Insertion Performed by: Sharunda Salmon D, CRNA Pre-anesthesia Checklist: Patient identified, Emergency Drugs available, Suction available and Patient being monitored Patient Re-evaluated:Patient Re-evaluated prior to induction Oxygen Delivery Method: Circle system utilized Preoxygenation: Pre-oxygenation with 100% oxygen Induction Type: IV induction Ventilation: Mask ventilation without difficulty LMA Size: 4.0 Tube type: Oral Number of attempts: 1 Placement Confirmation: positive ETCO2 and breath sounds checked- equal and bilateral Tube secured with: Tape Dental Injury: Teeth and Oropharynx as per pre-operative assessment

## 2019-02-04 NOTE — Anesthesia Preprocedure Evaluation (Signed)
Anesthesia Evaluation  Patient identified by MRN, date of birth, ID band Patient awake    Reviewed: Allergy & Precautions, H&P , NPO status , Patient's Chart, lab work & pertinent test results  Airway Mallampati: II   Neck ROM: full    Dental   Pulmonary neg pulmonary ROS,    breath sounds clear to auscultation       Cardiovascular hypertension,  Rhythm:regular Rate:Normal     Neuro/Psych    GI/Hepatic   Endo/Other    Renal/GU    Prostate CA    Musculoskeletal   Abdominal   Peds  Hematology   Anesthesia Other Findings   Reproductive/Obstetrics                             Anesthesia Physical Anesthesia Plan  ASA: II  Anesthesia Plan: General   Post-op Pain Management:    Induction: Intravenous  PONV Risk Score and Plan: 2 and Ondansetron, Dexamethasone and Treatment may vary due to age or medical condition  Airway Management Planned: LMA  Additional Equipment:   Intra-op Plan:   Post-operative Plan:   Informed Consent: I have reviewed the patients History and Physical, chart, labs and discussed the procedure including the risks, benefits and alternatives for the proposed anesthesia with the patient or authorized representative who has indicated his/her understanding and acceptance.       Plan Discussed with: CRNA, Anesthesiologist and Surgeon  Anesthesia Plan Comments:         Anesthesia Quick Evaluation

## 2019-02-04 NOTE — Transfer of Care (Signed)
Immediate Anesthesia Transfer of Care Note  Patient: Logan Orr  Procedure(s) Performed: RADIOACTIVE SEED IMPLANT/BRACHYTHERAPY IMPLANT (N/A Prostate) SPACE OAR INSTILLATION (N/A Rectum) CYSTOSCOPY (N/A Bladder)  Patient Location: PACU  Anesthesia Type:General  Level of Consciousness: awake, alert  and oriented  Airway & Oxygen Therapy: Patient Spontanous Breathing and Patient connected to nasal cannula oxygen  Post-op Assessment: Report given to RN and Post -op Vital signs reviewed and stable  Post vital signs: Reviewed and stable  Last Vitals:  Vitals Value Taken Time  BP    Temp    Pulse 65 02/04/19 1232  Resp 7 02/04/19 1232  SpO2 100 % 02/04/19 1232  Vitals shown include unvalidated device data.  Last Pain:  Vitals:   02/04/19 0856  TempSrc: Oral  PainSc: 0-No pain      Patients Stated Pain Goal: 7 (54/83/23 [id number])  Complications: No apparent anesthesia complications

## 2019-02-04 NOTE — Op Note (Signed)
PATIENT:  Logan Orr  PRE-OPERATIVE DIAGNOSIS:  Adenocarcinoma of the prostate  POST-OPERATIVE DIAGNOSIS:  Same  PROCEDURE:  1. I-125 radioactive seed implantation 2. Cystoscopy  3. Placement of SpaceOAR  SURGEON:  Surgeon(s): Wendie Simmer, MD  Radiation oncologist: Dr. Tyler Pita  ANESTHESIA:  General  EBL:  Minimal  DRAINS: 44 French Foley catheter  INDICATION: [name]  Description of procedure: After informed consent the patient was brought to the major OR, placed on the table and administered general anesthesia. He was then moved to the modified lithotomy position with his perineum perpendicular to the floor. His perineum and genitalia were then sterilely prepped. An official timeout was then performed. A 16 French Foley catheter was then placed in the bladder and filled with dilute contrast, a rectal tube was placed in the rectum and the transrectal ultrasound probe was placed in the rectum and affixed to the stand. He was then sterilely draped.  Real time ultrasonography was used along with the seed planning software [employer] Prostate. This was used to develop the seed plan including the number of needles as well as number of seeds required for complete and adequate coverage. Real-time ultrasonography was then used along with the previously developed plan and the Nucletron device to implant a total of 90 seeds using 30 needles. This proceeded without difficulty or complication.   I then proceeded with placement of SpaceOARby introducing a needle with the bevel angled inferiorly approximately 2 cm superior to the anus. This was angled downward and under direct ultrasound was placed within the space between the prostatic capsule and rectum. This was confirmed with a small amount of sterile saline injected and this was performed under direct ultrasound. I then attached the SpaceOARto the needle and injected this in the space between the prostate and rectum with  good placement noted.  A Foley catheter was then removed as well as the transrectal ultrasound probe and rectal probe. Flexible cystoscopy was then performed using the 17 French flexible scope which revealed a normal urethra throughout its length down to the sphincter which appeared intact. The prostatic urethra revealed bilobar hypertrophy but no evidence of obstruction, seeds, spacers or lesions. The bladder was then entered and fully and systematically inspected. The ureteral orifices were noted to be of normal configuration and position. The mucosa revealed no evidence of tumors. There were also no stones identified within the bladder. I noted no seeds or spacers on the floor of the bladder and retroflexion of the scope revealed no seeds protruding from the base of the prostate.  The cystoscope was then removed and the patient was awakened and taken to recovery room in stable and satisfactory condition. He tolerated procedure well and there were no intraoperative complications.

## 2019-02-05 ENCOUNTER — Encounter (HOSPITAL_BASED_OUTPATIENT_CLINIC_OR_DEPARTMENT_OTHER): Payer: Self-pay | Admitting: Urology

## 2019-02-05 NOTE — Anesthesia Postprocedure Evaluation (Signed)
Anesthesia Post Note  Patient: Taijuan Terpening  Procedure(s) Performed: RADIOACTIVE SEED IMPLANT/BRACHYTHERAPY IMPLANT (N/A Prostate) SPACE OAR INSTILLATION (N/A Rectum) CYSTOSCOPY (N/A Bladder)     Patient location during evaluation: PACU Anesthesia Type: General Level of consciousness: awake and alert Pain management: pain level controlled Vital Signs Assessment: post-procedure vital signs reviewed and stable Respiratory status: spontaneous breathing, nonlabored ventilation, respiratory function stable and patient connected to nasal cannula oxygen Cardiovascular status: blood pressure returned to baseline and stable Postop Assessment: no apparent nausea or vomiting Anesthetic complications: no    Last Vitals:  Vitals:   02/04/19 1317 02/04/19 1410  BP: (!) 161/104 (!) 152/98  Pulse:  67  Resp:  16  Temp:  (!) 36.3 C  SpO2:  100%    Last Pain:  Vitals:   02/04/19 1410  TempSrc: Axillary  PainSc: [place]

## 2019-02-06 ENCOUNTER — Telehealth: Payer: Self-pay | Admitting: Radiation Oncology

## 2019-02-06 NOTE — Telephone Encounter (Signed)
Received call from patient stating, "I can't seem to pass my urine." Reports urinary urgency with little to no output. Instructed patient to immediately phone Dr. Purvis Sheffield (urologist) office for an appointment today. Explained he could be experiencing urinary retention and that Dr. Gloriann Loan has the equipment to evaluate and determine the cause. Patient expressed his intent to call Dr. Purvis Sheffield office right away. Will contact patient later today to follow up.

## 2019-02-20 ENCOUNTER — Telehealth: Payer: Self-pay | Admitting: *Deleted

## 2019-02-20 NOTE — Telephone Encounter (Signed)
CALLED PATIENT TO REMIND OF POST SEED APPTS. AND MRI FOR 02-21-19 PATIENT STATED THAT HE DIDN'T THINK HE COULD MAKE THESE APPTS., WAITING FOR CALL BACK FROM PATIENT TO LET ME KNOW WHEN HE COULD MAKE THESE APPTS.

## 2019-02-20 NOTE — Telephone Encounter (Signed)
CALLED PATIENT TO INFORM THAT POST SEED SCANS AND MRI HAS BEEN RESCHEDULED FOR 03-05-19, MAILED APPT. CARDS TO PATIENT

## 2019-02-21 ENCOUNTER — Ambulatory Visit (HOSPITAL_COMMUNITY): Payer: Medicare HMO

## 2019-02-21 ENCOUNTER — Ambulatory Visit: Payer: Medicare HMO | Admitting: Radiation Oncology

## 2019-02-21 ENCOUNTER — Ambulatory Visit: Payer: Medicare HMO | Admitting: Urology

## 2019-03-01 ENCOUNTER — Telehealth: Payer: Self-pay

## 2019-03-01 NOTE — Telephone Encounter (Signed)

## 2019-03-04 ENCOUNTER — Telehealth: Payer: Self-pay | Admitting: *Deleted

## 2019-03-04 ENCOUNTER — Ambulatory Visit: Payer: Medicare HMO | Admitting: Family Medicine

## 2019-03-04 NOTE — Telephone Encounter (Signed)
CALLED PATIENT TO REMIND OF POST SEED APPTS. AND MRI FOR 03-05-19, SPOKE WITH PATIENT AND HE IS AWARE OF THESE APPTS.

## 2019-03-05 ENCOUNTER — Other Ambulatory Visit: Payer: Self-pay

## 2019-03-05 ENCOUNTER — Ambulatory Visit
Admission: RE | Admit: 2019-03-05 | Discharge: 2019-03-05 | Disposition: A | Discharge status: Home / Self Care | Payer: Medicare HMO | Source: Ambulatory Visit | Attending: Urology | Admitting: Urology

## 2019-03-05 ENCOUNTER — Ambulatory Visit (HOSPITAL_COMMUNITY)
Admission: RE | Admit: 2019-03-05 | Discharge: 2019-03-05 | Disposition: A | Discharge status: Home / Self Care | Payer: Medicare HMO | Source: Ambulatory Visit | Attending: Urology | Admitting: Urology

## 2019-03-05 ENCOUNTER — Ambulatory Visit: Payer: Medicare HMO | Admitting: Family Medicine

## 2019-03-05 ENCOUNTER — Encounter: Payer: Self-pay | Admitting: Urology

## 2019-03-05 ENCOUNTER — Encounter: Payer: Self-pay | Admitting: Emergency Medicine

## 2019-03-05 VITALS — BP 141/95 | HR 72 | Temp 99.1°F | Resp 20 | Wt 204.1 lb

## 2019-03-05 DIAGNOSIS — R3911 Hesitancy of micturition: Secondary | ICD-10-CM | POA: Insufficient documentation

## 2019-03-05 DIAGNOSIS — C61 Malignant neoplasm of prostate: Secondary | ICD-10-CM | POA: Insufficient documentation

## 2019-03-05 DIAGNOSIS — Z510 Encounter for antineoplastic radiation therapy: Secondary | ICD-10-CM | POA: Diagnosis present

## 2019-03-05 DIAGNOSIS — Z923 Personal history of irradiation: Secondary | ICD-10-CM | POA: Insufficient documentation

## 2019-03-05 DIAGNOSIS — Z79899 Other long term (current) drug therapy: Secondary | ICD-10-CM | POA: Insufficient documentation

## 2019-03-05 DIAGNOSIS — R350 Frequency of micturition: Secondary | ICD-10-CM | POA: Insufficient documentation

## 2019-03-05 NOTE — Progress Notes (Addendum)
Logan Orr is here for a follow-up appointment today. Patient has a MRI scheduled for today at 1pm. Patient states that he has an appointment with his urologist next week. Patient reports some dysuria. Patient denies any hematuria with urination.States that he has some bleeding from his rectal. Patient reports nocturia x3-4. Patient states that he empties bladder with urination.Patient  states that his stream is moderate. Patient states that he has urgency and leakage. Patient states that he is having constipation.States that he is taking a stool softener for relief.  Vitals:   03/05/19 0813  BP: (!) 141/95  Pulse: 72  Resp: 20  Temp: 99.1 F (37.3 C)  TempSrc: Oral  SpO2: 100%  Weight: 204 lb 2 oz (92.6 kg)

## 2019-03-05 NOTE — Progress Notes (Signed)
Radiation Oncology         [phone] ________________________________  Name: Logan Orr MRN: [id number]  Date: 03/05/2019  DOB: 08-30-1950  Post-Seed Follow-Up Visit Note  CC: Libby Maw, MD  Libby Maw,*  Diagnosis:   68 y.o. male with Stage T1c adenocarcinoma of the prostate with Gleason score of 3+4, and PSA of 6.3     ICD-10-CM   1. Malignant neoplasm of prostate ([place])  C61     Interval Since Last Radiation:  1 month 02/04/2019:  Insertion of radioactive I-125 seeds into the prostate gland; 145 Gy, definitive therapy with placement of SpaceOAR gel.  Narrative:  The patient returns today for routine follow-up.  He is complaining of increased urinary frequency and urinary hesitation symptoms. He filled out a questionnaire regarding urinary function today providing and overall IPSS score of 18 characterizing his symptoms as moderate.  He reports nocturia 4+/night, weak flow of stream, intermittency and hesitancy, especially in the mornings, despite taking Flomax daily.  He had a follow up visit with Dr. Gloriann Loan a week ago and PVR showed that he is emptying his bladder adequately.  He was advised that he could increase Flomax to twice daily at that time but has not done so to date.  He denies gross hematuria, flank pain, fever or chills.  His pre-implant score was 12. He denies any abdominal pain or bowel symptoms.  He feels that his LUTS are gradually improving and overall is pleased with his progress to date.  ALLERGIES:  has No Known Allergies.  Meds: Current Outpatient Medications  Medication Sig Dispense Refill  . amLODipine (NORVASC) 10 MG tablet Take 1 tablet (10 mg total) by mouth daily. (Patient taking differently: Take 10 mg by mouth daily. ) 90 tablet 2  . atorvastatin (LIPITOR) 20 MG tablet Take 1 tablet (20 mg total) by mouth daily. (Patient taking differently: Take 20 mg by mouth daily. ) 90 tablet 3  . ketorolac (ACULAR) 0.5 % ophthalmic solution  INSTILL 1 DROP INTO LEFT EYE 4 TIMES DAILY    . tamsulosin (FLOMAX) 0.4 MG CAPS capsule Take 1 capsule (0.4 mg total) by mouth daily. (Patient taking differently: Take 0.4 mg by mouth daily. ) 30 capsule 3  . HYDROcodone-acetaminophen (NORCO) 5-325 MG tablet Take 1 tablet by mouth every 4 (four) hours as needed for moderate pain. (Patient not taking: Reported on 03/05/2019) 8 tablet 0   No current facility-administered medications for this encounter.     Physical Findings: In general this is a well appearing African American male in no acute distress. He's alert and oriented x4 and appropriate throughout the examination. Cardiopulmonary assessment is negative for acute distress and he exhibits normal effort.   Lab Findings: Lab Results  Component Value Date   WBC 4.0 01/31/2019   HGB 13.3 01/31/2019   HCT 40.6 01/31/2019   MCV 92.3 01/31/2019   PLT 286 01/31/2019    Radiographic Findings:  Patient underwent CT imaging in our clinic for post implant dosimetry. The CT will be reviewed by Dr. Tammi Klippel to confirm there is an adequate distribution of radioactive seeds throughout the prostate gland and ensure that there are no seeds in or near the rectum. His scheduled for prostate MRI later today at 1pm and those images will be fused with his CT images for further evaluation. We suspect the final radiation plan and dosimetry will show appropriate coverage of the prostate gland. He understands that we will call and inform him of any  unexpected findings on further review of his imaging and dosimetry.  Impression/Plan: The patient is recovering from the effects of radiation. His urinary symptoms should gradually improve over the next 4-6 months. We talked about this today. He is encouraged by his improvement already and is otherwise pleased with his outcome. He plans to increase his Flomax dosage to BID starting today to see if this further improves his LUTS.  We also talked about long-term follow-up  for prostate cancer following seed implant. He understands that ongoing PSA determinations and digital rectal exams will help perform surveillance to rule out disease recurrence. He had a follow up appointment with Dr. Gloriann Loan 1 week ago and plans to return in  2 months for posttreatment PSA evaluation. He understands what to expect with his PSA measures. Patient was also educated today about some of the long-term effects from radiation including a small risk for rectal bleeding and possibly erectile dysfunction. We talked about some of the general management approaches to these potential complications. However, I did encourage the patient to contact our office or return at any point if he has questions or concerns related to his previous radiation and prostate cancer.    Nicholos Johns, PA-C  This document serves as a record of services personally performed by [employer], PA-C. It was created on her behalf by Rae Lips, a trained medical scribe. The creation of this record is based on the scribe's personal observations and the provider's statements to them. This document has been checked and approved by the attending provider.

## 2019-03-07 NOTE — Progress Notes (Signed)
  Radiation Oncology         (336) [phone] ________________________________  Name: Logan Orr MRN: [id number]  Date: 03/05/2019  DOB: 09/19/1950  COMPLEX SIMULATION NOTE  NARRATIVE:  The patient was brought to the [facility] today following prostate seed implantation approximately one month ago.  Identity was confirmed.  All relevant records and images related to the planned course of therapy were reviewed.  Then, the patient was set-up supine.  CT images were obtained.  The CT images were loaded into the planning software.  Then the prostate and rectum were contoured.  Treatment planning then occurred.  The implanted iodine 125 seeds were identified by the physics staff for projection of radiation distribution  I have requested : 3D Simulation  I have requested a DVH of the following structures: Prostate and rectum.    ________________________________  Sheral Apley Tammi Klippel, M.D.

## 2019-03-08 ENCOUNTER — Encounter: Payer: Self-pay | Admitting: Radiation Oncology

## 2019-03-08 DIAGNOSIS — Z510 Encounter for antineoplastic radiation therapy: Secondary | ICD-10-CM | POA: Diagnosis not present

## 2019-03-10 NOTE — Progress Notes (Signed)
  Radiation Oncology         (336) [phone] ________________________________  Name: Logan Orr MRN: [id number]  Date: 03/08/2019  DOB: 15-Aug-1951  3D Planning Note   Prostate Brachytherapy Post-Implant Dosimetry  Diagnosis: 68 y.o. gentleman with Stage T1c adenocarcinoma of the prostate with Gleason score of 3+4, and PSA of 6.3   Narrative: On a previous date, Logan Orr returned following prostate seed implantation for post implant planning. He underwent CT scan complex simulation to delineate the three-dimensional structures of the pelvis and demonstrate the radiation distribution.  Since that time, the seed localization, and complex isodose planning with dose volume histograms have now been completed.  Results:   Prostate Coverage - The dose of radiation delivered to the 90% or more of the prostate gland (D90) was 107.29% of the prescription dose. This exceeds our goal of greater than 90%. Rectal Sparing - The volume of rectal tissue receiving the prescription dose or higher was 0.0 cc. This falls under our thresholds tolerance of 1.0 cc.  Impression: The prostate seed implant appears to show adequate target coverage and appropriate rectal sparing.  Plan:  The patient will continue to follow with urology for ongoing PSA determinations. I would anticipate a high likelihood for local tumor control with minimal risk for rectal morbidity.  ________________________________  Sheral Apley Tammi Klippel, M.D.

## 2019-03-14 ENCOUNTER — Encounter: Payer: Self-pay | Admitting: Family Medicine

## 2019-03-14 ENCOUNTER — Ambulatory Visit (INDEPENDENT_AMBULATORY_CARE_PROVIDER_SITE_OTHER): Payer: Medicare HMO | Admitting: Family Medicine

## 2019-03-14 VITALS — BP 124/80 | HR 98 | Ht 69.0 in | Wt 206.1 lb

## 2019-03-14 DIAGNOSIS — E079 Disorder of thyroid, unspecified: Secondary | ICD-10-CM | POA: Diagnosis not present

## 2019-03-14 DIAGNOSIS — E7800 Pure hypercholesterolemia, unspecified: Secondary | ICD-10-CM | POA: Diagnosis not present

## 2019-03-14 DIAGNOSIS — I10 Essential (primary) hypertension: Secondary | ICD-10-CM

## 2019-03-14 DIAGNOSIS — K635 Polyp of colon: Secondary | ICD-10-CM | POA: Diagnosis not present

## 2019-03-14 DIAGNOSIS — M545 Low back pain, unspecified: Secondary | ICD-10-CM

## 2019-03-14 DIAGNOSIS — Z9119 Patient's noncompliance with other medical treatment and regimen: Secondary | ICD-10-CM

## 2019-03-14 DIAGNOSIS — F1090 Alcohol use, unspecified, uncomplicated: Secondary | ICD-10-CM | POA: Insufficient documentation

## 2019-03-14 DIAGNOSIS — Z789 Other specified health status: Secondary | ICD-10-CM

## 2019-03-14 DIAGNOSIS — Z91199 Patient's noncompliance with other medical treatment and regimen due to unspecified reason: Secondary | ICD-10-CM

## 2019-03-14 DIAGNOSIS — Z7289 Other problems related to lifestyle: Secondary | ICD-10-CM

## 2019-03-14 LAB — CBC
HCT: 37 % — ABNORMAL LOW (ref 39.0–52.0)
Hemoglobin: 12.4 g/dL — ABNORMAL LOW (ref 13.0–17.0)
MCHC: 33.6 g/dL (ref 30.0–36.0)
MCV: 89.1 fl (ref 78.0–100.0)
Platelets: 304 10*3/uL (ref 150.0–400.0)
RBC: 4.16 Mil/uL — ABNORMAL LOW (ref 4.22–5.81)
RDW: 13.3 % (ref 11.5–15.5)
WBC: 4.2 10*3/uL (ref 4.0–10.5)

## 2019-03-14 LAB — COMPREHENSIVE METABOLIC PANEL
ALT: 24 U/L (ref 0–53)
AST: 28 U/L (ref 0–37)
Albumin: 4.2 g/dL (ref 3.5–5.2)
Alkaline Phosphatase: 85 U/L (ref 39–117)
BUN: 18 mg/dL (ref 6–23)
CO2: 27 mEq/L (ref 19–32)
Calcium: 9.8 mg/dL (ref 8.4–10.5)
Chloride: 104 mEq/L (ref 96–112)
Creatinine, Ser: 1.31 mg/dL (ref 0.40–1.50)
GFR: 65.98 mL/min (ref >60.00)
Glucose, Bld: 84 mg/dL (ref 70–99)
Potassium: 4 mEq/L (ref 3.5–5.1)
Sodium: 139 mEq/L (ref 135–145)
Total Bilirubin: 0.5 mg/dL (ref 0.2–1.2)
Total Protein: 6.7 g/dL (ref 6.0–8.3)

## 2019-03-14 LAB — LDL CHOLESTEROL, DIRECT: Direct LDL: 80 mg/dL

## 2019-03-14 LAB — TSH: TSH: 0.39 u[IU]/mL (ref 0.35–4.50)

## 2019-03-14 NOTE — Patient Instructions (Signed)
Chronic Back Pain When back pain lasts longer than 3 months, it is called chronic back pain.The cause of your back pain may not be known. Some common causes include:  Wear and tear (degenerative disease) of the bones, ligaments, or disks in your back.  Inflammation and stiffness in your back (arthritis). People who have chronic back pain often go through certain periods in which the pain is more intense (flare-ups). Many people can learn to manage the pain with home care. Follow these instructions at home: Pay attention to any changes in your symptoms. Take these actions to help with your pain: Activity   Avoid bending and other activities that make the problem worse.  Maintain a proper position when standing or sitting: ? When standing, keep your upper back and neck straight, with your shoulders pulled back. Avoid slouching. ? When sitting, keep your back straight and relax your shoulders. Do not round your shoulders or pull them backward.  Do not sit or stand in one place for long periods of time.  Take brief periods of rest throughout the day. This will reduce your pain. Resting in a lying or standing position is usually better than sitting to rest.  When you are resting for longer periods, mix in some mild activity or stretching between periods of rest. This will help to prevent stiffness and pain.  Get regular exercise. Ask your health care provider what activities are safe for you.  Do not lift anything that is heavier than 10 lb (4.5 kg). Always use proper lifting technique, which includes: ? Bending your knees. ? Keeping the load close to your body. ? Avoiding twisting.  Sleep on a firm mattress in a comfortable position. Try lying on your side with your knees slightly bent. If you lie on your back, put a pillow under your knees. Managing pain  If directed, apply ice to the painful area. Your health care provider may recommend applying ice during the first 24-48 hours after  a flare-up begins. ? Put ice in a plastic bag. ? Place a towel between your skin and the bag. ? Leave the ice on for 20 minutes, 2-3 times per day.  If directed, apply heat to the affected area as often as told by your health care provider. Use the heat source that your health care provider recommends, such as a moist heat pack or a heating pad. ? Place a towel between your skin and the heat source. ? Leave the heat on for 20-30 minutes. ? Remove the heat if your skin turns bright red. This is especially important if you are unable to feel pain, heat, or cold. You may have a greater risk of getting burned.  Try soaking in a warm tub.  Take over-the-counter and prescription medicines only as told by your health care provider.  Keep all follow-up visits as told by your health care provider. This is important. Contact a health care provider if:  You have pain that is not relieved with rest or medicine. Get help right away if:  You have weakness or numbness in one or both of your legs or feet.  You have trouble controlling your bladder or your bowels.  You have nausea or vomiting.  You have pain in your abdomen.  You have shortness of breath or you faint. This information is not intended to replace advice given to you by your health care provider. Make sure you discuss any questions you have with your health care provider. Document Released: 09/15/2004   Document Revised: 11/29/2018 Document Reviewed: 02/15/2017 Elsevier Patient Education  [place] Maintenance After Age 67 After age 62, you are at a higher risk for certain long-term diseases and infections as well as injuries from falls. Falls are a major cause of broken bones and head injuries in people who are older than age 67. Getting regular preventive care can help to keep you healthy and well. Preventive care includes getting regular testing and making lifestyle changes as recommended by your health care  provider. Talk with your health care provider about:  Which screenings and tests you should have. A screening is a test that checks for a disease when you have no symptoms.  A diet and exercise plan that is right for you. What should I know about screenings and tests to prevent falls? Screening and testing are the best ways to find a health problem early. Early diagnosis and treatment give you the best chance of managing medical conditions that are common after age 67. Certain conditions and lifestyle choices may make you more likely to have a fall. Your health care provider may recommend:  Regular vision checks. Poor vision and conditions such as cataracts can make you more likely to have a fall. If you wear glasses, make sure to get your prescription updated if your vision changes.  Medicine review. Work with your health care provider to regularly review all of the medicines you are taking, including over-the-counter medicines. Ask your health care provider about any side effects that may make you more likely to have a fall. Tell your health care provider if any medicines that you take make you feel dizzy or sleepy.  Osteoporosis screening. Osteoporosis is a condition that causes the bones to get weaker. This can make the bones weak and cause them to break more easily.  Blood pressure screening. Blood pressure changes and medicines to control blood pressure can make you feel dizzy.  Strength and balance checks. Your health care provider may recommend certain tests to check your strength and balance while standing, walking, or changing positions.  Foot health exam. Foot pain and numbness, as well as not wearing proper footwear, can make you more likely to have a fall.  Depression screening. You may be more likely to have a fall if you have a fear of falling, feel emotionally low, or feel unable to do activities that you used to do.  Alcohol use screening. Using too much alcohol can affect your  balance and may make you more likely to have a fall. What actions can I take to lower my risk of falls? General instructions  Talk with your health care provider about your risks for falling. Tell your health care provider if: ? You fall. Be sure to tell your health care provider about all falls, even ones that seem minor. ? You feel dizzy, sleepy, or off-balance.  Take over-the-counter and prescription medicines only as told by your health care provider. These include any supplements.  Eat a healthy diet and maintain a healthy weight. A healthy diet includes low-fat dairy products, low-fat (lean) meats, and fiber from whole grains, beans, and lots of fruits and vegetables. Home safety  Remove any tripping hazards, such as rugs, cords, and clutter.  Install safety equipment such as grab bars in bathrooms and safety rails on stairs.  Keep rooms and walkways well-lit. Activity   Follow a regular exercise program to stay fit. This will help you maintain your balance. Ask your health care provider  what types of exercise are appropriate for you.  If you need a cane or walker, use it as recommended by your health care provider.  Wear supportive shoes that have nonskid soles. Lifestyle  Do not drink alcohol if your health care provider tells you not to drink.  If you drink alcohol, limit how much you have: ? 0-1 drink a day for women. ? 0-2 drinks a day for men.  Be aware of how much alcohol is in your drink. In the [place] one drink equals one typical bottle of beer (12 oz), one-half glass of wine (5 oz), or one shot of hard liquor (1 oz).  Do not use any products that contain nicotine or tobacco, such as cigarettes and e-cigarettes. If you need help quitting, ask your health care provider. Summary  Having a healthy lifestyle and getting preventive care can help to protect your health and wellness after age 80.  Screening and testing are the best way to find a health problem early  and help you avoid having a fall. Early diagnosis and treatment give you the best chance for managing medical conditions that are more common for people who are older than age 41.  Falls are a major cause of broken bones and head injuries in people who are older than age 4. Take precautions to prevent a fall at home.  Work with your health care provider to learn what changes you can make to improve your health and wellness and to prevent falls. This information is not intended to replace advice given to you by your health care provider. Make sure you discuss any questions you have with your health care provider. Document Released: 06/21/2017 Document Revised: 11/29/2018 Document Reviewed: 06/21/2017 Elsevier Patient Education  2020 [employer].

## 2019-03-14 NOTE — Progress Notes (Signed)
Established Patient Office Visit  Subjective:  Patient ID: Logan Orr, male    DOB: 1950/09/07  Age: 68 y.o. MRN: [id number]  CC:  Chief Complaint  Patient presents with  . Follow-up    HPI [facility] presents for follow-up of his hypertension and elevated LDL cholesterol.  Patient reports compliance with his Norvasc and Lipitor.  I have asked him to return this past April but he was unable to make the appointment.  Has not been able to make the appointment for the thyroid ultrasound.  Status post cataract extraction about last 2 weeks ago and has been having problems with his early morning vision.  Scheduled to see ophthalmology this afternoon.  Status post seeding of his prostate 2 months ago.  Scheduled for follow-up with urology next week.  He has been having some difficulty urinating.  He has decreased his alcohol usage down to 2 beers after the seeding procedure.  Chart review shows that he has a history of adenomatous polyps in his colon.  Last colonoscopy was in 2018 and he is due for repeat in 2023.  He lives alone.  Daughter lives in [place].  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  Was able to see the dentist this past March he tells me.  He has been having morning stiffness in his lower back.  Nonfasting today. Home sleep study was negative for apnea.    Lab  Okay  Past Medical History:  Diagnosis Date  . Hemorrhoids   . History of colon polyps   . HLD (hyperlipidemia)   . Hyperplasia of prostate with lower urinary tract symptoms (LUTS)   . Hypertension   . Prostate cancer [facility]) urologist-- dr bell/  oncologist-- dr Tammi Klippel   dx 11-23-2018  Stage T1c,  3+4  . Thyroid nodule    08-03-2018  pt pcp note in epic, left side, ultasound ordered   . Wears glasses     Past Surgical History:  Procedure Laterality Date  . CYSTOSCOPY N/A 02/04/2019   Procedure: CYSTOSCOPY;  Surgeon: Lucas Mallow, MD;  Location: [facility];  Service:  Urology;  Laterality: N/A;  no seeds seen per Dr Gloriann Loan  . LACERATION REPAIR  1990s   repair left lower arm complex laceration   . PROSTATE BIOPSY  11-23-2018   at dr Gloriann Loan office  . RADIOACTIVE SEED IMPLANT N/A 02/04/2019   Procedure: RADIOACTIVE SEED IMPLANT/BRACHYTHERAPY IMPLANT;  Surgeon: Lucas Mallow, MD;  Location: [facility];  Service: Urology;  Laterality: N/A;  90 seeds  . SPACE OAR INSTILLATION N/A 02/04/2019   Procedure: SPACE OAR INSTILLATION;  Surgeon: Lucas Mallow, MD;  Location: [facility];  Service: Urology;  Laterality: N/A;    Family History  Adopted: Yes  Problem Relation Age of Onset  . Cancer Neg Hx     Social History   Socioeconomic History  . Marital status: Single    Spouse name: Not on file  . Number of children: 1  . Years of education: Not on file  . Highest education level: Not on file  Occupational History    Comment: unemployed   Social Needs  . Financial resource strain: Not on file  . Food insecurity    Worry: Not on file    Inability: Not on file  . Transportation needs    Medical: Not on file    Non-medical: Not on file  Tobacco Use  .  Smoking status: Never Smoker  . Smokeless tobacco: Never Used  Substance and Sexual Activity  . Alcohol use: Yes    Alcohol/week: 35.0 standard drinks    Types: 35 Cans of beer per week    Comment: 4 to 5 beers daily  . Drug use: Never  . Sexual activity: Yes  Lifestyle  . Physical activity    Days per week: Not on file    Minutes per session: Not on file  . Stress: Not on file  Relationships  . Social Herbalist on phone: Not on file    Gets together: Not on file    Attends religious service: Not on file    Active member of club or organization: Not on file    Attends meetings of clubs or organizations: Not on file    Relationship status: Not on file  . Intimate partner violence    Fear of current or ex partner: Not on file    Emotionally  abused: Not on file    Physically abused: Not on file    Forced sexual activity: Not on file  Other Topics Concern  . Not on file  Social History Narrative  . Not on file    Outpatient Medications Prior to Visit  Medication Sig Dispense Refill  . amLODipine (NORVASC) 10 MG tablet Take 1 tablet (10 mg total) by mouth daily. (Patient taking differently: Take 10 mg by mouth daily. ) 90 tablet 2  . atorvastatin (LIPITOR) 20 MG tablet Take 1 tablet (20 mg total) by mouth daily. (Patient taking differently: Take 20 mg by mouth daily. ) 90 tablet 3  . ketorolac (ACULAR) 0.5 % ophthalmic solution INSTILL 1 DROP INTO LEFT EYE 4 TIMES DAILY    . tamsulosin (FLOMAX) 0.4 MG CAPS capsule Take 1 capsule (0.4 mg total) by mouth daily. (Patient taking differently: Take 0.4 mg by mouth daily. ) 30 capsule 3  . HYDROcodone-acetaminophen (NORCO) 5-325 MG tablet Take 1 tablet by mouth every 4 (four) hours as needed for moderate pain. (Patient not taking: Reported on 03/05/2019) 8 tablet 0   No facility-administered medications prior to visit.     No Known Allergies  ROS Review of Systems  Constitutional: Negative.   HENT: Negative.   Eyes: Positive for visual disturbance. Negative for pain and discharge.  Respiratory: Negative for chest tightness, shortness of breath and wheezing.   Cardiovascular: Negative for chest pain and palpitations.  Gastrointestinal: Negative.   Endocrine: Negative for polyphagia and polyuria.  Genitourinary: Positive for difficulty urinating.  Musculoskeletal: Positive for back pain.  Skin: Negative for pallor and rash.  Allergic/Immunologic: Negative for immunocompromised state.  Neurological: Negative for speech difficulty, light-headedness and numbness.  Hematological: Does not bruise/bleed easily.  Psychiatric/Behavioral: Negative.       Objective:    Physical Exam  Constitutional: He is oriented to person, place, and time. He appears well-developed and  well-nourished. No distress.  HENT:  Head: Normocephalic and atraumatic.  Right Ear: External ear normal.  Left Ear: External ear normal.  Mouth/Throat: Oropharynx is clear and moist. No oropharyngeal exudate.  Eyes: Pupils are equal, round, and reactive to light. Conjunctivae are normal. Right eye exhibits no discharge. Left eye exhibits no discharge. No scleral icterus.  Neck: No JVD present. No tracheal deviation present. Thyromegaly present.  Cardiovascular: Normal rate, regular rhythm and normal heart sounds.  Pulmonary/Chest: Effort normal and breath sounds normal. No stridor.  Abdominal: Soft. Bowel sounds are normal. He exhibits no  distension. There is no abdominal tenderness. There is no rebound.  Lymphadenopathy:    He has no cervical adenopathy.  Neurological: He is alert and oriented to person, place, and time.  Skin: Skin is warm and dry. He is not diaphoretic.  Psychiatric: He has a normal mood and affect. His behavior is normal.    BP 124/80   Pulse 98   Ht 5\' 9"  (1.753 m)   Wt 206 lb 2 oz (93.5 kg)   SpO2 98%   BMI 30.44 kg/m  Wt Readings from Last 3 Encounters:  03/14/19 206 lb 2 oz (93.5 kg)  03/05/19 204 lb 2 oz (92.6 kg)  02/04/19 201 lb 2 oz (91.2 kg)   BP Readings from Last 3 Encounters:  03/14/19 124/80  03/05/19 (!) 141/95  02/04/19 (!) 152/98   Guideline developer:  UpToDate (see UpToDate for funding source) Date Released: June 2014  Health Maintenance Due  Topic Date Due  . Hepatitis C Screening  03-26-1951  . TETANUS/TDAP  11/30/1970  . PNA vac Low Risk Adult (1 of 2 - PCV13) 11/29/2016    There are no preventive care reminders to display for this patient.  Lab Results  Component Value Date   TSH 0.43 08/03/2018   Lab Results  Component Value Date   WBC 4.0 01/31/2019   HGB 13.3 01/31/2019   HCT 40.6 01/31/2019   MCV 92.3 01/31/2019   PLT 286 01/31/2019   Lab Results  Component Value Date   NA 137 01/31/2019   K 3.9 01/31/2019    CO2 25 01/31/2019   GLUCOSE 100 (H) 01/31/2019   BUN 20 01/31/2019   CREATININE 1.40 (H) 01/31/2019   BILITOT 0.6 01/31/2019   ALKPHOS 78 01/31/2019   AST 32 01/31/2019   ALT 25 01/31/2019   PROT 7.5 01/31/2019   ALBUMIN 4.3 01/31/2019   CALCIUM 9.2 01/31/2019   ANIONGAP 10 01/31/2019   Lab Results  Component Value Date   CHOL 281 (H) 08/03/2018   Lab Results  Component Value Date   HDL 55 08/03/2018   Lab Results  Component Value Date   LDLCALC 199 (H) 08/03/2018   Lab Results  Component Value Date   TRIG 122 08/03/2018   Lab Results  Component Value Date   CHOLHDL 5.1 (H) 08/03/2018   No results found for: HGBA1C    Assessment & Plan:   Problem List Items Addressed This Visit      Cardiovascular and Mediastinum   Essential hypertension - Primary   Relevant Orders   CBC   Comprehensive metabolic panel     Digestive   Polyp of colon     Other   Thyroid mass   Relevant Orders   TSH   US THYROID   Elevated LDL cholesterol level   Relevant Orders   LDL cholesterol, direct   Alcohol use   Midline low back pain without sciatica   Relevant Orders   Ambulatory referral to Sports Medicine   Non-compliant patient      No orders of the defined types were placed in this encounter.   Follow-up: Return in about 3 months (around 06/14/2019).   Patient will follow-up with neurology and ophthalmology.  Given information on health maintenance and disease prevention.  He agreed to go for the ultrasound of his thyroid.  Follow-up in 3 months.

## 2019-03-18 ENCOUNTER — Ambulatory Visit: Payer: Medicare HMO | Admitting: Family Medicine

## 2019-03-18 ENCOUNTER — Other Ambulatory Visit: Payer: Self-pay

## 2019-03-18 VITALS — BP 124/78 | Ht 69.0 in | Wt 204.0 lb

## 2019-03-18 DIAGNOSIS — G8929 Other chronic pain: Secondary | ICD-10-CM

## 2019-03-18 DIAGNOSIS — M545 Low back pain, unspecified: Secondary | ICD-10-CM

## 2019-03-18 NOTE — Progress Notes (Signed)
  [place] - 68 y.o. male MRN [id number]  Date of birth: 07/28/1951  SUBJECTIVE:   CC: low back pain Logan Orr is a 68 yo man presenting with chronic low back pain for the past 4-5 years. Reports that his lower back is stiff and painful every morning. It gets better as he moves throughout the day. No specific activities worsen the pain. No nighttime pain that wakes him out of sleep. Does not take medication for the pain. Has iced it in the past which has made it feel better. Denies numbness/tingling or radiating pain down legs.   H/o prostate cancer with seed implantation 6/15. Difficulty urinating. 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   ROS: No unexpected weight loss, fever, chills, swelling, instability, muscle pain, numbness/tingling, redness, otherwise see HPI   PMHx - Updated and reviewed.  Contributory factors include: prostate cancer, in treatment PSHx - Updated and reviewed.  Contributory factors include:  Negative FHx - Updated and reviewed.  Contributory factors include:  Negative Social Hx - Updated and reviewed. Contributory factors include: Negative Medications - reviewed   DATA REVIEWED: Prior records  PHYSICAL EXAM:  VS: BP:124/78  HR: bpm  TEMP: ( )  RESP:   HT:5\' 9"  (175.3 cm)   WT:204 lb (92.5 kg)  BMI:30.11 PHYSICAL EXAM: Gen: NAD, alert, cooperative with exam, well-appearing HEENT: clear conjunctiva,  CV:  no edema, capillary refill brisk, normal rate Resp: non-labored Skin: no rashes, normal turgor  Neuro: no gross deficits.  Psych: alert and oriented  Lumbar spine: - Inspection: no gross deformity or asymmetry, swelling or ecchymosis - Palpation: No TTP over the spinous processes, TTP over paraspinal muscles at L5/S1,  No TTP over SI joints b/l - ROM: full active ROM of the lumbar spine in flexion and extension without pain - Strength: 5/5 strength of lower  extremity in L4-S1 nerve root distributions b/l; normal gait - Neuro: sensation intact in the L4-S1 nerve root distribution b/l, 2+ L4 and S1 reflexes - Special testing: Negative straight leg raise, negative slump, negative Stork test, Negative FABER  Bilateral hips: No deformity. FROM with 5/5 strength. No tenderness to palpation. NVI distally. Negative logrolls.  ASSESSMENT & PLAN:   68 yo male with history of prostate cancer, currently undergoing treatment with prostate seeding, presenting with 5 year history of lower back pain. History consistent with arthritis, given stiffness in the morning and improvement throughout the day. No red flag symptoms of night sweats, nighttime pain, fevers, and no point tenderness over spinous processes to prompt need for x-rays. Although he is currently being treated for prostate cancer, back pain preceded the cancer diagnosis. Discussed conservative management with tylenol, home therapy exercises. Will return in 4-6 weeks if no improvement. Will consider x-ray imaging if pain does not improve.

## 2019-03-18 NOTE — Patient Instructions (Addendum)
You have arthritis of your low back with muscle spasms. Take tylenol as needed for pain.   Stay as active as possible. Do home exercises and stretches as directed - hold each for 20-30 seconds and do each one three times. Consider massage, chiropractor, physical therapy, and/or acupuncture. Physical therapy has been shown to be helpful while the others have mixed results. Strengthening of low back muscles, abdominal musculature are key for long term pain relief. If not improving, will consider further imaging (xrays). Follow up with me in 6 weeks.

## 2019-04-15 ENCOUNTER — Ambulatory Visit: Payer: Medicare HMO | Admitting: Family Medicine

## 2019-06-05 DIAGNOSIS — C61 Malignant neoplasm of prostate: Secondary | ICD-10-CM | POA: Diagnosis not present

## 2019-06-06 DIAGNOSIS — C61 Malignant neoplasm of prostate: Secondary | ICD-10-CM | POA: Diagnosis not present

## 2019-06-06 DIAGNOSIS — R350 Frequency of micturition: Secondary | ICD-10-CM | POA: Diagnosis not present

## 2019-06-06 DIAGNOSIS — R3911 Hesitancy of micturition: Secondary | ICD-10-CM | POA: Diagnosis not present

## 2019-06-06 DIAGNOSIS — R351 Nocturia: Secondary | ICD-10-CM | POA: Diagnosis not present

## 2019-06-06 DIAGNOSIS — R3915 Urgency of urination: Secondary | ICD-10-CM | POA: Diagnosis not present

## 2019-06-14 DIAGNOSIS — Z20828 Contact with and (suspected) exposure to other viral communicable diseases: Secondary | ICD-10-CM | POA: Diagnosis not present

## 2019-07-10 ENCOUNTER — Ambulatory Visit: Payer: Medicare HMO | Admitting: Family Medicine

## 2019-09-23 ENCOUNTER — Other Ambulatory Visit: Payer: Self-pay | Admitting: Family Medicine

## 2019-09-23 DIAGNOSIS — E7800 Pure hypercholesterolemia, unspecified: Secondary | ICD-10-CM

## 2019-09-23 NOTE — Telephone Encounter (Signed)
Patient is calling requesting a refill for his blood pressure and cholesterol medication sent to [facility]. Pt didn't know the names of medication also requested a 90 supply. CB is [phone]

## 2019-09-25 NOTE — Telephone Encounter (Signed)
Pt calling for refill on pending medications per pt he will come in for appointment scheduled for next week he is completley out of blood pressure medicine. I tried to fill both but message came up stating pt was taking different dose so I wanted to send to you. Please advise.

## 2019-09-25 NOTE — Telephone Encounter (Signed)
Error

## 2019-09-25 NOTE — Telephone Encounter (Signed)
Called left message for pt to call back checking to see if pt has enough meds to cover him until his appointment on Feb. 11th

## 2019-09-26 MED ORDER — ATORVASTATIN CALCIUM 20 MG PO TABS: 20.0000 mg | ORAL_TABLET | Freq: Every day | ORAL | 3 refills | Status: DC

## 2019-09-26 MED ORDER — AMLODIPINE BESYLATE 10 MG PO TABS: 10.0000 mg | ORAL_TABLET | Freq: Every day | ORAL | 2 refills | Status: DC

## 2019-10-03 ENCOUNTER — Encounter: Payer: Self-pay | Admitting: Family Medicine

## 2019-10-03 ENCOUNTER — Other Ambulatory Visit: Payer: Self-pay

## 2019-10-03 ENCOUNTER — Ambulatory Visit (INDEPENDENT_AMBULATORY_CARE_PROVIDER_SITE_OTHER): Payer: Medicare HMO | Admitting: Family Medicine

## 2019-10-03 VITALS — BP 142/79 | HR 79 | Temp 97.1°F | Ht 69.0 in | Wt 217.4 lb

## 2019-10-03 DIAGNOSIS — D649 Anemia, unspecified: Secondary | ICD-10-CM | POA: Insufficient documentation

## 2019-10-03 DIAGNOSIS — N401 Enlarged prostate with lower urinary tract symptoms: Secondary | ICD-10-CM | POA: Diagnosis not present

## 2019-10-03 DIAGNOSIS — E079 Disorder of thyroid, unspecified: Secondary | ICD-10-CM

## 2019-10-03 DIAGNOSIS — E538 Deficiency of other specified B group vitamins: Secondary | ICD-10-CM | POA: Diagnosis not present

## 2019-10-03 DIAGNOSIS — R3911 Hesitancy of micturition: Secondary | ICD-10-CM

## 2019-10-03 DIAGNOSIS — Z9119 Patient's noncompliance with other medical treatment and regimen: Secondary | ICD-10-CM

## 2019-10-03 DIAGNOSIS — Z7289 Other problems related to lifestyle: Secondary | ICD-10-CM

## 2019-10-03 DIAGNOSIS — Z789 Other specified health status: Secondary | ICD-10-CM

## 2019-10-03 DIAGNOSIS — I10 Essential (primary) hypertension: Secondary | ICD-10-CM

## 2019-10-03 DIAGNOSIS — C61 Malignant neoplasm of prostate: Secondary | ICD-10-CM | POA: Diagnosis not present

## 2019-10-03 DIAGNOSIS — E7800 Pure hypercholesterolemia, unspecified: Secondary | ICD-10-CM

## 2019-10-03 DIAGNOSIS — Z91199 Patient's noncompliance with other medical treatment and regimen due to unspecified reason: Secondary | ICD-10-CM

## 2019-10-03 DIAGNOSIS — R350 Frequency of micturition: Secondary | ICD-10-CM | POA: Diagnosis not present

## 2019-10-03 MED ORDER — AMLODIPINE BESYLATE 10 MG PO TABS: 10.0000 mg | ORAL_TABLET | Freq: Every day | ORAL | 1 refills | Status: DC

## 2019-10-03 MED ORDER — TAMSULOSIN HCL 0.4 MG PO CAPS: 0.8000 mg | ORAL_CAPSULE | Freq: Every day | ORAL | 2 refills | Status: DC

## 2019-10-03 MED ORDER — ATORVASTATIN CALCIUM 20 MG PO TABS: 20.0000 mg | ORAL_TABLET | Freq: Every day | ORAL | 3 refills | Status: DC

## 2019-10-03 NOTE — Progress Notes (Addendum)
Established Patient Office Visit  Subjective:  Patient ID: Logan Orr, male    DOB: [birth year]  Age: 69 y.o. MRN: [id number]  CC:  Chief Complaint  Patient presents with  . Follow-up    Refill/follow up on medications.     HPI Logan Orr presents for follow-up of his hypertension, elevated cholesterol and prostate cancer.  Says that he has not taken his Norvasc in 2 or 3 days.  Continues to take his atorvastatin.  Continues with prostate seeding and tells of urinary hesitancy first thing in the morning.  He is taking 0.4 mg dose of Flomax.  Hesitant to have the Covid vaccine.  Past Medical History:  Diagnosis Date  . Hemorrhoids   . History of colon polyps   . HLD (hyperlipidemia)   . Hyperplasia of prostate with lower urinary tract symptoms (LUTS)   . Hypertension   . Prostate cancer Logan Orr) urologist-- Logan Orr/  oncologist-- Logan [facility]   dx 11-23-2018  Stage T1c,  3+4  . Thyroid nodule    08-03-2018  pt pcp note in epic, left side, ultasound ordered   . Wears glasses     Past Surgical History:  Procedure Laterality Date  . CYSTOSCOPY N/A 02/04/2019   Procedure: CYSTOSCOPY;  Surgeon: Logan Orr;  Location: [facility];  Service: Urology;  Laterality: N/A;  no seeds seen per Logan [facility]r  . LACERATION REPAIR  1990s   repair left lower arm complex laceration   . PROSTATE BIOPSY  11-23-2018   at [facility] office  . RADIOACTIVE SEED IMPLANT N/A 02/04/2019   Procedure: RADIOACTIVE SEED IMPLANT/BRACHYTHERAPY IMPLANT;  Surgeon: Logan Orr;  Location: [facility];  Service: Urology;  Laterality: N/A;  90 seeds  . SPACE OAR INSTILLATION N/A 02/04/2019   Procedure: SPACE OAR INSTILLATION;  Surgeon: Logan Orr;  Location: [facility];  Service: Urology;  Laterality: N/A;    Family History  Adopted: Yes  Problem Relation Age of Onset  . Cancer Neg Hx     Social History   Socioeconomic History  .  Marital status: Single    Spouse name: Not on file  . Number of children: 1  . Years of education: Not on file  . Highest education level: Not on file  Occupational History    Comment: unemployed   Tobacco Use  . Smoking status: Never Smoker  . Smokeless tobacco: Never Used  Substance and Sexual Activity  . Alcohol use: Yes    Alcohol/week: 35.0 standard drinks    Types: 35 Cans of beer per week    Comment: 4 to 5 beers daily  . Drug use: Never  . Sexual activity: Yes  Other Topics Concern  . Not on file  Social History Narrative  . Not on file   Social Determinants of Health   Financial Resource Strain:   . Difficulty of Paying Living Expenses: Not on file  Food Insecurity:   . Worried About Charity fundraiser in the Last Year: Not on file  . Ran Out of Food in the Last Year: Not on file  Transportation Needs:   . Lack of Transportation (Medical): Not on file  . Lack of Transportation (Non-Medical): Not on file  Physical Activity:   . Days of Exercise per Week: Not on file  . Minutes of Exercise per Session: Not on file  Stress:   . Feeling of Stress : Not on  file  Social Connections:   . Frequency of Communication with Friends and Family: Not on file  . Frequency of Social Gatherings with Friends and Family: Not on file  . Attends Religious Services: Not on file  . Active Member of Clubs or Organizations: Not on file  . Attends Archivist Meetings: Not on file  . Marital Status: Not on file  Intimate Partner Violence:   . Fear of Current or Ex-Partner: Not on file  . Emotionally Abused: Not on file  . Physically Abused: Not on file  . Sexually Abused: Not on file    Outpatient Medications Prior to Visit  Medication Sig Dispense Refill  . amLODipine (NORVASC) 10 MG tablet Take 1 tablet (10 mg total) by mouth daily. 90 tablet 2  . atorvastatin (LIPITOR) 20 MG tablet Take 1 tablet (20 mg total) by mouth daily. 90 tablet 3  . tamsulosin (FLOMAX) 0.4 MG  CAPS capsule Take 1 capsule (0.4 mg total) by mouth daily. (Patient taking differently: Take 0.4 mg by mouth daily. ) 30 capsule 3  . ketorolac (ACULAR) 0.5 % ophthalmic solution INSTILL 1 DROP INTO LEFT EYE 4 TIMES DAILY     No facility-administered medications prior to visit.    No Known Allergies  ROS Review of Systems  Constitutional: Negative.   HENT: Negative.   Eyes: Negative for photophobia and visual disturbance.  Respiratory: Negative.   Cardiovascular: Negative.   Gastrointestinal: Negative.   Endocrine: Negative for polyphagia and polyuria.  Genitourinary: Negative.   Musculoskeletal: Negative for gait problem and joint swelling.  Allergic/Immunologic: Negative for immunocompromised state.  Neurological: Negative for light-headedness and numbness.  Hematological: Does not bruise/bleed easily.  Psychiatric/Behavioral: Negative.       Objective:    Physical Exam  Constitutional: He is oriented to person, place, and time. He appears well-developed and well-nourished. No distress.  HENT:  Head: Normocephalic and atraumatic.  Right Ear: External ear normal.  Left Ear: External ear normal.  Mouth/Throat: Oropharynx is clear and moist. No oropharyngeal exudate.  Eyes: Conjunctivae are normal. Right eye exhibits no discharge. Left eye exhibits no discharge. No scleral icterus.  Neck: No JVD present. No tracheal deviation present.  Cardiovascular: Normal rate, regular rhythm and normal heart sounds.  Pulmonary/Chest: Effort normal and breath sounds normal. No stridor.  Neurological: He is alert and oriented to person, place, and time.  Skin: Skin is warm and dry. He is not diaphoretic.  Psychiatric: He has a normal mood and affect. His behavior is normal.    BP (!) 142/79   Pulse 79   Temp (!) 97.1 F (36.2 C) (Tympanic)   Ht 5\' 9"  (1.753 m)   Wt 217 lb 6.4 oz (98.6 kg)   SpO2 96%   BMI 32.10 kg/m  Wt Readings from Last 3 Encounters:  10/03/19 217 lb 6.4 oz  (98.6 kg)  03/18/19 204 lb (92.5 kg)  03/14/19 206 lb 2 oz (93.5 kg)     Health Maintenance Due  Topic Date Due  . Hepatitis C Screening  [birth year]  . TETANUS/TDAP  11/30/1970  . PNA vac Low Risk Adult (1 of 2 - PCV13) 11/29/2016    There are no preventive care reminders to display for this patient.  Lab Results  Component Value Date   TSH 0.39 03/14/2019   Lab Results  Component Value Date   WBC 4.3 10/03/2019   HGB 13.7 10/03/2019   HCT 41.9 10/03/2019   MCV 89.2 10/03/2019   PLT  302.0 10/03/2019   Lab Results  Component Value Date   NA 141 10/03/2019   K 4.1 10/03/2019   CO2 27 10/03/2019   GLUCOSE 91 10/03/2019   BUN 15 10/03/2019   CREATININE 1.38 10/03/2019   BILITOT 0.6 10/03/2019   ALKPHOS 100 10/03/2019   AST 25 10/03/2019   ALT 23 10/03/2019   PROT 7.1 10/03/2019   ALBUMIN 4.3 10/03/2019   CALCIUM 9.8 10/03/2019   ANIONGAP 10 01/31/2019   GFR 62.03 10/03/2019   Lab Results  Component Value Date   CHOL 281 (H) 08/03/2018   Lab Results  Component Value Date   HDL 55 08/03/2018   Lab Results  Component Value Date   LDLCALC 199 (H) 08/03/2018   Lab Results  Component Value Date   TRIG 122 08/03/2018   Lab Results  Component Value Date   CHOLHDL 5.1 (H) 08/03/2018   No results found for: HGBA1C    Assessment & Plan:   Problem List Items Addressed This Visit      Cardiovascular and Mediastinum   Essential hypertension - Primary   Relevant Medications   amLODipine (NORVASC) 10 MG tablet   atorvastatin (LIPITOR) 20 MG tablet   Other Relevant Orders   CBC (Completed)   Comprehensive metabolic panel (Completed)   Urinalysis, Routine w reflex microscopic (Completed)     Genitourinary   Benign prostatic hyperplasia with urinary hesitancy   Relevant Medications   tamsulosin (FLOMAX) 0.4 MG CAPS capsule     Other   Thyroid mass   Elevated LDL cholesterol level   Relevant Medications   atorvastatin (LIPITOR) 20 MG tablet   Other  Relevant Orders   LDL cholesterol, direct (Completed)   Alcohol use   Non-compliant patient   Anemia   Relevant Medications   b complex vitamins capsule   Other Relevant Orders   Iron, TIBC and Ferritin Panel (Completed)   B12 and Folate Panel (Completed)   B12 deficiency   Relevant Medications   b complex vitamins capsule      Meds ordered this encounter  Medications  . amLODipine (NORVASC) 10 MG tablet    Sig: Take 1 tablet (10 mg total) by mouth daily.    Dispense:  90 tablet    Refill:  1  . atorvastatin (LIPITOR) 20 MG tablet    Sig: Take 1 tablet (20 mg total) by mouth daily.    Dispense:  90 tablet    Refill:  3  . tamsulosin (FLOMAX) 0.4 MG CAPS capsule    Sig: Take 2 capsules (0.8 mg total) by mouth daily after supper.    Dispense:  60 capsule    Refill:  2  . b complex vitamins capsule    Sig: Take 1 capsule by mouth daily.    Dispense:  90 capsule    Refill:  1    Follow-up: Return in about 3 months (around 12/31/2019), or check and record blood pressures. bring all medicines with you. go for your thyroid ultrasound.. Discussed the importance of the Covid vaccine for him.  We will increase the Flomax to 0.8 mg daily.  Suggested that he try it at night before bed.   Libby Maw, [facility]

## 2019-10-04 LAB — COMPREHENSIVE METABOLIC PANEL
ALT: 23 U/L (ref 0–53)
AST: 25 U/L (ref 0–37)
Albumin: 4.3 g/dL (ref 3.5–5.2)
Alkaline Phosphatase: 100 U/L (ref 39–117)
BUN: 15 mg/dL (ref 6–23)
CO2: 27 mEq/L (ref 19–32)
Calcium: 9.8 mg/dL (ref 8.4–10.5)
Chloride: 105 mEq/L (ref 96–112)
Creatinine, Ser: 1.38 mg/dL (ref 0.40–1.50)
GFR: 62.03 mL/min (ref >60.00)
Glucose, Bld: 91 mg/dL (ref 70–99)
Potassium: 4.1 mEq/L (ref 3.5–5.1)
Sodium: 141 mEq/L (ref 135–145)
Total Bilirubin: 0.6 mg/dL (ref 0.2–1.2)
Total Protein: 7.1 g/dL (ref 6.0–8.3)

## 2019-10-04 LAB — URINALYSIS, ROUTINE W REFLEX MICROSCOPIC
Bilirubin Urine: NEGATIVE
Ketones, ur: NEGATIVE
Leukocytes,Ua: NEGATIVE
Nitrite: NEGATIVE
Specific Gravity, Urine: 1.025 (ref 1.000–1.030)
Urine Glucose: NEGATIVE
Urobilinogen, UA: 1 (ref 0.0–1.0)
pH: 5.5 (ref 5.0–8.0)

## 2019-10-04 LAB — CBC
HCT: 41.9 % (ref 39.0–52.0)
Hemoglobin: 13.7 g/dL (ref 13.0–17.0)
MCHC: 32.7 g/dL (ref 30.0–36.0)
MCV: 89.2 fl (ref 78.0–100.0)
Platelets: 302 10*3/uL (ref 150.0–400.0)
RBC: 4.7 Mil/uL (ref 4.22–5.81)
RDW: 14.3 % (ref 11.5–15.5)
WBC: 4.3 10*3/uL (ref 4.0–10.5)

## 2019-10-04 LAB — B12 AND FOLATE PANEL
Folate: 9.8 ng/mL (ref >5.9)
Vitamin B-12: 160 pg/mL — ABNORMAL LOW (ref 211–911)

## 2019-10-04 LAB — LDL CHOLESTEROL, DIRECT: Direct LDL: 87 mg/dL

## 2019-10-04 LAB — IRON,TIBC AND FERRITIN PANEL
%SAT: 26 % (calc) (ref 20–48)
Ferritin: 177 ng/mL (ref 24–380)
Iron: 88 ug/dL (ref 50–180)
TIBC: 336 mcg/dL (calc) (ref 250–425)

## 2019-10-07 DIAGNOSIS — E538 Deficiency of other specified B group vitamins: Secondary | ICD-10-CM | POA: Insufficient documentation

## 2019-10-07 MED ORDER — B COMPLEX VITAMINS PO CAPS: 1.0000 | ORAL_CAPSULE | Freq: Every day | ORAL | 1 refills | Status: DC

## 2019-10-07 NOTE — Addendum Note (Signed)
Addended by: Abelino Derrick A on: 10/07/2019 11:08 AM   Modules accepted: Orders

## 2019-10-08 ENCOUNTER — Telehealth: Payer: Self-pay | Admitting: Family Medicine

## 2019-10-08 NOTE — Telephone Encounter (Signed)
Patient is returning the call. Pt stated that he will unavailable until 530 and vm can be left on [phone]

## 2019-10-31 ENCOUNTER — Telehealth: Payer: Self-pay | Admitting: Family Medicine

## 2019-10-31 NOTE — Telephone Encounter (Signed)
Left message for patient to schedule Annual Wellness Visit.  Please schedule with Nurse Health Advisor Victoria Britt, RN at [facility]  

## 2019-12-18 ENCOUNTER — Telehealth: Payer: Self-pay | Admitting: Family Medicine

## 2019-12-18 NOTE — Progress Notes (Signed)
°  Chronic Care Management   Outreach Note  12/18/2019 Name: Bernhard Speak MRN: [id number] DOB: 1950-09-30  Referred by: Libby Maw, MD Reason for referral : No chief complaint on file.   An unsuccessful telephone outreach was attempted today. The patient was referred to the pharmacist for assistance with care management and care coordination.   This note is not being shared with the patient for the following reason: To respect privacy (The patient or proxy has requested that the information not be shared).  Follow Up Plan:   Earney [place] [facility] Scheduler

## 2020-01-10 DIAGNOSIS — R05 Cough: Secondary | ICD-10-CM | POA: Diagnosis not present

## 2020-01-10 DIAGNOSIS — Z20822 Contact with and (suspected) exposure to covid-19: Secondary | ICD-10-CM | POA: Diagnosis not present

## 2020-01-10 DIAGNOSIS — R0989 Other specified symptoms and signs involving the circulatory and respiratory systems: Secondary | ICD-10-CM | POA: Diagnosis not present

## 2020-01-14 ENCOUNTER — Telehealth: Payer: Self-pay | Admitting: Family Medicine

## 2020-01-14 ENCOUNTER — Other Ambulatory Visit: Payer: Self-pay

## 2020-01-14 DIAGNOSIS — I10 Essential (primary) hypertension: Secondary | ICD-10-CM

## 2020-01-14 MED ORDER — AMLODIPINE BESYLATE 10 MG PO TABS: 10.0000 mg | ORAL_TABLET | Freq: Every day | ORAL | 1 refills | Status: DC

## 2020-01-14 NOTE — Telephone Encounter (Signed)
Rx sent in left message to inform patient

## 2020-01-14 NOTE — Telephone Encounter (Signed)
Patient is calling and requesting a refill for amlodipine sent to [facility] in [place]. CB is [phone].

## 2020-01-15 ENCOUNTER — Ambulatory Visit: Payer: Medicare HMO | Admitting: Family Medicine

## 2020-01-24 DIAGNOSIS — N401 Enlarged prostate with lower urinary tract symptoms: Secondary | ICD-10-CM | POA: Diagnosis not present

## 2020-01-24 DIAGNOSIS — R3911 Hesitancy of micturition: Secondary | ICD-10-CM | POA: Diagnosis not present

## 2020-01-24 DIAGNOSIS — R350 Frequency of micturition: Secondary | ICD-10-CM | POA: Diagnosis not present

## 2020-01-24 DIAGNOSIS — R351 Nocturia: Secondary | ICD-10-CM | POA: Diagnosis not present

## 2020-01-24 DIAGNOSIS — C61 Malignant neoplasm of prostate: Secondary | ICD-10-CM | POA: Diagnosis not present

## 2020-01-27 ENCOUNTER — Other Ambulatory Visit: Payer: Self-pay

## 2020-01-28 ENCOUNTER — Ambulatory Visit (INDEPENDENT_AMBULATORY_CARE_PROVIDER_SITE_OTHER): Payer: Medicare HMO | Admitting: Family Medicine

## 2020-01-28 ENCOUNTER — Encounter: Payer: Self-pay | Admitting: Family Medicine

## 2020-01-28 VITALS — BP 124/84 | HR 82 | Temp 98.0°F | Wt 213.4 lb

## 2020-01-28 DIAGNOSIS — K5901 Slow transit constipation: Secondary | ICD-10-CM

## 2020-01-28 DIAGNOSIS — Z9119 Patient's noncompliance with other medical treatment and regimen: Secondary | ICD-10-CM | POA: Diagnosis not present

## 2020-01-28 DIAGNOSIS — I10 Essential (primary) hypertension: Secondary | ICD-10-CM | POA: Diagnosis not present

## 2020-01-28 DIAGNOSIS — E7800 Pure hypercholesterolemia, unspecified: Secondary | ICD-10-CM

## 2020-01-28 DIAGNOSIS — R3911 Hesitancy of micturition: Secondary | ICD-10-CM

## 2020-01-28 DIAGNOSIS — Z7289 Other problems related to lifestyle: Secondary | ICD-10-CM

## 2020-01-28 DIAGNOSIS — E538 Deficiency of other specified B group vitamins: Secondary | ICD-10-CM | POA: Diagnosis not present

## 2020-01-28 DIAGNOSIS — Z789 Other specified health status: Secondary | ICD-10-CM

## 2020-01-28 DIAGNOSIS — K625 Hemorrhage of anus and rectum: Secondary | ICD-10-CM

## 2020-01-28 DIAGNOSIS — N401 Enlarged prostate with lower urinary tract symptoms: Secondary | ICD-10-CM

## 2020-01-28 DIAGNOSIS — Z91199 Patient's noncompliance with other medical treatment and regimen due to unspecified reason: Secondary | ICD-10-CM

## 2020-01-28 MED ORDER — TAMSULOSIN HCL 0.4 MG PO CAPS: 0.8000 mg | ORAL_CAPSULE | Freq: Every day | ORAL | 3 refills | Status: DC

## 2020-01-28 MED ORDER — DOCUSATE SODIUM 100 MG PO CAPS: 100.0000 mg | ORAL_CAPSULE | Freq: Two times a day (BID) | ORAL | 0 refills | Status: DC

## 2020-01-28 MED ORDER — AMLODIPINE BESYLATE 10 MG PO TABS: 10.0000 mg | ORAL_TABLET | Freq: Every day | ORAL | 1 refills | Status: DC

## 2020-01-28 MED ORDER — B COMPLEX VITAMINS PO CAPS: 1.0000 | ORAL_CAPSULE | Freq: Every day | ORAL | 1 refills | Status: DC

## 2020-01-28 MED ORDER — ATORVASTATIN CALCIUM 20 MG PO TABS: 20.0000 mg | ORAL_TABLET | Freq: Every day | ORAL | 3 refills | Status: DC

## 2020-01-28 NOTE — Patient Instructions (Signed)
Constipation, Adult Constipation is when a person:  Poops (has a bowel movement) fewer times in a week than normal.  Has a hard time pooping.  Has poop that is dry, hard, or bigger than normal. Follow these instructions at home: Eating and drinking   Eat foods that have a lot of fiber, such as: ? Fresh fruits and vegetables. ? Whole grains. ? Beans.  Eat less of foods that are high in fat, low in fiber, or overly processed, such as: ? [place] fries. ? Hamburgers. ? Cookies. ? Candy. ? Soda.  Drink enough fluid to keep your pee (urine) clear or pale yellow. General instructions  Exercise regularly or as told by your doctor.  Go to the restroom when you feel like you need to poop. Do not hold it in.  Take over-the-counter and prescription medicines only as told by your doctor. These include any fiber supplements.  Do pelvic floor retraining exercises, such as: ? Doing deep breathing while relaxing your lower belly (abdomen). ? Relaxing your pelvic floor while pooping.  Watch your condition for any changes.  Keep all follow-up visits as told by your doctor. This is important. Contact a doctor if:  You have pain that gets worse.  You have a fever.  You have not pooped for 4 days.  You throw up (vomit).  You are not hungry.  You lose weight.  You are bleeding from the anus.  You have thin, pencil-like poop (stool). Get help right away if:  You have a fever, and your symptoms suddenly get worse.  You leak poop or have blood in your poop.  Your belly feels hard or bigger than normal (is bloated).  You have very bad belly pain.  You feel dizzy or you faint. This information is not intended to replace advice given to you by your health care provider. Make sure you discuss any questions you have with your health care provider. Document Revised: 07/21/2017 Document Reviewed: 01/27/2016 Elsevier Patient Education  [place] [facility]  Bleeding  Lower gastrointestinal (GI) bleeding is the result of bleeding from the colon, rectum, or anal area. The colon is the last part of the digestive tract, where stool, also called feces, is formed. If you have lower GI bleeding, you may see blood in or on your stool. It may be bright red. Lower GI bleeding often stops without treatment. Continued or heavy bleeding needs emergency treatment at the hospital. What are the causes? Lower GI bleeding may be caused by:  A condition that causes pouches to form in the colon over time (diverticulosis).  Swelling and irritation (inflammation) in areas with diverticulosis (diverticulitis).  Inflammation of the colon (inflammatory bowel disease).  Swollen veins in the rectum (hemorrhoids).  Painful tears in the anus (anal fissures), often caused by passing hard stools.  Cancer of the colon or rectum.  Noncancerous growths (polyps) of the colon or rectum.  A bleeding disorder that impairs the formation of blood clots and causes easy bleeding (coagulopathy).  An abnormal weakening of a blood vessel where an artery and a vein come together (arteriovenous malformation). What increases the risk? You are more likely to develop this condition if:  You are older than 68 years of age.  You take aspirin or NSAIDs on a regular basis.  You take anticoagulant or antiplatelet drugs.  You have a history of high-dose X-ray treatment (radiation therapy) of the colon.  You recently had a colon polyp removed. What are the signs  or symptoms? Symptoms of this condition include:  Bright red blood or blood clots coming from your rectum.  Bloody stools.  Black or maroon-colored stools.  Pain or cramping in the abdomen.  Weakness or dizziness.  Racing heartbeat. How is this diagnosed? This condition may be diagnosed based on:  Your symptoms and medical history.  A physical exam. During the exam, your health care provider will check for signs  of blood loss, such as low blood pressure and a rapid pulse.  Tests, such as: ? Flexible sigmoidoscopy. In this procedure, a flexible tube with a camera on the end is used to examine your anus and the first part of your colon to look for the source of bleeding. ? Colonoscopy. This is similar to a flexible sigmoidoscopy, but the camera can extend all the way to the uppermost part of your colon. ? Blood tests to measure your red blood cell count and to check for coagulopathy. ? An imaging study of your colon to look for a bleeding site. In some cases, you may have X-rays taken after a dye or radioactive substance is injected into your bloodstream (angiogram). How is this treated? Treatment for this condition depends on the cause of the bleeding. Heavy or persistent bleeding is treated at the hospital. Treatment may include:  Getting fluids through an IV tube inserted into one of your veins.  Getting blood through an IV tube (blood transfusion).  Stopping bleeding through high-heat coagulation, injections of certain medicines, or applying surgical clips. This can all be done during a colonoscopy.  Having a procedure that involves first doing an angiogram and then blocking blood flow to the bleeding site (embolization).  Stopping some of your regular medicines for a certain amount of time.  Having surgery to remove part of the colon. This may be needed if bleeding is severe and does not respond to other treatment. Follow these instructions at home:  Take over-the-counter and prescription medicines only as told by your health care provider. You may need to avoid aspirin, NSAIDs, or other medicines that increase bleeding.  Eat foods that are high in fiber. This will help keep your stools soft. These foods include whole grains, legumes, fruits, and vegetables. Eating 1-3 prunes each day works well for many people.  Drink enough fluid to keep your urine clear or pale yellow.  Keep all follow-up  visits as told by your health care provider. This is important. Contact a health care provider if:  Your symptoms do not improve. Get help right away if:  Your bleeding increases.  You feel light-headed or you faint.  You feel weak.  You have severe cramps in your back or abdomen.  You pass large blood clots in your stool.  Your symptoms get worse. This information is not intended to replace advice given to you by your health care provider. Make sure you discuss any questions you have with your health care provider. Document Revised: 11/30/2018 Document Reviewed: 12/24/2015 Elsevier Patient Education  [place].  Vitamin B12 Deficiency Vitamin B12 deficiency means that your body does not have enough vitamin B12. The body needs this vitamin:  To make red blood cells.  To make genes (DNA).  To help the nerves work. If you do not have enough vitamin B12 in your body, you can have health problems. What are the causes?  Not eating enough foods that contain vitamin B12.  Not being able to absorb vitamin B12 from the food that you eat.  Certain  digestive system diseases.  A condition in which the body does not make enough of a certain protein, which results in too few red blood cells (pernicious anemia).  Having a surgery in which part of the stomach or small intestine is removed.  Taking medicines that make it hard for the body to absorb vitamin B12. These medicines include: ? Heartburn medicines. ? Some antibiotic medicines. ? Other medicines that are used to treat certain conditions. What increases the risk?  Being older than age 34.  Eating a vegetarian or vegan diet, especially while you are pregnant.  Eating a poor diet while you are pregnant.  Taking certain medicines.  Having alcoholism. What are the signs or symptoms? In some cases, there are no symptoms. If the condition leads to too few blood cells or nerve damage, symptoms can occur, such  as:  Feeling weak.  Feeling tired (fatigued).  Not being hungry.  Weight loss.  A loss of feeling (numbness) or tingling in your hands and feet.  Redness and burning of the tongue.  Being mixed up (confused) or having memory problems.  Sadness (depression).  Problems with your senses. This can include color blindness, ringing in the ears, or loss of taste.  Watery poop (diarrhea) or trouble pooping (constipation).  Trouble walking. If anemia is very bad, symptoms can include:  Being short of breath.  Being dizzy.  Having a very fast heartbeat. How is this treated?  Changing the way you eat and drink, such as: ? Eating more foods that contain vitamin B12. ? Drinking little or no alcohol.  Getting vitamin B12 shots.  Taking vitamin B12 supplements. Your doctor will tell you the dose that is best for you. Follow these instructions at home: Eating and drinking   Eat lots of healthy foods that contain vitamin B12. These include: ? Meats and poultry, such as beef, pork, chicken, [place], and organ meats, such as liver. ? Seafood, such as clams, rainbow trout, salmon, tuna, and haddock. ? Eggs. ? Cereal and dairy products that have vitamin B12 added to them. Check the label. The items listed above may not be a complete list of what you can eat and drink. Contact a dietitian for more options. General instructions  Get any shots as told by your doctor.  Take supplements only as told by your doctor.  Do not drink alcohol if your doctor tells you not to. In some cases, you may only be asked to limit alcohol use.  Keep all follow-up visits as told by your doctor. This is important. Contact a doctor if:  Your symptoms come back. Get help right away if:  You have trouble breathing.  You have a very fast heartbeat.  You have chest pain.  You get dizzy.  You pass out. Summary  Vitamin B12 deficiency means that your body is not getting enough vitamin B12.  In  some cases, there are no symptoms of this condition.  Treatment may include making a change in the way you eat and drink, getting vitamin B12 shots, or taking supplements.  Eat lots of healthy foods that contain vitamin B12. This information is not intended to replace advice given to you by your health care provider. Make sure you discuss any questions you have with your health care provider. Document Revised: 04/17/2018 Document Reviewed: 04/17/2018 Elsevier Patient Education  2020 [employer].

## 2020-01-28 NOTE — Progress Notes (Signed)
Established Patient Office Visit  Subjective:  Patient ID: [facility], male    DOB: June 02, 1951  Age: 69 y.o. MRN: [id number]  CC:  Chief Complaint  Patient presents with  . Hypertension    Patient is here today for a 35-month-F/U. At 2.11.2021 visit he had not been compliant with Norvasc. He was asked to keep a record of his BP's and bring meds at next visit.  He was asked to get U/S of thyroid which was not completed and says he doesn't have time. BP other day was 160/89 and states that he has been compliant with the Norvasc.  Marland Kitchen Hyperlipidemia    He is also following up with HLD. He is compliant with Atorvastatin.  . Prostate Cancer    At 2.11.20 visit he stated that he had continued with prostate seeding and still had issues with morning urinary hesitancy. Flomax was increased to 0.8mg  and pt advised to take at bedtime. He states that the increased dosage is ok but he feels he needs something stronger.    HPI [facility] presents for follow-up of his hypertension, elevated cholesterol, BPH with urinary hesitancy, B12 deficiency and now complains of constipation with rectal bleeding. There is blood in the toilet bowl and blood with wiping. Status post recent colonoscopy in 2018. Was advised to return in 2023. Results of colonoscopy not in the chart. History of prostate cancer treated with radioactive seed implant in June of this past year. Tells that he has had issues with constipation since that time. Cannot tell whether or not higher dose of Flomax is helping his urine flow but he sure misses it if he stops it. Urologist is aware. He never filled the prescription for B12. He now admits to 2-3 beers consumed nightly.  Past Medical History:  Diagnosis Date  . Hemorrhoids   . History of colon polyps   . HLD (hyperlipidemia)   . Hyperplasia of prostate with lower urinary tract symptoms (LUTS)   . Hypertension   . Prostate cancer [facility]) urologist-- dr bell/  oncologist-- dr Tammi Klippel   dx 11-23-2018  Stage T1c,  3+4  . Thyroid nodule    08-03-2018  pt pcp note in epic, left side, ultasound ordered   . Wears glasses     Past Surgical History:  Procedure Laterality Date  . CYSTOSCOPY N/A 02/04/2019   Procedure: CYSTOSCOPY;  Surgeon: Lucas Mallow, MD;  Location: [facility];  Service: Urology;  Laterality: N/A;  no seeds seen per Dr Gloriann Loan  . LACERATION REPAIR  1990s   repair left lower arm complex laceration   . PROSTATE BIOPSY  11-23-2018   at dr Gloriann Loan office  . RADIOACTIVE SEED IMPLANT N/A 02/04/2019   Procedure: RADIOACTIVE SEED IMPLANT/BRACHYTHERAPY IMPLANT;  Surgeon: Lucas Mallow, MD;  Location: [facility];  Service: Urology;  Laterality: N/A;  90 seeds  . SPACE OAR INSTILLATION N/A 02/04/2019   Procedure: SPACE OAR INSTILLATION;  Surgeon: Lucas Mallow, MD;  Location: [facility];  Service: Urology;  Laterality: N/A;    Family History  Adopted: Yes  Problem Relation Age of Onset  . Cancer Neg Hx     Social History   Socioeconomic History  . Marital status: Single    Spouse name: Not on file  . Number of children: 1  . Years of education: Not on file  . Highest education level: Not on file  Occupational History    Comment: unemployed  Tobacco Use  . Smoking status: Never Smoker  . Smokeless tobacco: Never Used  Substance and Sexual Activity  . Alcohol use: Yes    Alcohol/week: 21.0 standard drinks    Types: 21 Cans of beer per week    Comment: 2-3 beers daily  . Drug use: Never  . Sexual activity: Yes  Other Topics Concern  . Not on file  Social History Narrative  . Not on file   Social Determinants of Health   Financial Resource Strain:   . Difficulty of Paying Living Expenses:   Food Insecurity:   . Worried About Charity fundraiser in the Last Year:   . Arboriculturist in the Last Year:   Transportation Needs:   . Film/video editor (Medical):   Marland Kitchen Lack of Transportation  (Non-Medical):   Physical Activity:   . Days of Exercise per Week:   . Minutes of Exercise per Session:   Stress:   . Feeling of Stress :   Social Connections:   . Frequency of Communication with Friends and Family:   . Frequency of Social Gatherings with Friends and Family:   . Attends Religious Services:   . Active Member of Clubs or Organizations:   . Attends Archivist Meetings:   Marland Kitchen Marital Status:   Intimate Partner Violence:   . Fear of Current or Ex-Partner:   . Emotionally Abused:   Marland Kitchen Physically Abused:   . Sexually Abused:     Outpatient Medications Prior to Visit  Medication Sig Dispense Refill  . ketorolac (ACULAR) 0.5 % ophthalmic solution INSTILL 1 DROP INTO LEFT EYE 4 TIMES DAILY    . amLODipine (NORVASC) 10 MG tablet Take 1 tablet (10 mg total) by mouth daily. 90 tablet 1  . atorvastatin (LIPITOR) 20 MG tablet Take 1 tablet (20 mg total) by mouth daily. 90 tablet 3  . tamsulosin (FLOMAX) 0.4 MG CAPS capsule Take 2 capsules (0.8 mg total) by mouth daily after supper. 60 capsule 2  . b complex vitamins capsule Take 1 capsule by mouth daily. (Patient not taking: Reported on 01/28/2020) 90 capsule 1   No facility-administered medications prior to visit.    No Known Allergies  ROS Review of Systems  Constitutional: Negative for diaphoresis, fever and unexpected weight change.  HENT: Negative.   Eyes: Negative for photophobia and visual disturbance.  Respiratory: Negative.   Cardiovascular: Negative.   Gastrointestinal: Positive for anal bleeding, blood in stool and constipation. Negative for abdominal pain and rectal pain.  Endocrine: Negative for polyphagia and polyuria.  Genitourinary: Positive for difficulty urinating and frequency.  Skin: Negative for pallor and rash.  Allergic/Immunologic: Negative for immunocompromised state.  Neurological: Negative for light-headedness and numbness.  Hematological: Does not bruise/bleed easily.    Psychiatric/Behavioral: Negative.       Objective:    Physical Exam  Constitutional: He is oriented to person, place, and time. He appears well-developed and well-nourished. No distress.  HENT:  Head: Normocephalic and atraumatic.  Right Ear: External ear normal.  Left Ear: External ear normal.  Eyes: Pupils are equal, round, and reactive to light. Conjunctivae are normal. Right eye exhibits no discharge. Left eye exhibits no discharge. No scleral icterus.  Neck: No JVD present. No tracheal deviation present. No thyromegaly present.  Cardiovascular: Normal rate, regular rhythm and normal heart sounds.  Pulmonary/Chest: Effort normal and breath sounds normal. No stridor.  Abdominal: Bowel sounds are normal.  Genitourinary: Rectum:     Guaiac  result negative.     External hemorrhoid present.     No rectal mass, anal fissure, tenderness, internal hemorrhoid or abnormal anal tone.  Prostate is enlarged. Prostate is not tender.     Musculoskeletal:        General: No edema.     Cervical back: Neck supple.  Lymphadenopathy:    He has no cervical adenopathy.  Neurological: He is alert and oriented to person, place, and time.  Skin: Skin is warm and dry. He is not diaphoretic.  Psychiatric: He has a normal mood and affect. His behavior is normal.    BP 124/84 (BP Location: Left Arm, Patient Position: Sitting, Cuff Size: Normal)   Pulse 82   Temp 98 F (36.7 C) (Temporal)   Wt 213 lb 6.4 oz (96.8 kg)   SpO2 97%   BMI 31.51 kg/m  Wt Readings from Last 3 Encounters:  01/28/20 213 lb 6.4 oz (96.8 kg)  10/03/19 217 lb 6.4 oz (98.6 kg)  03/18/19 204 lb (92.5 kg)     Health Maintenance Due  Topic Date Due  . Hepatitis C Screening  Never done  . COVID-19 Vaccine (1) Never done  . TETANUS/TDAP  Never done  . PNA vac Low Risk Adult (1 of 2 - PCV13) Never done    There are no preventive care reminders to display for this patient.  Lab Results  Component Value Date   TSH  0.39 03/14/2019   Lab Results  Component Value Date   WBC 4.3 10/03/2019   HGB 13.7 10/03/2019   HCT 41.9 10/03/2019   MCV 89.2 10/03/2019   PLT 302.0 10/03/2019   Lab Results  Component Value Date   NA 141 10/03/2019   K 4.1 10/03/2019   CO2 27 10/03/2019   GLUCOSE 91 10/03/2019   BUN 15 10/03/2019   CREATININE 1.38 10/03/2019   BILITOT 0.6 10/03/2019   ALKPHOS 100 10/03/2019   AST 25 10/03/2019   ALT 23 10/03/2019   PROT 7.1 10/03/2019   ALBUMIN 4.3 10/03/2019   CALCIUM 9.8 10/03/2019   ANIONGAP 10 01/31/2019   GFR 62.03 10/03/2019   Lab Results  Component Value Date   CHOL 281 (H) 08/03/2018   Lab Results  Component Value Date   HDL 55 08/03/2018   Lab Results  Component Value Date   LDLCALC 199 (H) 08/03/2018   Lab Results  Component Value Date   TRIG 122 08/03/2018   Lab Results  Component Value Date   CHOLHDL 5.1 (H) 08/03/2018   No results found for: HGBA1C    Assessment & Plan:   Problem List Items Addressed This Visit      Cardiovascular and Mediastinum   Essential hypertension   Relevant Medications   amLODipine (NORVASC) 10 MG tablet   atorvastatin (LIPITOR) 20 MG tablet   Other Relevant Orders   Comprehensive metabolic panel   CBC     Digestive   Rectal bleeding   Relevant Orders   CBC   Ambulatory referral to Gastroenterology   Slow transit constipation   Relevant Medications   docusate sodium (COLACE) 100 MG capsule     Genitourinary   Benign prostatic hyperplasia with urinary hesitancy   Relevant Medications   tamsulosin (FLOMAX) 0.4 MG CAPS capsule     Other   Elevated LDL cholesterol level   Relevant Medications   atorvastatin (LIPITOR) 20 MG tablet   Other Relevant Orders   Comprehensive metabolic panel   LDL cholesterol, direct  Alcohol use   Non-compliant patient   B12 deficiency - Primary   Relevant Medications   b complex vitamins capsule   Other Relevant Orders   Vitamin B12      Meds ordered this  encounter  Medications  . amLODipine (NORVASC) 10 MG tablet    Sig: Take 1 tablet (10 mg total) by mouth daily.    Dispense:  90 tablet    Refill:  1  . atorvastatin (LIPITOR) 20 MG tablet    Sig: Take 1 tablet (20 mg total) by mouth daily.    Dispense:  90 tablet    Refill:  3  . tamsulosin (FLOMAX) 0.4 MG CAPS capsule    Sig: Take 2 capsules (0.8 mg total) by mouth daily after supper.    Dispense:  180 capsule    Refill:  3  . docusate sodium (COLACE) 100 MG capsule    Sig: Take 1 capsule (100 mg total) by mouth 2 (two) times daily.    Dispense:  10 capsule    Refill:  0  . b complex vitamins capsule    Sig: Take 1 capsule by mouth daily.    Dispense:  90 capsule    Refill:  1    Follow-up: Return in about 3 months (around 04/29/2020).  My thyroid exam is normal today. Hold off on thyroid ultrasound. Urged him to go ahead and fill the B complex tablet. He will use Colace twice daily for his constipation. Gastroenterology consultation for rectal bleeding.  Libby Maw, MD

## 2020-01-29 LAB — COMPREHENSIVE METABOLIC PANEL
ALT: 21 U/L (ref 0–53)
AST: 21 U/L (ref 0–37)
Albumin: 4.3 g/dL (ref 3.5–5.2)
Alkaline Phosphatase: 95 U/L (ref 39–117)
BUN: 14 mg/dL (ref 6–23)
CO2: 29 mEq/L (ref 19–32)
Calcium: 9.6 mg/dL (ref 8.4–10.5)
Chloride: 104 mEq/L (ref 96–112)
Creatinine, Ser: 1.36 mg/dL (ref 0.40–1.50)
GFR: 63.02 mL/min (ref >60.00)
Glucose, Bld: 105 mg/dL — ABNORMAL HIGH (ref 70–99)
Potassium: 4.1 mEq/L (ref 3.5–5.1)
Sodium: 141 mEq/L (ref 135–145)
Total Bilirubin: 0.5 mg/dL (ref 0.2–1.2)
Total Protein: 7 g/dL (ref 6.0–8.3)

## 2020-01-29 LAB — CBC
HCT: 38.7 % — ABNORMAL LOW (ref 39.0–52.0)
Hemoglobin: 12.9 g/dL — ABNORMAL LOW (ref 13.0–17.0)
MCHC: 33.5 g/dL (ref 30.0–36.0)
MCV: 88 fl (ref 78.0–100.0)
Platelets: 333 10*3/uL (ref 150.0–400.0)
RBC: 4.4 Mil/uL (ref 4.22–5.81)
RDW: 13.7 % (ref 11.5–15.5)
WBC: 4.5 10*3/uL (ref 4.0–10.5)

## 2020-01-29 LAB — LDL CHOLESTEROL, DIRECT: Direct LDL: 93 mg/dL

## 2020-01-29 LAB — VITAMIN B12: Vitamin B-12: 174 pg/mL — ABNORMAL LOW (ref 211–911)

## 2020-01-30 ENCOUNTER — Telehealth: Payer: Self-pay | Admitting: Family Medicine

## 2020-01-30 NOTE — Progress Notes (Signed)
  Chronic Care Management   Note  01/30/2020 Name: Tramel Westbrook MRN: [id number] DOB: 06-09-1951  Thaddius Manes is a 69 y.o. year old male who is a primary care patient of Libby Maw, MD. I reached out to [facility] by phone today in response to a referral sent by Mr. Jakel Rolfe's PCP, Libby Maw, MD.   Mr. Mcauliffe was given information about Chronic Care Management services today including:  1. CCM service includes personalized support from designated clinical staff supervised by his physician, including individualized plan of care and coordination with other care providers 2. 24/7 contact phone numbers for assistance for urgent and routine care needs. 3. Service will only be billed when office clinical staff spend 20 minutes or more in a month to coordinate care. 4. Only one practitioner may furnish and bill the service in a calendar month. 5. The patient may stop CCM services at any time (effective at the end of the month) by phone call to the office staff.   Patient agreed to services and verbal consent obtained.   This note is not being shared with the patient for the following reason: To respect privacy (The patient or proxy has requested that the information not be shared).  Follow up plan:   Earney Hamburg Upstream Scheduler

## 2020-02-06 ENCOUNTER — Telehealth: Payer: Self-pay | Admitting: Family Medicine

## 2020-02-06 ENCOUNTER — Encounter: Payer: Self-pay | Admitting: Gastroenterology

## 2020-02-06 ENCOUNTER — Ambulatory Visit: Payer: Medicare HMO | Admitting: Gastroenterology

## 2020-02-06 VITALS — BP 124/80 | HR 81 | Ht 69.0 in | Wt 212.2 lb

## 2020-02-06 DIAGNOSIS — K5909 Other constipation: Secondary | ICD-10-CM

## 2020-02-06 DIAGNOSIS — K648 Other hemorrhoids: Secondary | ICD-10-CM | POA: Diagnosis not present

## 2020-02-06 NOTE — Telephone Encounter (Signed)
Patient called and wanted to see what kind of B12 supplements he needed to take, pls advise. CB is [phone]

## 2020-02-06 NOTE — Progress Notes (Signed)
[facility] Consult Note:  History: Logan Orr 02/06/2020  Referring provider: Libby Maw, MD  Reason for consult/chief complaint: Rectal Bleeding (bright red blood while having a bowel movement x 2 months) and Constipation (ongoing issues)   Subjective  HPI:  This is a pleasant 69 year old man referred by primary care for constipation and rectal bleeding.  He recalls constipation developing after placement of radioactive seed implants for prostate cancer last July.  He will have a BM every 2 or 3 days, and will need to sit for prolonged periods and strain.  Last week his PCP prescribed 10 tablets of Colace to take once daily, no improvement yet. Logan Orr denies abdominal pain nausea vomiting or weight loss.  He has occasional blood per rectum after bowel movement, but more often is blood with wiping.  This seems to occur more if he has been riding his motorcycle a lot.  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  He believes a couple of polyps were removed and it was otherwise normal.   ROS:  Review of Systems  Constitutional: Negative for appetite change and unexpected weight change.  HENT: Negative for mouth sores and voice change.   Eyes: Negative for pain and redness.  Respiratory: Negative for cough and shortness of breath.   Cardiovascular: Negative for chest pain and palpitations.  Genitourinary: Negative for dysuria and hematuria.  Musculoskeletal: Positive for back pain. Negative for arthralgias and myalgias.  Skin: Negative for pallor and rash.  Neurological: Negative for weakness and headaches.  Hematological: Negative for adenopathy.     Past Medical History: Past Medical History:  Diagnosis Date  . Hemorrhoids   . History of colon polyps   . HLD (hyperlipidemia)   . Hyperplasia of prostate with lower urinary tract symptoms (LUTS)   . Hypertension   . Prostate  cancer [facility]) urologist-- Logan Orr/  oncologist-- Logan Orr   dx 11-23-2018  Stage T1c,  3+4  . Thyroid nodule    08-03-2018  pt pcp note in epic, left side, ultasound ordered   . Wears glasses      Past Surgical History: Past Surgical History:  Procedure Laterality Date  . COLONOSCOPY    . CYSTOSCOPY N/A 02/04/2019   Procedure: CYSTOSCOPY;  Surgeon: Logan Mallow, MD;  Location: [facility];  Service: Urology;  Laterality: N/A;  no seeds seen per Logan Orr  . LACERATION REPAIR  1990s   repair left lower arm complex laceration   . PROSTATE BIOPSY  11-23-2018   at Logan [facility]r office  . RADIOACTIVE SEED IMPLANT N/A 02/04/2019   Procedure: RADIOACTIVE SEED IMPLANT/BRACHYTHERAPY IMPLANT;  Surgeon: Logan Mallow, MD;  Location: [facility];  Service: Urology;  Laterality: N/A;  90 seeds  . SPACE OAR INSTILLATION N/A 02/04/2019   Procedure: SPACE OAR INSTILLATION;  Surgeon: Logan Mallow, MD;  Location: [facility];  Service: Urology;  Laterality: N/A;     Family History: Family History  Adopted: Yes    Social History: Social History   Socioeconomic History  . Marital status: Single    Spouse name: Not on file  . Number of children: 1  . Years of education: Not on file  . Highest education level: Not on file  Occupational History    Comment: unemployed   Tobacco Use  . Smoking status: Never Smoker  . Smokeless tobacco: Never  Used  Vaping Use  . Vaping Use: Never used  Substance and Sexual Activity  . Alcohol use: Yes    Alcohol/week: 21.0 standard drinks    Types: 21 Cans of beer per week    Comment: 3-4 beers daily  . Drug use: Never  . Sexual activity: Yes  Other Topics Concern  . Not on file  Social History Narrative  . Not on file   Social Determinants of Health   Financial Resource Strain:   . Difficulty of Paying Living Expenses:   Food Insecurity:   . Worried About Charity fundraiser in the Last Year:    . Arboriculturist in the Last Year:   Transportation Needs:   . Film/video editor (Medical):   Logan Kitchen Lack of Transportation (Non-Medical):   Physical Activity:   . Days of Exercise per Week:   . Minutes of Exercise per Session:   Stress:   . Feeling of Stress :   Social Connections:   . Frequency of Communication with Friends and Family:   . Frequency of Social Gatherings with Friends and Family:   . Attends Religious Orr:   . Active Member of Clubs or Organizations:   . Attends Archivist Meetings:   Logan Kitchen Marital Status:     Allergies: No Known Allergies  Outpatient Meds: Current Outpatient Medications  Medication Sig Dispense Refill  . amLODipine (NORVASC) 10 MG tablet Take 1 tablet (10 mg total) by mouth daily. 90 tablet 1  . atorvastatin (LIPITOR) 20 MG tablet Take 1 tablet (20 mg total) by mouth daily. 90 tablet 3  . b complex vitamins capsule Take 1 capsule by mouth daily. 90 capsule 1  . docusate sodium (COLACE) 100 MG capsule Take 1 capsule (100 mg total) by mouth 2 (two) times daily. 10 capsule 0  . tamsulosin (FLOMAX) 0.4 MG CAPS capsule Take 2 capsules (0.8 mg total) by mouth daily after supper. 180 capsule 3   No current facility-administered medications for this visit.      ___________________________________________________________________ Objective   Exam:  BP 124/80 (BP Location: Left Arm, Patient Position: Sitting, Cuff Size: Normal)   Pulse 81   Ht 5\' 9"  (1.753 m)   Wt 212 lb 4 oz (96.3 kg)   SpO2 95%   BMI 31.34 kg/m    General: Well-appearing  Eyes: sclera anicteric, no redness  ENT: oral mucosa moist without lesions, no cervical or supraclavicular lymphadenopathy  CV: RRR without murmur, S1/S2, no JVD, no peripheral edema  Resp: clear to auscultation bilaterally, normal RR and effort noted  GI: soft, no tenderness, with active bowel sounds. No guarding or palpable organomegaly noted.  Skin; warm and dry, no rash or  jaundice noted  Neuro: awake, alert and oriented x 3. Normal gross motor function and fluent speech Rectal: Normal external, normal sphincter tone, no fissure or tenderness or palpable internal lesion. Anoscopy: Mildly enlarged, nonprolapsed RP internal hemorrhoid column Labs:  CBC Latest Ref Rng & Units 01/28/2020 10/03/2019 03/14/2019  WBC 4.0 - 10.5 K/uL 4.5 4.3 4.2  Hemoglobin 13.0 - 17.0 g/dL 12.9(L) 13.7 12.4(L)  Hematocrit 39 - 52 % 38.7(L) 41.9 37.0(L)  Platelets 150 - 400 K/uL 333.0 302.0 304.0     Assessment: Encounter Diagnoses  Name Primary?  . Chronic constipation Yes  . Bleeding internal hemorrhoids     Letter your chronic constipation, patient feels that it occurred since his prostate seed placement.  Reports a colonoscopy with just a couple  [place] the last few [place].  Not anemic.  Exam finds mild internal hemorrhoid disease that appears to explain the small-volume bleeding exacerbated by constipation.  Primarily, he needs improvement in his bowel habits and then I suspect the bleeding will be much less of an issue and probably not require intervention such as banding or surgery.  Plan:  Instructions for daily fiber supplement, increase water intake and activity. 1/2-1 full capful of MiraLAX in a glass of water every other day, dose adjust as needed. See me as needed, particularly if bleeding persists adherence to the above treatment plan.  Thank you for the courtesy of this consult.  Please call me with any questions or concerns.  Nelida Meuse III  CC: Referring provider noted above

## 2020-02-06 NOTE — Patient Instructions (Signed)
If you are age 68 or older, your body mass index should be between 23-30. Your Body mass index is 31.34 kg/m. If this is out of the aforementioned range listed, please consider follow up with your Primary Care Provider.  If you are age 61 or younger, your body mass index should be between 19-25. Your Body mass index is 31.34 kg/m. If this is out of the aformentioned range listed, please consider follow up with your Primary Care Provider.   Please start Benefiber daily.  Increase your daily water intake.  Start Miralax 1 capful every other day, adjust dose as needed.  Follow up as needed.   It was a pleasure to see you today!  Dr. Loletha Carrow

## 2020-02-07 NOTE — Telephone Encounter (Signed)
Called patient to inform him that Rx was sent pharmacy no answer left message on VM.

## 2020-02-28 NOTE — Chronic Care Management (AMB) (Deleted)
Chronic Care Management Pharmacy  Name: Logan Orr  MRN: [id number] DOB: [birth year]   Chief Complaint/ HPI  [name],  69 y.o. , male presents for their Initial CCM visit with the clinical pharmacist via telephone due to COVID-19 Pandemic.  PCP : Libby Maw, MD Patient Care Team: Libby Maw, MD as PCP - General (Family Medicine) Cira Rue, RN Nurse Navigator as Registered Nurse (Medical Oncology) Germaine Pomfret, [facility] as Pharmacist (Pharmacist)  Their chronic conditions include: Hypertension, Hyperlipidemia, BPH and chronic constipation   Office Visits:  01/28/20: Patient presented to Dr. Ethelene Hal for follow-up. B12 and Hgb levels low, patient instructed to resume vitamin B12 supplement. Patient with constipation, prescribed docusate 100 mg BID and referred to GI.  10/03/19: Patient presented to Dr. Ethelene Hal for follow-up. Vitamin B levels low, patient started on B12 supplement. Tamsulosin increased to 0.8 mg daily.   Consult Visit: 02/06/20: Patient referred to Dr. Loletha Carrow (GI) for chronic constipation and rectal bleeding. Patient with constipation + bleeding since prostate seed placement. Patient instructed to use daily fiber supplement, increase water intake, and 1/2-1 capful of miralax every other day.  01/10/20: Patient presented to [facility] for viral URI.   No Known Allergies  Medications: Outpatient Encounter Medications as of 03/02/2020  Medication Sig  . amLODipine (NORVASC) 10 MG tablet Take 1 tablet (10 mg total) by mouth daily.  Marland Kitchen atorvastatin (LIPITOR) 20 MG tablet Take 1 tablet (20 mg total) by mouth daily.  Marland Kitchen b complex vitamins capsule Take 1 capsule by mouth daily.  Marland Kitchen docusate sodium (COLACE) 100 MG capsule Take 1 capsule (100 mg total) by mouth 2 (two) times daily.  . tamsulosin (FLOMAX) 0.4 MG CAPS capsule Take 2 capsules (0.8 mg total) by mouth daily after supper.   No facility-administered encounter medications on file as  of 03/02/2020.     Current Diagnosis/Assessment:    Goals Addressed   None    Hypertension   BP goal is:  <130/80  Office blood pressures are  BP Readings from Last 3 Encounters:  02/06/20 124/80  01/28/20 124/84  10/03/19 (!) 142/79   Patient checks BP at home {CHL HP BP Monitoring Frequency:[phone]} Patient home BP readings are ranging: ***  Patient has failed these meds in the past: *** Patient is currently {CHL Controlled/Uncontrolled:[phone]} on the following medications:  . Amlodipine 10 mg daily   We discussed {CHL HP Upstream Pharmacy [phone]}  Plan  Continue {CHL HP Upstream Pharmacy [phone]}   Hyperlipidemia   LDL goal < ***  Lipid Panel     Component Value Date/Time   CHOL 281 (H) 08/03/2018 1419   TRIG 122 08/03/2018 1419   HDL 55 08/03/2018 1419   LDLCALC 199 (H) 08/03/2018 1419   LDLDIRECT 93.0 01/28/2020 1611    Hepatic Function Latest Ref Rng & Units 01/28/2020 10/03/2019 03/14/2019  Total Protein 6.0 - 8.3 g/dL 7.0 7.1 6.7  Albumin 3.5 - 5.2 g/dL 4.3 4.3 4.2  AST 0 - 37 U/L _0 ALT 0 - 53 U/L _1 Alk Phosphatase 39 - 117 U/L 95 100 85  Total Bilirubin 0.2 - 1.2 mg/dL 0.5 0.6 0.5     The 10-year ASCVD risk score Mikey Bussing DC Jr., et al., 2013) is: 18%   Values used to calculate the score:     Age: 21 years     Sex: Male     Is Non-Hispanic African American: Yes     Diabetic:  No     Tobacco smoker: No     Systolic Blood Pressure: 416 mmHg     Is BP treated: Yes     HDL Cholesterol: 55 mg/dL     Total Cholesterol: 281 mg/dL   Patient has failed these meds in past: *** Patient is currently {CHL Controlled/Uncontrolled:7054846890} on the following medications:  . Atorvastatin 20 mg daily  We discussed:  {CHL HP Upstream Pharmacy [phone]}  Plan  Continue {CHL HP Upstream Pharmacy Plans:[phone]}  BPH   PSA  Date Value Ref Range Status  08/03/2018 5.8 (H) < OR = 4.0 ng/mL Final     Comment:    The total PSA value from this assay system is  standardized against the WHO standard. The test  result will be approximately 20% lower when compared  to the equimolar-standardized total PSA (Beckman  Coulter). Comparison of serial PSA results should be  interpreted with this fact in mind. . This test was performed using the Siemens  chemiluminescent method. Values obtained from  different assay methods cannot be used interchangeably. PSA levels, regardless of value, should not be interpreted as absolute evidence of the presence or absence of disease.      Patient has failed these meds in past: *** Patient is currently {CHL Controlled/Uncontrolled:7054846890} on the following medications:  . Tamsulosin 0.4 mg 2 caps nightly   We discussed:  ***  Plan  Continue {CHL HP Upstream Pharmacy Plans:[phone]}  Chronic Constipation   Patient has failed these meds in past: *** Patient is currently {CHL Controlled/Uncontrolled:7054846890} on the following medications:  . ***Docusate 100 mg BID  . Miralax? . Fiber Supplement?   We discussed:  ***  Plan  Continue {CHL HP Upstream Pharmacy Plans:[phone]}   Misc / OTC   Patient has failed these meds in past: *** Patient is currently {CHL Controlled/Uncontrolled:7054846890} on the following medications:  Marland Kitchen Vitamin B Complex daily   We discussed:  ***  Plan  Continue {CHL HP Upstream Pharmacy SAYTK:[id number]}  Vaccines   Reviewed and discussed patient's vaccination history.     There is no immunization history on file for this patient.  Plan  Recommended patient receive *** vaccine in *** office.   Medication Management   [facility] for all medications Uses pill box? {Yes or If no, why not?:20788} Pt endorses ***% compliance  We discussed: ***  Plan  {US Pharmacy TDDU:20254}    Follow up: *** month phone visit  ***

## 2020-03-02 ENCOUNTER — Telehealth: Payer: Medicare HMO

## 2020-03-02 NOTE — Addendum Note (Signed)
Addended by: Lynda Rainwater on: 03/02/2020 09:53 AM   Modules accepted: Orders

## 2020-03-03 ENCOUNTER — Ambulatory Visit: Payer: Medicare HMO

## 2020-03-03 DIAGNOSIS — E7800 Pure hypercholesterolemia, unspecified: Secondary | ICD-10-CM

## 2020-03-03 DIAGNOSIS — I10 Essential (primary) hypertension: Secondary | ICD-10-CM

## 2020-03-03 NOTE — Chronic Care Management (AMB) (Signed)
Chronic Care Management Pharmacy  Name: Logan Orr  MRN: [id number] DOB: 02-04-1951   Chief Complaint/ HPI  [place],  69 y.o. , male presents for their Initial CCM visit with the clinical pharmacist via telephone due to COVID-19 Pandemic.  PCP : Libby Maw, MD Patient Care Team: Libby Maw, MD as PCP - General (Family Medicine) Cira Rue, RN Nurse Navigator as Registered Nurse (Medical Oncology) Germaine Pomfret, [facility] as Pharmacist (Pharmacist)  Their chronic conditions include: Hypertension, Hyperlipidemia, BPH and chronic constipation   Office Visits:  01/28/20: Patient presented to Dr. Ethelene Hal for follow-up. B12 and Hgb levels low, patient instructed to resume vitamin B12 supplement. Patient with constipation, prescribed docusate 100 mg BID and referred to GI.  10/03/19: Patient presented to Dr. Ethelene Hal for follow-up. Vitamin B levels low, patient started on B12 supplement. Tamsulosin increased to 0.8 mg daily.   Consult Visit: 02/06/20: Patient referred to Dr. Loletha Carrow (GI) for chronic constipation and rectal bleeding. Patient with constipation + bleeding since prostate seed placement. Patient instructed to use daily fiber supplement, increase water intake, and 1/2-1 capful of miralax every other day.  01/10/20: Patient presented to [facility] for viral URI.   Patient today is in good spirits and states his health is good overall, other than his ongoing urination issues. 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   No Known Allergies  Medications: Outpatient Encounter Medications as of 03/03/2020  Medication Sig  . amLODipine (NORVASC) 10 MG tablet Take 1 tablet (10 mg total) by mouth daily.  Marland Kitchen atorvastatin (LIPITOR) 20 MG tablet Take 1 tablet (20 mg total) by mouth daily.  Marland Kitchen b complex vitamins capsule Take 1 capsule by mouth daily.  . polyethylene glycol (MIRALAX / GLYCOLAX) 17 g packet Take 17 g by mouth  daily as needed for moderate constipation.  . tamsulosin (FLOMAX) 0.4 MG CAPS capsule Take 2 capsules (0.8 mg total) by mouth daily after supper.  . docusate sodium (COLACE) 100 MG capsule Take 1 capsule (100 mg total) by mouth 2 (two) times daily.   No facility-administered encounter medications on file as of 03/03/2020.     Current Diagnosis/Assessment:  SDOH Interventions     Most Recent Value  SDOH Interventions  Financial Strain Interventions Intervention Not Indicated  Transportation Interventions Intervention Not Indicated      Goals Addressed            This Visit's Progress   . Chronic Care Management       CARE PLAN ENTRY (see longitudinal plan of care for additional care plan information)  Current Barriers:  . Chronic Disease Management support, education, and care coordination needs related to Hypertension, Hyperlipidemia, BPH, and Constipation   Hypertension BP Readings from Last 3 Encounters:  02/06/20 124/80  01/28/20 124/84  10/03/19 (!) 142/79   . Pharmacist Clinical Goal(s): o Over the next 90 days, patient will work with PharmD and providers to maintain BP goal <130/80 . Current regimen:  o Amlodipine 10 mg daily . Patient self care activities - Over the next 90 days, patient will: o Check blood pressure at least once weekly, document, and provide at future appointments o Ensure daily salt intake < 2300 mg/day  Hyperlipidemia Lab Results  Component Value Date/Time   LDLCALC 199 (H) 08/03/2018 02:19 PM   LDLDIRECT 93.0 01/28/2020 04:11 PM   . Pharmacist Clinical Goal(s): o Over the next 90 days, patient will work with PharmD  and providers to maintain LDL goal < 100 . Current regimen:  o Atorvastatin 20 mg daily   Medication management . Pharmacist Clinical Goal(s): o Over the next 90 days, patient will work with PharmD and providers to maintain optimal medication adherence . Current pharmacy: [facility] . Interventions o Comprehensive medication review performed. o Continue current medication management strategy . Patient self care activities - Over the next 30 days, patient will: o Begin taking a daily multivitamin with iron as recommended by Dr. Ethelene Hal o Take medications as prescribed o Report any questions or concerns to PharmD and/or provider(s)      Hypertension   BP goal is:  <130/80  Office blood pressures are  BP Readings from Last 3 Encounters:  02/06/20 124/80  01/28/20 124/84  10/03/19 (!) 142/79   Patient checks BP at [facility] per [facility] at [facility]  Patient home BP readings are ranging: patient unsure states they are "good"  Patient has failed these meds in the past: n/a Patient is currently controlled on the following medications:  . Amlodipine 10 mg daily   We discussed: does not salt food. Denies headaches, chest pain, dizziness.   Plan  Continue current medications   Hyperlipidemia   LDL goal < 100  Lipid Panel     Component Value Date/Time   CHOL 281 (H) 08/03/2018 1419   TRIG 122 08/03/2018 1419   HDL 55 08/03/2018 1419   LDLCALC 199 (H) 08/03/2018 1419   LDLDIRECT 93.0 01/28/2020 1611    Hepatic Function Latest Ref Rng & Units 01/28/2020 10/03/2019 03/14/2019  Total Protein 6.0 - 8.3 g/dL 7.0 7.1 6.7  Albumin 3.5 - 5.2 g/dL 4.3 4.3 4.2  AST 0 - 37 U/L '21 25 28  ' ALT 0 - 53 U/L '21 23 24  ' Alk Phosphatase 39 - 117 U/L 95 100 85  Total Bilirubin 0.2 - 1.2 mg/dL 0.5 0.6 0.5     The 10-year ASCVD risk score Mikey Bussing DC Jr., et al., 2013) is: 18%   Values used to calculate the score:     Age: 11 years     Sex: Male     Is Non-Hispanic African American: Yes     Diabetic: No     Tobacco smoker: No     Systolic Blood Pressure: 106 mmHg     Is BP treated: Yes     HDL Cholesterol: 55 mg/dL     Total Cholesterol: 281 mg/dL   Patient has failed these meds in past: n/a Patient is currently controlled on the following medications:   . Atorvastatin 20 mg daily  We discussed: Has cut back on pork, eating more baked and air fried chicken. Often eats banana or raisin bran cereal with breakfast. Snacks on walnuts throughout the day.   Does not do much formal exercise, but keeps active with work, household chores.   Plan  Continue current medications  BPH / Overactive bladder / Prostate Cancer    Managed by Dr. Gloriann Loan  History of prostate cancer.   PSA  Date Value Ref Range Status  08/03/2018 5.8 (H) < OR = 4.0 ng/mL Final    Comment:    The total PSA value from this assay system is  standardized against the WHO standard. The test  result will be approximately 20% lower when compared  to the equimolar-standardized total PSA (Beckman  Coulter). Comparison of serial PSA results should be  interpreted with this fact in mind. . This test was  performed using the Siemens  chemiluminescent method. Values obtained from  different assay methods cannot be used interchangeably. PSA levels, regardless of value, should not be interpreted as absolute evidence of the presence or absence of disease.     Patient has failed these meds in past: n/a Patient is currently uncontrolled on the following medications:  . Tamsulosin 0.4 mg 2 caps nightly   We discussed:  Nocturia (wakes up at least 3-4 times nightly). Some hesitancy.   Plan  Recommend Myrbetriq 25 mg daily for overactive bladder symptoms   Chronic Constipation   Patient has failed these meds in past: n/a Patient is currently controlled on the following medications:  Marland Kitchen Miralax 17 gaily   We discussed:  Drinks 4-5 bottles of water daily. Does not use a fiber supplement but does eat raisin bran cereal.   Plan  Continue current medications  Anemia / Vitamin B12 deficiency    Hemoglobin  Date Value Ref Range Status  01/28/2020 12.9 (L) 13.0 - 17.0 g/dL Final  10/03/2019 13.7 13.0 - 17.0 g/dL Final  03/14/2019 12.4 (L) 13.0 - 17.0 g/dL Final   Vitamin  B-12  Date Value Ref Range Status  01/28/2020 174 (L) 211 - 911 pg/mL Final  10/03/2019 160 (L) 211 - 911 pg/mL Final   Patient has failed these meds in past: n/a Patient is currently uncontrolled on the following medications:  Marland Kitchen Vitamin B Complex daily   We discussed:  Patient started Vitamin B complex as recommended, but has not started multivitamin with iron.   Plan  Recommend starting multivitamin with iron daily supplement.  Vaccines   Reviewed and discussed patient's vaccination history.     There is no immunization history on file for this patient.  Medication Management   [facility] for all medications.  Uses pill box? Yes Pt endorses 100% compliance  Plan  Continue current medication management strategy  Follow up: 6 month phone visit  [place] at [facility]  [phone]

## 2020-03-03 NOTE — Patient Instructions (Addendum)
Visit Information It was great speaking with you today!  Please let me know if you have any questions about our visit.  Goals Addressed            This Visit's Progress   . Chronic Care Management       CARE PLAN ENTRY (see longitudinal plan of care for additional care plan information)  Current Barriers:  . Chronic Disease Management support, education, and care coordination needs related to Hypertension, Hyperlipidemia, BPH, and Constipation   Hypertension BP Readings from Last 3 Encounters:  02/06/20 124/80  01/28/20 124/84  10/03/19 (!) 142/79   . Pharmacist Clinical Goal(s): o Over the next 90 days, patient will work with Logan Orr and providers to maintain BP goal <130/80 . Current regimen:  o Amlodipine 10 mg daily . Patient self care activities - Over the next 90 days, patient will: o Check blood pressure at least once weekly, document, and provide at future appointments o Ensure daily salt intake < 2300 mg/day  Hyperlipidemia Lab Results  Component Value Date/Time   LDLCALC 199 (H) 08/03/2018 02:19 PM   LDLDIRECT 93.0 01/28/2020 04:11 PM   . Pharmacist Clinical Goal(s): o Over the next 90 days, patient will work with Logan Orr and providers to maintain LDL goal < 100 . Current regimen:  o Atorvastatin 20 mg daily   Medication management . Pharmacist Clinical Goal(s): o Over the next 90 days, patient will work with Logan Orr and providers to maintain optimal medication adherence . Current pharmacy: [place] . Interventions o Comprehensive medication review performed. o Continue current medication management strategy . Patient self care activities - Over the next 30 days, patient will: o Begin taking a daily multivitamin with iron as recommended by Dr. Ethelene Orr o Take medications as prescribed o Report any questions or concerns to Logan Orr and/or provider(s)       Logan Orr was given information about Chronic Care Management services today  including:  1. CCM service includes personalized support from designated clinical staff supervised by his physician, including individualized plan of care and coordination with other care providers 2. 24/7 contact phone numbers for assistance for urgent and routine care needs. 3. Standard insurance, coinsurance, copays and deductibles apply for chronic care management only during months in which we provide at least 20 minutes of these services. Most insurances cover these services at 100%, however patients may be responsible for any copay, coinsurance and/or deductible if applicable. This service may help you avoid the need for more expensive face-to-face services. 4. Only one practitioner may furnish and bill the service in a calendar month. 5. The patient may stop CCM services at any time (effective at the end of the month) by phone call to the office staff.  Patient agreed to services and verbal consent obtained.   The patient verbalized understanding of instructions provided today and declined a print copy of patient instruction materials.  Telephone follow up appointment with pharmacy team member scheduled for: 09/04/19  [place] at [facility]  [phone]  Constipation, Adult Constipation is when a person:  Poops (has a bowel movement) fewer times in a week than normal.  Has a hard time pooping.  Has poop that is dry, hard, or bigger than normal. Follow these instructions at home: Eating and drinking   Eat foods that have a lot of fiber, such as: ? Fresh fruits and vegetables. ? Whole grains. ? Beans.  Eat less of foods that are high in fat,  low in fiber, or overly processed, such as: ? [place] fries. ? Hamburgers. ? Cookies. ? Candy. ? Soda.  Drink enough fluid to keep your pee (urine) clear or pale yellow. General instructions  Exercise regularly or as told by your doctor.  Go to the restroom when you feel like you need  to poop. Do not hold it in.  Take over-the-counter and prescription medicines only as told by your doctor. These include any fiber supplements.  Do pelvic floor retraining exercises, such as: ? Doing deep breathing while relaxing your lower belly (abdomen). ? Relaxing your pelvic floor while pooping.  Watch your condition for any changes.  Keep all follow-up visits as told by your doctor. This is important. Contact a doctor if:  You have pain that gets worse.  You have a fever.  You have not pooped for 4 days.  You throw up (vomit).  You are not hungry.  You lose weight.  You are bleeding from the anus.  You have thin, pencil-like poop (stool). Get help right away if:  You have a fever, and your symptoms suddenly get worse.  You leak poop or have blood in your poop.  Your belly feels hard or bigger than normal (is bloated).  You have very bad belly pain.  You feel dizzy or you faint. This information is not intended to replace advice given to you by your health care provider. Make sure you discuss any questions you have with your health care provider. Document Revised: 07/21/2017 Document Reviewed: 01/27/2016 Elsevier Patient Education  2020 [employer].

## 2020-03-06 ENCOUNTER — Telehealth: Payer: Self-pay | Admitting: Family Medicine

## 2020-03-06 NOTE — Telephone Encounter (Signed)
Left message for patient to schedule Annual Wellness Visit.  Please schedule with Nurse Health Advisor Martha Stanley, RN at [facility]  °

## 2020-03-12 ENCOUNTER — Ambulatory Visit: Payer: Medicare HMO

## 2020-03-12 NOTE — Progress Notes (Deleted)
Subjective:   Logan Orr is a 69 y.o. male who presents for Medicare Annual/Subsequent preventive examination.  Review of Systems           Objective:    There were no vitals filed for this visit. There is no height or weight on file to calculate BMI.  Advanced Directives 03/05/2019 02/04/2019  Does Patient Have a Medical Advance Directive? No No  Would patient like information on creating a medical advance directive? No - Patient declined No - Patient declined    Current Medications (verified) Outpatient Encounter Medications as of 03/12/2020  Medication Sig  . amLODipine (NORVASC) 10 MG tablet Take 1 tablet (10 mg total) by mouth daily.  Marland Kitchen atorvastatin (LIPITOR) 20 MG tablet Take 1 tablet (20 mg total) by mouth daily.  Marland Kitchen b complex vitamins capsule Take 1 capsule by mouth daily.  Marland Kitchen docusate sodium (COLACE) 100 MG capsule Take 1 capsule (100 mg total) by mouth 2 (two) times daily.  . polyethylene glycol (MIRALAX / GLYCOLAX) 17 g packet Take 17 g by mouth daily as needed for moderate constipation.  . tamsulosin (FLOMAX) 0.4 MG CAPS capsule Take 2 capsules (0.8 mg total) by mouth daily after supper.   No facility-administered encounter medications on file as of 03/12/2020.    Allergies (verified) Patient has no known allergies.   History: Past Medical History:  Diagnosis Date  . Hemorrhoids   . History of colon polyps   . HLD (hyperlipidemia)   . Hyperplasia of prostate with lower urinary tract symptoms (LUTS)   . Hypertension   . Prostate cancer [facility]) urologist-- dr bell/  oncologist-- dr Tammi Klippel   dx 11-23-2018  Stage T1c,  3+4  . Thyroid nodule    08-03-2018  pt pcp note in epic, left side, ultasound ordered   . Wears glasses    Past Surgical History:  Procedure Laterality Date  . COLONOSCOPY    . CYSTOSCOPY N/A 02/04/2019   Procedure: CYSTOSCOPY;  Surgeon: Lucas Mallow, MD;  Location: [facility];  Service: Urology;  Laterality: N/A;  no  seeds seen per Dr Gloriann Loan  . LACERATION REPAIR  1990s   repair left lower arm complex laceration   . PROSTATE BIOPSY  11-23-2018   at dr Gloriann Loan office  . RADIOACTIVE SEED IMPLANT N/A 02/04/2019   Procedure: RADIOACTIVE SEED IMPLANT/BRACHYTHERAPY IMPLANT;  Surgeon: Lucas Mallow, MD;  Location: [facility];  Service: Urology;  Laterality: N/A;  90 seeds  . SPACE OAR INSTILLATION N/A 02/04/2019   Procedure: SPACE OAR INSTILLATION;  Surgeon: Lucas Mallow, MD;  Location: [facility];  Service: Urology;  Laterality: N/A;   Family History  Adopted: Yes   Social History   Socioeconomic History  . Marital status: Single    Spouse name: Not on file  . Number of children: 1  . Years of education: Not on file  . Highest education level: Not on file  Occupational History    Comment: unemployed   Tobacco Use  . Smoking status: Never Smoker  . Smokeless tobacco: Never Used  Vaping Use  . Vaping Use: Never used  Substance and Sexual Activity  . Alcohol use: Yes    Alcohol/week: 21.0 standard drinks    Types: 21 Cans of beer per week    Comment: 3-4 beers daily  . Drug use: Never  . Sexual activity: Yes  Other Topics Concern  . Not on file  Social History Narrative  .  Not on file   Social Determinants of Health   Financial Resource Strain: Low Risk   . Difficulty of Paying Living Expenses: Not hard at all  Food Insecurity:   . Worried About Charity fundraiser in the Last Year:   . Arboriculturist in the Last Year:   Transportation Needs: No Transportation Needs  . Lack of Transportation (Medical): No  . Lack of Transportation (Non-Medical): No  Physical Activity:   . Days of Exercise per Week:   . Minutes of Exercise per Session:   Stress:   . Feeling of Stress :   Social Connections:   . Frequency of Communication with Friends and Family:   . Frequency of Social Gatherings with Friends and Family:   . Attends Religious Services:   . Active  Member of Clubs or Organizations:   . Attends Archivist Meetings:   Marland Kitchen Marital Status:     Tobacco Counseling Counseling given: Not Answered   Clinical Intake:                 Diabetic?No         Activities of Daily Living No flowsheet data found.  Patient Care Team: Libby Maw, MD as PCP - General (Family Medicine) Cira Rue, RN Nurse Navigator as Registered Nurse ([place]) Germaine Pomfret, [facility] as Pharmacist (Pharmacist)  Indicate any recent Medical Services you may have received from other than [facility] providers in the past year (date may be approximate).     Assessment:   This is a routine wellness examination for [place].  Hearing/Vision screen No exam data present  Dietary issues and exercise activities discussed:    Goals    . Chronic Care Management     CARE PLAN ENTRY (see longitudinal plan of care for additional care plan information)  Current Barriers:  . Chronic Disease Management support, education, and care coordination needs related to Hypertension, Hyperlipidemia, BPH, and Constipation   Hypertension BP Readings from Last 3 Encounters:  02/06/20 124/80  01/28/20 124/84  10/03/19 (!) 142/79   . Pharmacist Clinical Goal(s): o Over the next 90 days, patient will work with PharmD and providers to maintain BP goal <130/80 . Current regimen:  o Amlodipine 10 mg daily . Patient self care activities - Over the next 90 days, patient will: o Check blood pressure at least once weekly, document, and provide at future appointments o Ensure daily salt intake < 2300 mg/day  Hyperlipidemia Lab Results  Component Value Date/Time   LDLCALC 199 (H) 08/03/2018 02:19 PM   LDLDIRECT 93.0 01/28/2020 04:11 PM   . Pharmacist Clinical Goal(s): o Over the next 90 days, patient will work with PharmD and providers to maintain LDL goal < 100 . Current regimen:  o Atorvastatin 20 mg daily   Medication  management . Pharmacist Clinical Goal(s): o Over the next 90 days, patient will work with PharmD and providers to maintain optimal medication adherence . Current pharmacy: [facility] . Interventions o Comprehensive medication review performed. o Continue current medication management strategy . Patient self care activities - Over the next 30 days, patient will: o Begin taking a daily multivitamin with iron as recommended by Dr. Ethelene Hal o Take medications as prescribed o Report any questions or concerns to PharmD and/or provider(s)      Depression Screen PHQ 2/9 Scores 10/03/2019  PHQ - 2 Score 0    Fall Risk Fall Risk  10/03/2019  Falls in the  past year? 0    Any stairs in or around the home? {YES/NO:21197} If so, are there any without handrails? {YES/NO:21197} Home free of loose throw rugs in walkways, pet beds, electrical cords, etc? {YES/NO:21197} Adequate lighting in your home to reduce risk of falls? {YES/NO:21197}  ASSISTIVE DEVICES UTILIZED TO PREVENT FALLS:  Life alert? {YES/NO:21197} Use of a cane, walker or w/c? {YES/NO:21197} Grab bars in the bathroom? {YES/NO:21197} Shower chair or bench in shower? {YES/NO:21197} Elevated toilet seat or a handicapped toilet? {YES/NO:21197}  TIMED UP AND GO:  Was the test performed? {YES/NO:21197}.  Length of time to ambulate 10 feet: *** sec.   {Appearance of WEXH:3716967}  Cognitive Function:        Immunizations  There is no immunization history on file for this patient.  TDAP status: Due, Education has been provided regarding the importance of this vaccine. Advised may receive this vaccine at local pharmacy or Health Dept. Aware to provide a copy of the vaccination record if obtained from local pharmacy or Health Dept. Verbalized acceptance and understanding.   Flu Vaccine Status: Due 04/2020  {Pneumococcal vaccine status:2101807}   {Covid-19 vaccine status:2101808}  Qualifies for Shingles  Vaccine? No   Zostavax completed No   Shingrix Completed?: No.    Education has been provided regarding the importance of this vaccine. Patient has been advised to call insurance company to determine out of pocket expense if they have not yet received this vaccine. Advised may also receive vaccine at local pharmacy or Health Dept. Verbalized acceptance and understanding.  Screening Tests Health Maintenance  Topic Date Due  . Hepatitis C Screening  Never done  . COVID-19 Vaccine (1) Never done  . TETANUS/TDAP  Never done  . PNA vac Low Risk Adult (1 of 2 - PCV13) Never done  . INFLUENZA VACCINE  03/22/2020  . COLONOSCOPY  03/22/2022    Health Maintenance  Health Maintenance Due  Topic Date Due  . Hepatitis C Screening  Never done  . COVID-19 Vaccine (1) Never done  . TETANUS/TDAP  Never done  . PNA vac Low Risk Adult (1 of 2 - PCV13) Never done    Colorectal cancer screening: Completed 03/22/2017. Repeat every 5 years  Lung Cancer Screening: (Low Dose CT Chest recommended if Age 78-80 years, 30 pack-year currently smoking OR have quit w/in 15years.) does not qualify.     Additional Screening:  Hepatitis C Screening: does qualify; Discuss with PCP  Vision Screening: Recommended annual ophthalmology exams for early detection of glaucoma and other disorders of the eye. Is the patient up to date with their annual eye exam?  {YES/NO:21197} Who is the provider or what is the name of the office in which the patient attends annual eye exams? *** If pt is not established with a provider, would they like to be referred to a provider to establish care? {YES/NO:21197}.   Dental Screening: Recommended annual dental exams for proper oral hygiene  Community Resource Referral / Chronic Care Management: CRR required this visit?  {YES/NO:21197}  CCM required this visit?  {YES/NO:21197}     Plan:     I have personally reviewed and noted the following in the patient's chart:    . Medical and social history . Use of alcohol, tobacco or illicit drugs  . Current medications and supplements . Functional ability and status . Nutritional status . Physical activity . Advanced directives . List of other physicians . Hospitalizations, surgeries, and ER visits in previous 12 months . Vitals .  Screenings to include cognitive, depression, and falls . Referrals and appointments  In addition, I have reviewed and discussed with patient certain preventive protocols, quality metrics, and best practice recommendations. A written personalized care plan for preventive services as well as general preventive health recommendations were provided to patient.     Marta Antu, LPN   0/80/2233  Nurse Health Advisor  Nurse Notes: ***

## 2020-03-19 ENCOUNTER — Ambulatory Visit (INDEPENDENT_AMBULATORY_CARE_PROVIDER_SITE_OTHER): Payer: Medicare HMO

## 2020-03-19 VITALS — Ht 69.0 in | Wt 213.0 lb

## 2020-03-19 DIAGNOSIS — Z0000 Encounter for general adult medical examination without abnormal findings: Secondary | ICD-10-CM | POA: Diagnosis not present

## 2020-03-19 NOTE — Patient Instructions (Signed)
Logan Orr , Thank you for taking time to come for your Medicare Wellness Visit. I appreciate your ongoing commitment to your health goals. Please review the following plan we discussed and let me know if I can assist you in the future.   Screening recommendations/referrals: Colonoscopy: Completed 03/22/2017-Due 03/22/2022 Recommended yearly ophthalmology/optometry visit for glaucoma screening and checkup Recommended yearly dental visit for hygiene and checkup  Vaccinations: Influenza vaccine: Due 04/2020 Pneumococcal vaccine: Due-Discuss with PCP at next office visit Tdap vaccine: Discuss with pharmacy Shingles vaccine: Discuss with pharmacy   Covid-19: Declined vaccines  Advanced directives: Discussed. Declined information at this time.  Conditions/risks identified: See problem list Referral placed to our [facility] Care Team regarding assistance with food & utilities as we discussed during today's visit. Someone will be calling you to discuss.  Next appointment: Follow up in one year for your annual wellness visit.   Preventive Care 66 Years and Older, Male Preventive care refers to lifestyle choices and visits with your health care provider that can promote health and wellness. What does preventive care include?  A yearly physical exam. This is also called an annual well check.  Dental exams once or twice a year.  Routine eye exams. Ask your health care provider how often you should have your eyes checked.  Personal lifestyle choices, including:  Daily care of your teeth and gums.  Regular physical activity.  Eating a healthy diet.  Avoiding tobacco and drug use.  Limiting alcohol use.  Practicing safe sex.  Taking low doses of aspirin every day.  Taking vitamin and mineral supplements as recommended by your health care provider. What happens during an annual well check? The services and screenings done by your health care provider during your annual well check will depend  on your age, overall health, lifestyle risk factors, and family history of disease. Counseling  Your health care provider may ask you questions about your:  Alcohol use.  Tobacco use.  Drug use.  Emotional well-being.  Home and relationship well-being.  Sexual activity.  Eating habits.  History of falls.  Memory and ability to understand (cognition).  Work and work Statistician. Screening  You may have the following tests or measurements:  Height, weight, and BMI.  Blood pressure.  Lipid and cholesterol levels. These may be checked every 5 years, or more frequently if you are over 44 years old.  Skin check.  Lung cancer screening. You may have this screening every year starting at age 36 if you have a 30-pack-year history of smoking and currently smoke or have quit within the past 15 years.  Fecal occult blood test (FOBT) of the stool. You may have this test every year starting at age 63.  Flexible sigmoidoscopy or colonoscopy. You may have a sigmoidoscopy every 5 years or a colonoscopy every 10 years starting at age 83.  Prostate cancer screening. Recommendations will vary depending on your family history and other risks.  Hepatitis C blood test.  Hepatitis B blood test.  Sexually transmitted disease (STD) testing.  Diabetes screening. This is done by checking your blood sugar (glucose) after you have not eaten for a while (fasting). You may have this done every 1-3 years.  Abdominal aortic aneurysm (AAA) screening. You may need this if you are a current or former smoker.  Osteoporosis. You may be screened starting at age 22 if you are at high risk. Talk with your health care provider about your test results, treatment options, and if necessary, the need  for more tests. Vaccines  Your health care provider may recommend certain vaccines, such as:  Influenza vaccine. This is recommended every year.  Tetanus, diphtheria, and acellular pertussis (Tdap, Td)  vaccine. You may need a Td booster every 10 years.  Zoster vaccine. You may need this after age 62.  Pneumococcal 13-valent conjugate (PCV13) vaccine. One dose is recommended after age 32.  Pneumococcal polysaccharide (PPSV23) vaccine. One dose is recommended after age 67. Talk to your health care provider about which screenings and vaccines you need and how often you need them. This information is not intended to replace advice given to you by your health care provider. Make sure you discuss any questions you have with your health care provider. Document Released: 09/04/2015 Document Revised: 04/27/2016 Document Reviewed: 06/09/2015 Elsevier Interactive Patient Education  2017 [place] Prevention in the Home Falls can cause injuries. They can happen to people of all ages. There are many things you can do to make your home safe and to help prevent falls. What can I do on the outside of my home?  Regularly fix the edges of walkways and driveways and fix any cracks.  Remove anything that might make you trip as you walk through a door, such as a raised step or threshold.  Trim any bushes or trees on the path to your home.  Use bright outdoor lighting.  Clear any walking paths of anything that might make someone trip, such as rocks or tools.  Regularly check to see if handrails are loose or broken. Make sure that both sides of any steps have handrails.  Any raised decks and porches should have guardrails on the edges.  Have any leaves, snow, or ice cleared regularly.  Use sand or salt on walking paths during winter.  Clean up any spills in your garage right away. This includes oil or grease spills. What can I do in the bathroom?  Use night lights.  Install grab bars by the toilet and in the tub and shower. Do not use towel bars as grab bars.  Use non-skid mats or decals in the tub or shower.  If you need to sit down in the shower, use a plastic, non-slip  stool.  Keep the floor dry. Clean up any water that spills on the floor as soon as it happens.  Remove soap buildup in the tub or shower regularly.  Attach bath mats securely with double-sided non-slip rug tape.  Do not have throw rugs and other things on the floor that can make you trip. What can I do in the bedroom?  Use night lights.  Make sure that you have a light by your bed that is easy to reach.  Do not use any sheets or blankets that are too big for your bed. They should not hang down onto the floor.  Have a firm chair that has side arms. You can use this for support while you get dressed.  Do not have throw rugs and other things on the floor that can make you trip. What can I do in the kitchen?  Clean up any spills right away.  Avoid walking on wet floors.  Keep items that you use a lot in easy-to-reach places.  If you need to reach something above you, use a strong step stool that has a grab bar.  Keep electrical cords out of the way.  Do not use floor polish or wax that makes floors slippery. If you must use wax, use  non-skid floor wax.  Do not have throw rugs and other things on the floor that can make you trip. What can I do with my stairs?  Do not leave any items on the stairs.  Make sure that there are handrails on both sides of the stairs and use them. Fix handrails that are broken or loose. Make sure that handrails are as long as the stairways.  Check any carpeting to make sure that it is firmly attached to the stairs. Fix any carpet that is loose or worn.  Avoid having throw rugs at the top or bottom of the stairs. If you do have throw rugs, attach them to the floor with carpet tape.  Make sure that you have a light switch at the top of the stairs and the bottom of the stairs. If you do not have them, ask someone to add them for you. What else can I do to help prevent falls?  Wear shoes that:  Do not have high heels.  Have rubber bottoms.  Are  comfortable and fit you well.  Are closed at the toe. Do not wear sandals.  If you use a stepladder:  Make sure that it is fully opened. Do not climb a closed stepladder.  Make sure that both sides of the stepladder are locked into place.  Ask someone to hold it for you, if possible.  Clearly mark and make sure that you can see:  Any grab bars or handrails.  First and last steps.  Where the edge of each step is.  Use tools that help you move around (mobility aids) if they are needed. These include:  Canes.  Walkers.  Scooters.  Crutches.  Turn on the lights when you go into a dark area. Replace any light bulbs as soon as they burn out.  Set up your furniture so you have a clear path. Avoid moving your furniture around.  If any of your floors are uneven, fix them.  If there are any pets around you, be aware of where they are.  Review your medicines with your doctor. Some medicines can make you feel dizzy. This can increase your chance of falling. Ask your doctor what other things that you can do to help prevent falls. This information is not intended to replace advice given to you by your health care provider. Make sure you discuss any questions you have with your health care provider. Document Released: 06/04/2009 Document Revised: 01/14/2016 Document Reviewed: 09/12/2014 Elsevier Interactive Patient Education  2017 [employer].

## 2020-03-19 NOTE — Progress Notes (Signed)
Subjective:   Logan Orr is a 69 y.o. male who presents for an Initial Medicare Annual Wellness Visit.  I connected with Raylen today by telephone and verified that I am speaking with the correct person using two identifiers. Location patient: home Location provider: work Persons participating in the virtual visit: patient, Marine scientist.    I discussed the limitations, risks, security and privacy concerns of performing an evaluation and management service by telephone and the availability of in person appointments. I also discussed with the patient that there may be a patient responsible charge related to this service. The patient expressed understanding and verbally consented to this telephonic visit.    Interactive audio and video telecommunications were attempted between this provider and patient, however failed, due to patient having technical difficulties OR patient did not have access to video capability.  We continued and completed visit with audio only.  Some vital signs may be absent or patient reported.   Time Spent with patient on telephone encounter: 20 minutes  Review of Systems     Cardiac Risk Factors include: advanced age (>72men, >48 women);dyslipidemia;male gender;obesity (BMI >30kg/m2);sedentary lifestyle     Objective:    Today's Vitals   03/19/20 1502  Weight: (!) 213 lb (96.6 kg)  Height: 5\' 9"  (1.753 m)   Body mass index is 31.45 kg/m.  Advanced Directives 03/19/2020 03/05/2019 02/04/2019  Does Patient Have a Medical Advance Directive? No No No  Would patient like information on creating a medical advance directive? No - Patient declined No - Patient declined No - Patient declined    Current Medications (verified) Outpatient Encounter Medications as of 03/19/2020  Medication Sig  . amLODipine (NORVASC) 10 MG tablet Take 1 tablet (10 mg total) by mouth daily.  Marland Kitchen atorvastatin (LIPITOR) 20 MG tablet Take 1 tablet (20 mg total) by mouth daily.  Marland Kitchen b complex  vitamins capsule Take 1 capsule by mouth daily.  Marland Kitchen docusate sodium (COLACE) 100 MG capsule Take 1 capsule (100 mg total) by mouth 2 (two) times daily.  . polyethylene glycol (MIRALAX / GLYCOLAX) 17 g packet Take 17 g by mouth daily as needed for moderate constipation.  . tamsulosin (FLOMAX) 0.4 MG CAPS capsule Take 2 capsules (0.8 mg total) by mouth daily after supper.   No facility-administered encounter medications on file as of 03/19/2020.    Allergies (verified) Patient has no known allergies.   History: Past Medical History:  Diagnosis Date  . Hemorrhoids   . History of colon polyps   . HLD (hyperlipidemia)   . Hyperplasia of prostate with lower urinary tract symptoms (LUTS)   . Hypertension   . Prostate cancer [facility]) urologist-- dr bell/  oncologist-- dr Tammi Klippel   dx 11-23-2018  Stage T1c,  3+4  . Thyroid nodule    08-03-2018  pt pcp note in epic, left side, ultasound ordered   . Wears glasses    Past Surgical History:  Procedure Laterality Date  . COLONOSCOPY    . CYSTOSCOPY N/A 02/04/2019   Procedure: CYSTOSCOPY;  Surgeon: Lucas Mallow, MD;  Location: [facility];  Service: Urology;  Laterality: N/A;  no seeds seen per Dr Gloriann Loan  . LACERATION REPAIR  1990s   repair left lower arm complex laceration   . PROSTATE BIOPSY  11-23-2018   at dr Gloriann Loan office  . RADIOACTIVE SEED IMPLANT N/A 02/04/2019   Procedure: RADIOACTIVE SEED IMPLANT/BRACHYTHERAPY IMPLANT;  Surgeon: Lucas Mallow, MD;  Location: [facility];  Service: Urology;  Laterality: N/A;  90 seeds  . SPACE OAR INSTILLATION N/A 02/04/2019   Procedure: SPACE OAR INSTILLATION;  Surgeon: Lucas Mallow, MD;  Location: [facility];  Service: Urology;  Laterality: N/A;   Family History  Adopted: Yes   Social History   Socioeconomic History  . Marital status: Single    Spouse name: Not on file  . Number of children: 1  . Years of education: Not on file  . Highest  education level: Not on file  Occupational History    Comment: unemployed   Tobacco Use  . Smoking status: Never Smoker  . Smokeless tobacco: Never Used  Vaping Use  . Vaping Use: Never used  Substance and Sexual Activity  . Alcohol use: Yes    Alcohol/week: 21.0 standard drinks    Types: 21 Cans of beer per week    Comment: 3-4 beers daily  . Drug use: Never  . Sexual activity: Yes  Other Topics Concern  . Not on file  Social History Narrative  . Not on file   Social Determinants of Health   Financial Resource Strain: Medium Risk  . Difficulty of Paying Living Expenses: Somewhat hard  Food Insecurity: Food Insecurity Present  . Worried About Charity fundraiser in the Last Year: Sometimes true  . Ran Out of Food in the Last Year: Sometimes true  Transportation Needs: No Transportation Needs  . Lack of Transportation (Medical): No  . Lack of Transportation (Non-Medical): No  Physical Activity:   . Days of Exercise per Week:   . Minutes of Exercise per Session:   Stress: No Stress Concern Present  . Feeling of Stress : Not at all  Social Connections: Socially Isolated  . Frequency of Communication with Friends and Family: More than three times a week  . Frequency of Social Gatherings with Friends and Family: More than three times a week  . Attends Religious Services: Never  . Active Member of Clubs or Organizations: No  . Attends Archivist Meetings: Never  . Marital Status: Never married    Tobacco Counseling Counseling given: Not Answered   Clinical Intake:  Pre-visit preparation completed: Yes  Pain : No/denies pain     Nutritional Status: BMI > 30  Obese Nutritional Risks: None Diabetes: No  How often do you need to have someone help you when you read instructions, pamphlets, or other written materials from your doctor or pharmacy?: 1 - Never What is the last grade level you completed in school?: 10th grade  Diabetic?No  Interpreter  Needed?: No  Information entered by :: Caroleen Hamman LPN   Activities of Daily Living In your present state of health, do you have any difficulty performing the following activities: 03/19/2020  Hearing? N  Vision? N  Difficulty concentrating or making decisions? N  Walking or climbing stairs? N  Dressing or bathing? N  Doing errands, shopping? N  Preparing Food and eating ? N  Using the Toilet? N  In the past six months, have you accidently leaked urine? N  Do you have problems with loss of bowel control? N  Managing your Medications? N  Managing your Finances? N  Housekeeping or managing your Housekeeping? N  Some recent data might be hidden    Patient Care Team: Libby Maw, MD as PCP - General (Family Medicine) Cira Rue, RN Nurse Navigator as Registered Nurse ([place]) Germaine Pomfret, [facility] as Pharmacist (Pharmacist)  Indicate any  recent Medical Services you may have received from other than [facility] providers in the past year (date may be approximate).     Assessment:   This is a routine wellness examination for Benaiah.  Hearing/Vision screen  Hearing Screening   125Hz  250Hz  500Hz  1000Hz  2000Hz  3000Hz  4000Hz  6000Hz  8000Hz   Right ear:           Left ear:           Comments: Mild hearing loss  Vision Screening Comments: Cataracts removed 2020 Last eye exam 2020   Dietary issues and exercise activities discussed: Current Exercise Habits: The patient does not participate in regular exercise at present, Exercise limited by: None identified  Goals    . Chronic Care Management     CARE PLAN ENTRY (see longitudinal plan of care for additional care plan information)  Current Barriers:  . Chronic Disease Management support, education, and care coordination needs related to Hypertension, Hyperlipidemia, BPH, and Constipation   Hypertension BP Readings from Last 3 Encounters:  02/06/20 124/80  01/28/20 124/84  10/03/19 (!) 142/79    . Pharmacist Clinical Goal(s): o Over the next 90 days, patient will work with PharmD and providers to maintain BP goal <130/80 . Current regimen:  o Amlodipine 10 mg daily . Patient self care activities - Over the next 90 days, patient will: o Check blood pressure at least once weekly, document, and provide at future appointments o Ensure daily salt intake < 2300 mg/day  Hyperlipidemia Lab Results  Component Value Date/Time   LDLCALC 199 (H) 08/03/2018 02:19 PM   LDLDIRECT 93.0 01/28/2020 04:11 PM   . Pharmacist Clinical Goal(s): o Over the next 90 days, patient will work with PharmD and providers to maintain LDL goal < 100 . Current regimen:  o Atorvastatin 20 mg daily   Medication management . Pharmacist Clinical Goal(s): o Over the next 90 days, patient will work with PharmD and providers to maintain optimal medication adherence . Current pharmacy: [facility] . Interventions o Comprehensive medication review performed. o Continue current medication management strategy . Patient self care activities - Over the next 30 days, patient will: o Begin taking a daily multivitamin with iron as recommended by Dr. Ethelene Hal o Take medications as prescribed o Report any questions or concerns to PharmD and/or provider(s)    . Patient Stated     Drink more water      Depression Screen PHQ 2/9 Scores 03/19/2020 10/03/2019  PHQ - 2 Score 0 0    Fall Risk Fall Risk  03/19/2020 10/03/2019  Falls in the past year? 0 0  Number falls in past yr: 0 -  Injury with Fall? 0 -  Follow up Falls prevention discussed -    Any stairs in or around the home? Yes  If so, are there any without handrails? No  Home free of loose throw rugs in walkways, pet beds, electrical cords, etc? Yes  Adequate lighting in your home to reduce risk of falls? Yes   ASSISTIVE DEVICES UTILIZED TO PREVENT FALLS:  Life alert? No  Use of a cane, walker or w/c? No  Grab bars in the bathroom? No   Shower chair or bench in shower? No  Elevated toilet seat or a handicapped toilet? No   TIMED UP AND GO:  Was the test performed? No phone visit.    Cognitive Function: No cognitive impairment noted.        Immunizations  There is no immunization history on file  for this patient.  TDAP status: Due, Education has been provided regarding the importance of this vaccine. Advised may receive this vaccine at local pharmacy or Health Dept. Aware to provide a copy of the vaccination record if obtained from local pharmacy or Health Dept. Verbalized acceptance and understanding.   Flu Vaccine Status: Due 04/2020  Pneumococcal Vaccine Status:Due-Patient to discuss with PCP at next office visit.   Covid-19 vaccine status: Declined, Education has been provided regarding the importance of this vaccine but patient still declined. Advised may receive this vaccine at local pharmacy or Health Dept.or vaccine clinic. Aware to provide a copy of the vaccination record if obtained from local pharmacy or Health Dept. Verbalized acceptance and understanding.  Qualifies for Shingles Vaccine? Yes   Zostavax completed No   Shingrix Completed?: No.    Education has been provided regarding the importance of this vaccine. Patient has been advised to call insurance company to determine out of pocket expense if they have not yet received this vaccine. Advised may also receive vaccine at local pharmacy or Health Dept. Verbalized acceptance and understanding.  Screening Tests Health Maintenance  Topic Date Due  . Hepatitis C Screening  Never done  . TETANUS/TDAP  Never done  . PNA vac Low Risk Adult (1 of 2 - PCV13) Never done  . COVID-19 Vaccine (1) 04/04/2020 (Originally 11/30/1962)  . INFLUENZA VACCINE  03/22/2020  . COLONOSCOPY  03/22/2022    Health Maintenance  Health Maintenance Due  Topic Date Due  . Hepatitis C Screening  Never done  . TETANUS/TDAP  Never done  . PNA vac Low Risk Adult (1 of 2 -  PCV13) Never done    Colorectal cancer screening: Completed 03/22/2017. Repeat every 5 years  Lung Cancer Screening: (Low Dose CT Chest recommended if Age 39-80 years, 30 pack-year currently smoking OR have quit w/in 15years.) does not qualify.     Additional Screening:  Hepatitis C Screening: does qualify; Discuss with PCP  Vision Screening: Recommended annual ophthalmology exams for early detection of glaucoma and other disorders of the eye. Is the patient up to date with their annual eye exam?  Yes  Who is the provider or what is the name of the office in which the patient attends annual eye exams? Unsure of name   Dental Screening: Recommended annual dental exams for proper oral hygiene  Community Resource Referral / Chronic Care Management: CRR required this visit?  Yes  For food & utility cost assistance  CCM required this visit?  No      Plan:     I have personally reviewed and noted the following in the patient's chart:   . Medical and social history . Use of alcohol, tobacco or illicit drugs  . Current medications and supplements . Functional ability and status . Nutritional status . Physical activity . Advanced directives . List of other physicians . Hospitalizations, surgeries, and ER visits in previous 12 months . Vitals . Screenings to include cognitive, depression, and falls . Referrals and appointments  In addition, I have reviewed and discussed with patient certain preventive protocols, quality metrics, and best practice recommendations. A written personalized care plan for preventive services as well as general preventive health recommendations were provided to patient.    Due to this being a telephonic visit, the after visit summary with patients personalized plan was offered to patient via mail or my-chart.  Patient would like to access on my-chart.   Marta Antu, LPN   11/28/8117  Nurse Health Advisor  Nurse Notes: None

## 2020-04-08 ENCOUNTER — Telehealth: Payer: Self-pay | Admitting: Family Medicine

## 2020-04-08 NOTE — Telephone Encounter (Signed)
Spoke with patient who states that he spoke with someone regarding assistant with providing food and helping with light bill. Do you recall? Please advise message below

## 2020-04-08 NOTE — Telephone Encounter (Signed)
Patient states that he was supposed to receive a phone call with resources to help with his food, electricity, etc. He states that he still has not heard from anyone. Please give him a call back at [phone].

## 2020-04-09 NOTE — Telephone Encounter (Signed)
Hi Sheneka, Could you please check on this referral for this patient. I saw him on 03/19/20 & placed a referral for assistance with food & utilities.  Thanks , Jana Half

## 2020-04-20 ENCOUNTER — Telehealth: Payer: Self-pay

## 2020-04-20 NOTE — Telephone Encounter (Signed)
    MA8/30/2021 1st Attempt  Name: Logan Orr   MRN: [id number]   DOB: 1951/05/11   AGE: 69 y.o.   GENDER: male   PCP Libby Maw, MD.   04/10/20 Spoke with patient about applying for food stamps, spoke with Viola they are mailing and application to patient. Verified email sent information about [employer] for food pantry and utility assistance. Will follow-up with patient next week to ensure he has received application and emailed resources.    Beaumont Austad, AAS Paralegal, [place] . Embedded Care Coordination [facility] Health  Care Management  [address], [place] [address] [email or web address]  [phone]   [email or web address]

## 2020-04-20 NOTE — Telephone Encounter (Signed)
    MA8/30/2021 Follow-up  Name: Logan Orr   MRN: [id number]   DOB: 13-Oct-1950   AGE: 69 y.o.   GENDER: male   PCP Libby Maw, MD.   04/20/20 Left message to follow-up with patient about food and utility assistance resources given. Will attemp to call again this week.   Deno Sida, AAS Paralegal, [place] . Embedded Care Coordination [facility] Health  Care Management  [address], [place] [address] [email or web address]  [phone]   [email or web address]

## 2020-04-21 NOTE — Telephone Encounter (Signed)
    MA8/31/2021 2nd Attempt Follow-up  Name: Logan Orr   MRN: [id number]   DOB: 12-02-1950   AGE: 69 y.o.   GENDER: male   PCP Libby Maw, MD.   04/21/20  Left message to follow-up with patient about food and utility assistance resources given. Will attemp to call again 04/22/20.    Auden Tatar, AAS Paralegal, [place] . Embedded Care Coordination [facility] Health  Care Management  [address], [place] [address] [email or web address]  [phone]   [email or web address]

## 2020-04-22 NOTE — Telephone Encounter (Signed)
    MA9/08/2019 3rd Attempt  Name: Logan Orr   MRN: [id number]   DOB: Jun 09, 1951   AGE: 69 y.o.   GENDER: male   PCP Libby Maw, MD.   04/22/20 Left message to follow-up with patient about food and utility assistance resources given. 3rd unsuccessful attempt to contact patient. Closing referral.    Nilay Mangrum, AAS Paralegal, [place] . Embedded Care Coordination [facility] Health  Care Management  [address], [place] [address] [email or web address]  [phone]  [email or web address]

## 2020-04-30 ENCOUNTER — Other Ambulatory Visit: Payer: Self-pay

## 2020-05-01 ENCOUNTER — Encounter: Payer: Self-pay | Admitting: Family Medicine

## 2020-05-01 ENCOUNTER — Ambulatory Visit (INDEPENDENT_AMBULATORY_CARE_PROVIDER_SITE_OTHER): Payer: Medicare HMO | Admitting: Family Medicine

## 2020-05-01 VITALS — BP 124/80 | HR 83 | Temp 97.8°F | Ht 69.0 in | Wt 199.2 lb

## 2020-05-01 DIAGNOSIS — Z9119 Patient's noncompliance with other medical treatment and regimen: Secondary | ICD-10-CM | POA: Diagnosis not present

## 2020-05-01 DIAGNOSIS — E079 Disorder of thyroid, unspecified: Secondary | ICD-10-CM

## 2020-05-01 DIAGNOSIS — Z789 Other specified health status: Secondary | ICD-10-CM

## 2020-05-01 DIAGNOSIS — D649 Anemia, unspecified: Secondary | ICD-10-CM

## 2020-05-01 DIAGNOSIS — R634 Abnormal weight loss: Secondary | ICD-10-CM | POA: Diagnosis not present

## 2020-05-01 DIAGNOSIS — F418 Other specified anxiety disorders: Secondary | ICD-10-CM

## 2020-05-01 DIAGNOSIS — Z91199 Patient's noncompliance with other medical treatment and regimen due to unspecified reason: Secondary | ICD-10-CM

## 2020-05-01 DIAGNOSIS — I10 Essential (primary) hypertension: Secondary | ICD-10-CM | POA: Diagnosis not present

## 2020-05-01 DIAGNOSIS — E7800 Pure hypercholesterolemia, unspecified: Secondary | ICD-10-CM

## 2020-05-01 DIAGNOSIS — R630 Anorexia: Secondary | ICD-10-CM | POA: Diagnosis not present

## 2020-05-01 DIAGNOSIS — Z7289 Other problems related to lifestyle: Secondary | ICD-10-CM | POA: Diagnosis not present

## 2020-05-01 DIAGNOSIS — E538 Deficiency of other specified B group vitamins: Secondary | ICD-10-CM

## 2020-05-01 MED ORDER — AMLODIPINE BESYLATE 10 MG PO TABS: 10.0000 mg | ORAL_TABLET | Freq: Every day | ORAL | 1 refills | Status: DC

## 2020-05-01 MED ORDER — ATORVASTATIN CALCIUM 20 MG PO TABS: 20.0000 mg | ORAL_TABLET | Freq: Every day | ORAL | 3 refills | Status: DC

## 2020-05-01 NOTE — Progress Notes (Signed)
Established Patient Office Visit  Subjective:  Patient ID: Logan Orr, male    DOB: [birth year]  Age: 69 y.o. MRN: [id number]  CC:  Chief Complaint  Patient presents with  . Follow-up    3 month follow up per patient he does not have an appetite losing weight x 2-3 months     HPI [facility] presents for follow-up of hypertension, elevated cholesterol, B12 deficiency, anemia, thyroid mass and weight loss.  Has been losing weight due to lack of appetite.  He denies depression.  He denies difficulty chewing or swallowing his food.  There is been no nausea or vomiting or indigestion.  He is moving his bowels normally.  There has been no abdominal pain.  Status post recent follow-up with GI.  Ongoing follow-up with urology for prostate cancer.  He has yet to go for the ultrasound of his thyroid mass because the sleep study people told him that there was nothing wrong with his thyroid, he says.  Averages 2-3 servings of alcohol several times weekly.  He is B12 deficient and pharmacist recommended B complex.  His folate levels are normal.  He is yet to start a multivitamin with iron.  Past Medical History:  Diagnosis Date  . Hemorrhoids   . History of colon polyps   . HLD (hyperlipidemia)   . Hyperplasia of prostate with lower urinary tract symptoms (LUTS)   . Hypertension   . Prostate cancer [facility]) urologist-- dr bell/  oncologist-- dr Tammi Klippel   dx 11-23-2018  Stage T1c,  3+4  . Thyroid nodule    08-03-2018  pt pcp note in epic, left side, ultasound ordered   . Wears glasses     Past Surgical History:  Procedure Laterality Date  . COLONOSCOPY    . CYSTOSCOPY N/A 02/04/2019   Procedure: CYSTOSCOPY;  Surgeon: Lucas Mallow, MD;  Location: [facility];  Service: Urology;  Laterality: N/A;  no seeds seen per Dr Gloriann Loan  . LACERATION REPAIR  1990s   repair left lower arm complex laceration   . PROSTATE BIOPSY  11-23-2018   at dr Gloriann Loan office  . RADIOACTIVE SEED  IMPLANT N/A 02/04/2019   Procedure: RADIOACTIVE SEED IMPLANT/BRACHYTHERAPY IMPLANT;  Surgeon: Lucas Mallow, MD;  Location: [facility];  Service: Urology;  Laterality: N/A;  90 seeds  . SPACE OAR INSTILLATION N/A 02/04/2019   Procedure: SPACE OAR INSTILLATION;  Surgeon: Lucas Mallow, MD;  Location: [facility];  Service: Urology;  Laterality: N/A;    Family History  Adopted: Yes    Social History   Socioeconomic History  . Marital status: Single    Spouse name: Not on file  . Number of children: 1  . Years of education: Not on file  . Highest education level: Not on file  Occupational History    Comment: unemployed   Tobacco Use  . Smoking status: Never Smoker  . Smokeless tobacco: Never Used  Vaping Use  . Vaping Use: Never used  Substance and Sexual Activity  . Alcohol use: Yes    Alcohol/week: 21.0 standard drinks    Types: 21 Cans of beer per week    Comment: 3-4 beers daily  . Drug use: Never  . Sexual activity: Yes  Other Topics Concern  . Not on file  Social History Narrative  . Not on file   Social Determinants of Health   Financial Resource Strain: Medium Risk  . Difficulty of Paying  Living Expenses: Somewhat hard  Food Insecurity: Food Insecurity Present  . Worried About Charity fundraiser in the Last Year: Sometimes true  . Ran Out of Food in the Last Year: Sometimes true  Transportation Needs: No Transportation Needs  . Lack of Transportation (Medical): No  . Lack of Transportation (Non-Medical): No  Physical Activity:   . Days of Exercise per Week: Not on file  . Minutes of Exercise per Session: Not on file  Stress: No Stress Concern Present  . Feeling of Stress : Not at all  Social Connections: Socially Isolated  . Frequency of Communication with Friends and Family: More than three times a week  . Frequency of Social Gatherings with Friends and Family: More than three times a week  . Attends Religious  Services: Never  . Active Member of Clubs or Organizations: No  . Attends Archivist Meetings: Never  . Marital Status: Never married  Intimate Partner Violence: Not At Risk  . Fear of Current or Ex-Partner: No  . Emotionally Abused: No  . Physically Abused: No  . Sexually Abused: No    Outpatient Medications Prior to Visit  Medication Sig Dispense Refill  . b complex vitamins capsule Take 1 capsule by mouth daily. 90 capsule 1  . polyethylene glycol (MIRALAX / GLYCOLAX) 17 g packet Take 17 g by mouth daily as needed for moderate constipation.    . tamsulosin (FLOMAX) 0.4 MG CAPS capsule Take 2 capsules (0.8 mg total) by mouth daily after supper. 180 capsule 3  . amLODipine (NORVASC) 10 MG tablet Take 1 tablet (10 mg total) by mouth daily. 90 tablet 1  . atorvastatin (LIPITOR) 20 MG tablet Take 1 tablet (20 mg total) by mouth daily. 90 tablet 3  . docusate sodium (COLACE) 100 MG capsule Take 1 capsule (100 mg total) by mouth 2 (two) times daily. (Patient not taking: Reported on 05/01/2020) 10 capsule 0   No facility-administered medications prior to visit.    No Known Allergies  ROS Review of Systems  Constitutional: Negative.   HENT: Negative.   Respiratory: Negative.   Cardiovascular: Negative.   Gastrointestinal: Negative.   Genitourinary: Negative.   Musculoskeletal: Negative.   Psychiatric/Behavioral: Negative.    Depression screen [facility] 2/9 05/01/2020 03/19/2020 10/03/2019  Decreased Interest 3 0 0  Down, Depressed, Hopeless 1 0 0  PHQ - 2 Score 4 0 0  Altered sleeping 0 - -  Tired, decreased energy 1 - -  Change in appetite 2 - -  Feeling bad or failure about yourself  0 - -  Trouble concentrating 0 - -  Moving slowly or fidgety/restless 0 - -  Suicidal thoughts 0 - -  PHQ-9 Score 7 - -  Difficult doing work/chores Somewhat difficult - -      Objective:    Physical Exam Vitals and nursing note reviewed.  Constitutional:      General: He is not in  acute distress.    Appearance: Normal appearance. He is normal weight. He is not ill-appearing or toxic-appearing.  HENT:     Head: Normocephalic and atraumatic.     Right Ear: External ear normal.     Left Ear: External ear normal.  Eyes:     General: No scleral icterus.       Right eye: No discharge.        Left eye: No discharge.     Conjunctiva/sclera: Conjunctivae normal.  Neck:     Thyroid: Thyroid mass present.  Cardiovascular:     Rate and Rhythm: Normal rate and regular rhythm.  Pulmonary:     Effort: Pulmonary effort is normal.     Breath sounds: Normal breath sounds.  Abdominal:     General: Abdomen is flat. Bowel sounds are normal. There is no distension.     Palpations: Abdomen is soft.     Tenderness: There is no abdominal tenderness. There is no guarding or rebound.     Hernia: No hernia is present.  Musculoskeletal:     Cervical back: No rigidity or tenderness.     Right lower leg: No edema.     Left lower leg: No edema.  Lymphadenopathy:     Cervical: No cervical adenopathy.  Skin:    General: Skin is warm and dry.  Neurological:     Mental Status: He is alert and oriented to person, place, and time.  Psychiatric:        Mood and Affect: Mood normal.        Behavior: Behavior normal.     BP 124/80   Pulse 83   Temp 97.8 F (36.6 C) (Tympanic)   Ht 5\' 9"  (1.753 m)   Wt 199 lb 3.2 oz (90.4 kg)   SpO2 97%   BMI 29.42 kg/m  Wt Readings from Last 3 Encounters:  05/01/20 199 lb 3.2 oz (90.4 kg)  03/19/20 (!) 213 lb (96.6 kg)  02/06/20 212 lb 4 oz (96.3 kg)     Health Maintenance Due  Topic Date Due  . Hepatitis C Screening  Never done  . COVID-19 Vaccine (1) Never done  . TETANUS/TDAP  Never done  . PNA vac Low Risk Adult (1 of 2 - PCV13) Never done  . INFLUENZA VACCINE  Never done    There are no preventive care reminders to display for this patient.  Lab Results  Component Value Date   TSH 0.39 03/14/2019   Lab Results  Component  Value Date   WBC 4.5 01/28/2020   HGB 12.9 (L) 01/28/2020   HCT 38.7 (L) 01/28/2020   MCV 88.0 01/28/2020   PLT 333.0 01/28/2020   Lab Results  Component Value Date   NA 141 01/28/2020   K 4.1 01/28/2020   CO2 29 01/28/2020   GLUCOSE 105 (H) 01/28/2020   BUN 14 01/28/2020   CREATININE 1.36 01/28/2020   BILITOT 0.5 01/28/2020   ALKPHOS 95 01/28/2020   AST 21 01/28/2020   ALT 21 01/28/2020   PROT 7.0 01/28/2020   ALBUMIN 4.3 01/28/2020   CALCIUM 9.6 01/28/2020   ANIONGAP 10 01/31/2019   GFR 63.02 01/28/2020   Lab Results  Component Value Date   CHOL 281 (H) 08/03/2018   Lab Results  Component Value Date   HDL 55 08/03/2018   Lab Results  Component Value Date   LDLCALC 199 (H) 08/03/2018   Lab Results  Component Value Date   TRIG 122 08/03/2018   Lab Results  Component Value Date   CHOLHDL 5.1 (H) 08/03/2018   No results found for: HGBA1C    Assessment & Plan:   Problem List Items Addressed This Visit      Cardiovascular and Mediastinum   Essential hypertension   Relevant Medications   amLODipine (NORVASC) 10 MG tablet   atorvastatin (LIPITOR) 20 MG tablet   Other Relevant Orders   Comprehensive metabolic panel     Other   Elevated LDL cholesterol level   Relevant Medications   atorvastatin (LIPITOR) 20 MG tablet  Other Relevant Orders   LDL cholesterol, direct   Alcohol use   Non-compliant patient   Anemia   Relevant Orders   CBC   Iron, TIBC and Ferritin Panel   B12 deficiency - Primary   Relevant Orders   Vitamin B12   Poor appetite   Relevant Orders   Amylase   Hepatitis C antibody   Weight loss   Relevant Orders   Hepatitis C antibody   Depression with anxiety   Relevant Orders   Ambulatory referral to Psychology    Other Visit Diagnoses    Thyroid mass       Relevant Orders   US THYROID   TSH    D he is working on a PHQ-9 he should be done without by now  Meds ordered this encounter  Medications  . amLODipine  (NORVASC) 10 MG tablet    Sig: Take 1 tablet (10 mg total) by mouth daily.    Dispense:  90 tablet    Refill:  1  . atorvastatin (LIPITOR) 20 MG tablet    Sig: Take 1 tablet (20 mg total) by mouth daily.    Dispense:  90 tablet    Refill:  3    Follow-up: Return in about 1 month (around 05/31/2020), or please start a multivitamin with iron.   Above patient denied depression but when he filled out the PHQ-9 was a different story.  When I asked him about this he said that he did not mean to filling out the way he did.  There was no eye contact with me when I asked about it.  Libby Maw, MD

## 2020-05-04 ENCOUNTER — Ambulatory Visit
Admission: RE | Admit: 2020-05-04 | Discharge: 2020-05-04 | Disposition: A | Discharge status: Home / Self Care | Payer: Medicare HMO | Source: Ambulatory Visit | Attending: Family Medicine | Admitting: Family Medicine

## 2020-05-04 DIAGNOSIS — E079 Disorder of thyroid, unspecified: Secondary | ICD-10-CM

## 2020-05-04 DIAGNOSIS — E010 Iodine-deficiency related diffuse (endemic) goiter: Secondary | ICD-10-CM | POA: Diagnosis not present

## 2020-05-05 NOTE — Addendum Note (Signed)
Addended by: Jon Billings on: 05/05/2020 08:03 AM   Modules accepted: Orders

## 2020-05-08 ENCOUNTER — Telehealth: Payer: Self-pay

## 2020-05-08 NOTE — Progress Notes (Signed)
    Chronic Care Management Pharmacy Assistant   Name: Erinn Huskins  MRN: [id number] DOB: 01/06/1951  Reason for Encounter: Medication Review General Adherence Call.   PCP : Libby Maw, MD  Allergies:  No Known Allergies  Medications: Outpatient Encounter Medications as of 05/08/2020  Medication Sig  . amLODipine (NORVASC) 10 MG tablet Take 1 tablet (10 mg total) by mouth daily.  Marland Kitchen atorvastatin (LIPITOR) 20 MG tablet Take 1 tablet (20 mg total) by mouth daily.  Marland Kitchen b complex vitamins capsule Take 1 capsule by mouth daily.  Marland Kitchen docusate sodium (COLACE) 100 MG capsule Take 1 capsule (100 mg total) by mouth 2 (two) times daily. (Patient not taking: Reported on 05/01/2020)  . polyethylene glycol (MIRALAX / GLYCOLAX) 17 g packet Take 17 g by mouth daily as needed for moderate constipation.  . tamsulosin (FLOMAX) 0.4 MG CAPS capsule Take 2 capsules (0.8 mg total) by mouth daily after supper.   No facility-administered encounter medications on file as of 05/08/2020.    Current Diagnosis: Patient Active Problem List   Diagnosis Date Noted  . Poor appetite 05/01/2020  . Weight loss 05/01/2020  . Depression with anxiety 05/01/2020  . Rectal bleeding 01/28/2020  . Slow transit constipation 01/28/2020  . B12 deficiency 10/07/2019  . Anemia 10/03/2019  . Alcohol use 03/14/2019  . Polyp of colon 03/14/2019  . Midline low back pain without sciatica 03/14/2019  . Non-compliant patient 03/14/2019  . Malignant neoplasm of prostate ([place]) 12/11/2018  . Elevated LDL cholesterol level 08/31/2018  . Elevated PSA, less than 10 ng/ml 08/31/2018  . Essential hypertension 08/31/2018  . Healthcare maintenance 08/03/2018  . Benign prostatic hyperplasia with urinary hesitancy 08/03/2018      Follow-Up:  Pharmacist Review   Called patient and discussed medication adherence  with patient, no  issues at this time with current medication.   Patient denies ED visit since her last CPP follow  up.  Patient denies any side effects with her medication. Patient denies any problems with her current pharmacy Patient reports taking his medication as prescribe,and not missing a dose.  Patient states he does check his blood pressure weekly at [facility] but does not keep a log.  Patient states his exercise is work . Patient states he cooks at home with salt for taste and eats out at restaurants.  Patient reports he is doing well, and has no complaints at this time.  [place] Pharmacist Assistant [phone]

## 2020-05-19 ENCOUNTER — Ambulatory Visit: Payer: Medicare HMO | Admitting: Endocrinology

## 2020-06-01 ENCOUNTER — Ambulatory Visit: Payer: Medicare HMO | Admitting: Family Medicine

## 2020-06-02 ENCOUNTER — Telehealth (INDEPENDENT_AMBULATORY_CARE_PROVIDER_SITE_OTHER): Payer: Medicare HMO | Admitting: Family Medicine

## 2020-06-02 ENCOUNTER — Encounter: Payer: Self-pay | Admitting: Family Medicine

## 2020-06-02 VITALS — Ht 69.0 in | Wt 218.0 lb

## 2020-06-02 DIAGNOSIS — F321 Major depressive disorder, single episode, moderate: Secondary | ICD-10-CM | POA: Diagnosis not present

## 2020-06-02 DIAGNOSIS — E538 Deficiency of other specified B group vitamins: Secondary | ICD-10-CM | POA: Diagnosis not present

## 2020-06-02 DIAGNOSIS — E079 Disorder of thyroid, unspecified: Secondary | ICD-10-CM | POA: Diagnosis not present

## 2020-06-02 MED ORDER — VITAMIN B-12 1000 MCG PO TABS: 1000.0000 ug | ORAL_TABLET | Freq: Every day | ORAL | 2 refills | Status: DC

## 2020-06-02 MED ORDER — PAROXETINE HCL 10 MG PO TABS: ORAL_TABLET | ORAL | 0 refills | Status: DC

## 2020-06-02 NOTE — Progress Notes (Signed)
Established Patient Office Visit  Subjective:  Patient ID: Logan Orr, male    DOB: Sep 25, 1950  Age: 69 y.o. MRN: [id number]  CC:  Chief Complaint  Patient presents with  . Follow-up    1 month follow up, no concerns.     HPI Logan Orr presents for follow-up of depression/B12 deficiency and thyroid mass.  Patient has upcoming appointment with endocrinology for follow-up of thyroid nodules in need of possible biopsies.  Has not been able to start a multivitamin or B12 replacement yet.  He is interested in starting a medication for mood elevation.  He has no local pharmacy only uses mail order.  Past Medical History:  Diagnosis Date  . Hemorrhoids   . History of colon polyps   . HLD (hyperlipidemia)   . Hyperplasia of prostate with lower urinary tract symptoms (LUTS)   . Hypertension   . Prostate cancer [facility]) urologist-- dr bell/  oncologist-- dr Tammi Klippel   dx 11-23-2018  Stage T1c,  3+4  . Thyroid nodule    08-03-2018  pt pcp note in epic, left side, ultasound ordered   . Wears glasses     Past Surgical History:  Procedure Laterality Date  . COLONOSCOPY    . CYSTOSCOPY N/A 02/04/2019   Procedure: CYSTOSCOPY;  Surgeon: Lucas Mallow, MD;  Location: [facility];  Service: Urology;  Laterality: N/A;  no seeds seen per Dr Gloriann Loan  . LACERATION REPAIR  1990s   repair left lower arm complex laceration   . PROSTATE BIOPSY  11-23-2018   at dr Gloriann Loan office  . RADIOACTIVE SEED IMPLANT N/A 02/04/2019   Procedure: RADIOACTIVE SEED IMPLANT/BRACHYTHERAPY IMPLANT;  Surgeon: Lucas Mallow, MD;  Location: [facility];  Service: Urology;  Laterality: N/A;  90 seeds  . SPACE OAR INSTILLATION N/A 02/04/2019   Procedure: SPACE OAR INSTILLATION;  Surgeon: Lucas Mallow, MD;  Location: [facility];  Service: Urology;  Laterality: N/A;    Family History  Adopted: Yes    Social History   Socioeconomic History  . Marital status:  Single    Spouse name: Not on file  . Number of children: 1  . Years of education: Not on file  . Highest education level: Not on file  Occupational History    Comment: unemployed   Tobacco Use  . Smoking status: Never Smoker  . Smokeless tobacco: Never Used  Vaping Use  . Vaping Use: Never used  Substance and Sexual Activity  . Alcohol use: Yes    Alcohol/week: 21.0 standard drinks    Types: 21 Cans of beer per week    Comment: 3-4 beers daily  . Drug use: Never  . Sexual activity: Yes  Other Topics Concern  . Not on file  Social History Narrative  . Not on file   Social Determinants of Health   Financial Resource Strain: Medium Risk  . Difficulty of Paying Living Expenses: Somewhat hard  Food Insecurity: Food Insecurity Present  . Worried About Charity fundraiser in the Last Year: Sometimes true  . Ran Out of Food in the Last Year: Sometimes true  Transportation Needs: No Transportation Needs  . Lack of Transportation (Medical): No  . Lack of Transportation (Non-Medical): No  Physical Activity:   . Days of Exercise per Week: Not on file  . Minutes of Exercise per Session: Not on file  Stress: No Stress Concern Present  . Feeling of Stress : Not  at all  Social Connections: Socially Isolated  . Frequency of Communication with Friends and Family: More than three times a week  . Frequency of Social Gatherings with Friends and Family: More than three times a week  . Attends Religious Services: Never  . Active Member of Clubs or Organizations: No  . Attends Archivist Meetings: Never  . Marital Status: Never married  Intimate Partner Violence: Not At Risk  . Fear of Current or Ex-Partner: No  . Emotionally Abused: No  . Physically Abused: No  . Sexually Abused: No    Outpatient Medications Prior to Visit  Medication Sig Dispense Refill  . amLODipine (NORVASC) 10 MG tablet Take 1 tablet (10 mg total) by mouth daily. 90 tablet 1  . atorvastatin (LIPITOR)  20 MG tablet Take 1 tablet (20 mg total) by mouth daily. 90 tablet 3  . b complex vitamins capsule Take 1 capsule by mouth daily. 90 capsule 1  . polyethylene glycol (MIRALAX / GLYCOLAX) 17 g packet Take 17 g by mouth daily as needed for moderate constipation.    . tamsulosin (FLOMAX) 0.4 MG CAPS capsule Take 2 capsules (0.8 mg total) by mouth daily after supper. 180 capsule 3  . docusate sodium (COLACE) 100 MG capsule Take 1 capsule (100 mg total) by mouth 2 (two) times daily. (Patient not taking: Reported on 05/01/2020) 10 capsule 0   No facility-administered medications prior to visit.    No Known Allergies  ROS Review of Systems  Constitutional: Negative.   HENT: Negative.   Eyes: Negative for photophobia and visual disturbance.  Respiratory: Negative.   Cardiovascular: Negative.   Gastrointestinal: Negative.   Endocrine: Negative for polyphagia and polyuria.  Psychiatric/Behavioral: Positive for dysphoric mood.       Depression screen [facility] [address] 2/9 05/01/2020 05/01/2020 03/19/2020  Decreased Interest 3 3 0  Down, Depressed, Hopeless 1 1 0  PHQ - 2 Score 4 4 0  Altered sleeping 3 0 -  Tired, decreased energy 2 1 -  Change in appetite 0 2 -  Feeling bad or failure about yourself  0 0 -  Trouble concentrating 0 0 -  Moving slowly or fidgety/restless 0 0 -  Suicidal thoughts 0 0 -  PHQ-9 Score 9 7 -  Difficult doing work/chores Not difficult at all Somewhat difficult -    Objective:    Physical Exam  Ht 5\' 9"  (1.753 m)   Wt 218 lb (98.9 kg)   BMI 32.19 kg/m  Wt Readings from Last 3 Encounters:  06/02/20 218 lb (98.9 kg)  05/01/20 199 lb 3.2 oz (90.4 kg)  03/19/20 (!) 213 lb (96.6 kg)     Health Maintenance Due  Topic Date Due  . Hepatitis C Screening  Never done  . COVID-19 Vaccine (1) Never done  . TETANUS/TDAP  Never done  . PNA vac Low Risk Adult (1 of 2 - PCV13) Never done  . INFLUENZA VACCINE  Never done    There are no preventive care reminders to display  for this patient.  Lab Results  Component Value Date   TSH 0.39 03/14/2019   Lab Results  Component Value Date   WBC 4.5 01/28/2020   HGB 12.9 (L) 01/28/2020   HCT 38.7 (L) 01/28/2020   MCV 88.0 01/28/2020   PLT 333.0 01/28/2020   Lab Results  Component Value Date   NA 141 01/28/2020   K 4.1 01/28/2020   CO2 29 01/28/2020   GLUCOSE 105 (H) 01/28/2020  BUN 14 01/28/2020   CREATININE 1.36 01/28/2020   BILITOT 0.5 01/28/2020   ALKPHOS 95 01/28/2020   AST 21 01/28/2020   ALT 21 01/28/2020   PROT 7.0 01/28/2020   ALBUMIN 4.3 01/28/2020   CALCIUM 9.6 01/28/2020   ANIONGAP 10 01/31/2019   GFR 63.02 01/28/2020   Lab Results  Component Value Date   CHOL 281 (H) 08/03/2018   Lab Results  Component Value Date   HDL 55 08/03/2018   Lab Results  Component Value Date   LDLCALC 199 (H) 08/03/2018   Lab Results  Component Value Date   TRIG 122 08/03/2018   Lab Results  Component Value Date   CHOLHDL 5.1 (H) 08/03/2018   No results found for: HGBA1C    Assessment & Plan:   Problem List Items Addressed This Visit      Other   Thyroid mass   B12 deficiency - Primary   Relevant Medications   vitamin B-12 (CYANOCOBALAMIN) 1000 MCG tablet   Depression, major, single episode, moderate (HCC)   Relevant Medications   PARoxetine (PAXIL) 10 MG tablet      Meds ordered this encounter  Medications  . PARoxetine (PAXIL) 10 MG tablet    Sig: Take one daily for one week and then increase to 2 daily.    Dispense:  180 tablet    Refill:  0  . vitamin B-12 (CYANOCOBALAMIN) 1000 MCG tablet    Sig: Take 1 tablet (1,000 mcg total) by mouth daily.    Dispense:  90 tablet    Refill:  2    Follow-up: Return in about 6 weeks (around 07/14/2020).  He will start Paxil.  Discussed the need for him to adjust the time of day it is taken the past works for him.  Advised him medication works slowly and he will need to give it time.  Rx for high-dose B12 sent to pharmacy.  He will  also start multivitamin without iron such as Centrum silver.  Follow-up in 5 to 6 weeks to check progress with the Paxil.  Libby Maw, MD   Virtual Visit via Video Note  I connected with Antonietta Jewel on 06/02/20 at  8:30 AM EDT by a video enabled telemedicine application and verified that I am speaking with the correct person using two identifiers.  Location: Patient: alone in his car.  Provider:    I discussed the limitations of evaluation and management by telemedicine and the availability of in person appointments. The patient expressed understanding and agreed to proceed.  History of Present Illness:    Observations/Objective:   Assessment and Plan:   Follow Up Instructions:    I discussed the assessment and treatment plan with the patient. The patient was provided an opportunity to ask questions and all were answered. The patient agreed with the plan and demonstrated an understanding of the instructions.   The patient was advised to call back or seek an in-person evaluation if the symptoms worsen or if the condition fails to improve as anticipated.  I provided 25 minutes of non-face-to-face time during this encounter.   Libby Maw, MD

## 2020-06-02 NOTE — Addendum Note (Signed)
Addended by: Jon Billings on: 06/02/2020 08:47 AM   Modules accepted: Orders

## 2020-06-03 ENCOUNTER — Other Ambulatory Visit: Payer: Self-pay

## 2020-06-03 ENCOUNTER — Other Ambulatory Visit: Payer: Medicare HMO

## 2020-06-03 DIAGNOSIS — R634 Abnormal weight loss: Secondary | ICD-10-CM | POA: Diagnosis not present

## 2020-06-03 DIAGNOSIS — R630 Anorexia: Secondary | ICD-10-CM | POA: Diagnosis not present

## 2020-06-03 LAB — COMPREHENSIVE METABOLIC PANEL
ALT: 13 U/L (ref 0–53)
AST: 18 U/L (ref 0–37)
Albumin: 4.2 g/dL (ref 3.5–5.2)
Alkaline Phosphatase: 84 U/L (ref 39–117)
BUN: 17 mg/dL (ref 6–23)
CO2: 26 mEq/L (ref 19–32)
Calcium: 9.8 mg/dL (ref 8.4–10.5)
Chloride: 104 mEq/L (ref 96–112)
Creatinine, Ser: 1.24 mg/dL (ref 0.40–1.50)
GFR: 59.15 mL/min — ABNORMAL LOW (ref >60.00)
Glucose, Bld: 96 mg/dL (ref 70–99)
Potassium: 4 mEq/L (ref 3.5–5.1)
Sodium: 138 mEq/L (ref 135–145)
Total Bilirubin: 0.6 mg/dL (ref 0.2–1.2)
Total Protein: 7.1 g/dL (ref 6.0–8.3)

## 2020-06-03 LAB — CBC
HCT: 40.5 % (ref 39.0–52.0)
Hemoglobin: 13.8 g/dL (ref 13.0–17.0)
MCHC: 34 g/dL (ref 30.0–36.0)
MCV: 88.7 fl (ref 78.0–100.0)
Platelets: 293 10*3/uL (ref 150.0–400.0)
RBC: 4.57 Mil/uL (ref 4.22–5.81)
RDW: 14.4 % (ref 11.5–15.5)
WBC: 4 10*3/uL (ref 4.0–10.5)

## 2020-06-03 LAB — AMYLASE: Amylase: 73 U/L (ref 27–131)

## 2020-06-03 LAB — LDL CHOLESTEROL, DIRECT: Direct LDL: 86 mg/dL

## 2020-06-03 LAB — TSH: TSH: 0.72 u[IU]/mL (ref 0.35–4.50)

## 2020-06-03 LAB — VITAMIN B12: Vitamin B-12: 174 pg/mL — ABNORMAL LOW (ref 211–911)

## 2020-06-04 LAB — HEPATITIS C ANTIBODY
Hepatitis C Ab: NONREACTIVE
SIGNAL TO CUT-OFF: 0.01 (ref <1.00)

## 2020-06-09 ENCOUNTER — Telehealth: Payer: Self-pay | Admitting: Family Medicine

## 2020-06-09 ENCOUNTER — Other Ambulatory Visit: Payer: Self-pay

## 2020-06-09 ENCOUNTER — Ambulatory Visit (INDEPENDENT_AMBULATORY_CARE_PROVIDER_SITE_OTHER): Payer: Medicare HMO | Admitting: Endocrinology

## 2020-06-09 ENCOUNTER — Encounter: Payer: Self-pay | Admitting: Endocrinology

## 2020-06-09 VITALS — BP 112/78 | HR 81 | Ht 69.0 in | Wt 205.4 lb

## 2020-06-09 DIAGNOSIS — E042 Nontoxic multinodular goiter: Secondary | ICD-10-CM

## 2020-06-09 DIAGNOSIS — E041 Nontoxic single thyroid nodule: Secondary | ICD-10-CM

## 2020-06-09 NOTE — Progress Notes (Signed)
Patient ID: Logan Orr, male   DOB: 1951-04-21, 69 y.o.   MRN: [id number]          Reason for Appointment: Goiter, new consultation    History of Present Illness:   Patient has been referred by Dr. Abelino Derrick  The patient's thyroid enlargement was first discovered in 04/2020 on a routine exam  He has not felt any swelling in the neck himself Does not feel any pressure, local discomfort or choking sensation or difficulty swallowing  Also has no unusual fatigue and thyroid level has been normal  Lab Results  Component Value Date   TSH 0.72 06/03/2020   TSH 0.39 03/14/2019   TSH 0.43 08/03/2018    He has had an ultrasound exam in 04/2020 which showed   Borderline thyromegaly with findings suggestive of multinodular goiter.  Nodule #1 location: Isthmus; Mid  Maximum size: 2.0 cm; Other 2 dimensions: 1.6 x 1.5 cm  Composition: solid/almost completely solid (2)  Echogenicity: hypoechoic (2)  Shape: not taller-than-wide (0)  Margins: lobulated/irregular (2)   Echogenic foci: none (0)  ACR TI-RADS total points: 6.  2. Nodules #1 and #4 both meet imaging criteria to recommend percutaneous sampling as indicated. 3. Nodule #5 meets imaging criteria to recommend a 1 year follow-up.    Allergies as of 06/09/2020   No Known Allergies     Medication List       Accurate as of June 09, 2020  4:07 PM. If you have any questions, ask your nurse or doctor.        amLODipine 10 MG tablet Commonly known as: NORVASC Take 1 tablet (10 mg total) by mouth daily.   atorvastatin 20 MG tablet Commonly known as: LIPITOR Take 1 tablet (20 mg total) by mouth daily.   b complex vitamins capsule Take 1 capsule by mouth daily.   docusate sodium 100 MG capsule Commonly known as: Colace Take 1 capsule (100 mg total) by mouth 2 (two) times daily.   PARoxetine 10 MG tablet Commonly known as: Paxil Take one daily for one week and then increase to 2 daily.    polyethylene glycol 17 g packet Commonly known as: MIRALAX / GLYCOLAX Take 17 g by mouth daily as needed for moderate constipation.   tamsulosin 0.4 MG Caps capsule Commonly known as: FLOMAX Take 2 capsules (0.8 mg total) by mouth daily after supper.   vitamin B-12 1000 MCG tablet Commonly known as: CYANOCOBALAMIN Take 1 tablet (1,000 mcg total) by mouth daily.   vitamin E 180 MG (400 UNITS) capsule Generic drug: vitamin E Take 400 Units by mouth daily.       Allergies: No Known Allergies  Past Medical History:  Diagnosis Date  . Hemorrhoids   . History of colon polyps   . HLD (hyperlipidemia)   . Hyperplasia of prostate with lower urinary tract symptoms (LUTS)   . Hypertension   . Prostate cancer [facility]) urologist-- dr bell/  oncologist-- dr Tammi Klippel   dx 11-23-2018  Stage T1c,  3+4  . Thyroid nodule    08-03-2018  pt pcp note in epic, left side, ultasound ordered   . Wears glasses     Past Surgical History:  Procedure Laterality Date  . COLONOSCOPY    . CYSTOSCOPY N/A 02/04/2019   Procedure: CYSTOSCOPY;  Surgeon: Lucas Mallow, MD;  Location: [facility];  Service: Urology;  Laterality: N/A;  no seeds seen per Dr Gloriann Loan  . [place]  repair left lower arm complex laceration   . PROSTATE BIOPSY  11-23-2018   at dr Gloriann Loan office  . RADIOACTIVE SEED IMPLANT N/A 02/04/2019   Procedure: RADIOACTIVE SEED IMPLANT/BRACHYTHERAPY IMPLANT;  Surgeon: Lucas Mallow, MD;  Location: [facility];  Service: Urology;  Laterality: N/A;  90 seeds  . SPACE OAR INSTILLATION N/A 02/04/2019   Procedure: SPACE OAR INSTILLATION;  Surgeon: Lucas Mallow, MD;  Location: [facility];  Service: Urology;  Laterality: N/A;    Family History  Adopted: Yes  Problem Relation Age of Onset  . Thyroid disease Neg Hx     Social History:  reports that he has never smoked. He has never used smokeless tobacco. He reports current  alcohol use of about 21.0 standard drinks of alcohol per week. He reports that he does not use drugs.   Review of Systems  Constitutional:       His weight has been fluctuating, previously losing weight: 06/09/20 : 205 lb 6.4 oz (93.2 kg) 06/02/20 : 218 lb (98.9 kg) 05/01/20 : 199 lb 3.2 oz (90.4 kg)   HENT: Negative for trouble swallowing.   Respiratory: Negative for shortness of breath.   Endocrine: Negative for fatigue.  Genitourinary: Positive for nocturia.   No history of diabetes  He has history of hypercholesterolemia with last LDL 86   Examination:   BP 112/78   Pulse 81   Ht 5\' 9"  (1.753 m)   Wt 205 lb 6.4 oz (93.2 kg)   SpO2 96%   BMI 30.33 kg/m    General Appearance:  Averagely built and nourished, pleasant, in no distress         Eyes: No prominence or swelling.           Thyroid exam: Slightly firm right lobe palpable medially, nodular and about 1-1/2-2 times normal  Has a 2 cm very firm nodule in the left side of the isthmus, lateral part of the thyroid is not palpable   There is no lymphadenopathy.     Cardiovascular: Normal  heart sounds, no murmur Respiratory:  Lungs clear  Neurological: REFLEXES: at biceps are difficult to elicit .  Skin: no rash        Assessment/Plan:  Multinodular goiter with normal thyroid functions  Diagnosis was made on incidental routine exam  He has a very firm nodule in the isthmus with a TI-RADS score of 6 Features of lobulated/irregular margins are of concern He will need to have a needle aspiration biopsy done of this and the other right-sided nodule  Discussed nature of thyroid nodules, showed him an anatomical module of the thyroid and the nodules Also given information on thyroid nodules and biopsy procedure Explained to him that if there is an indeterminate result on the thyroid cytology we may have to do molecular testing for confirmation  Follow-up to be decided  A copy of the consultation note is  sent to the referring physician  [name] 06/09/2020

## 2020-06-09 NOTE — Telephone Encounter (Signed)
Pt called to see if medications were ordered last visit and it does show 2 medications were sent to [facility] on 06/02/20, Advised pt to contact pharmacy

## 2020-06-10 IMAGING — DX CHEST - 2 VIEW
2 series · 2 of 2 positions shown · non-contrast
Comparison: None.

CLINICAL DATA: preop for RADIOACTIVE SEED IMPLANT/BRACHYTHERAPY
IMPLANT on 02/04/2019 due to prostate cancer. Pt taking HTN meds. Not
diabetic. Nonsmoker. Not asthmatic. No recent chest complaints.

EXAM:
CHEST - 2 VIEW

[chest pa]
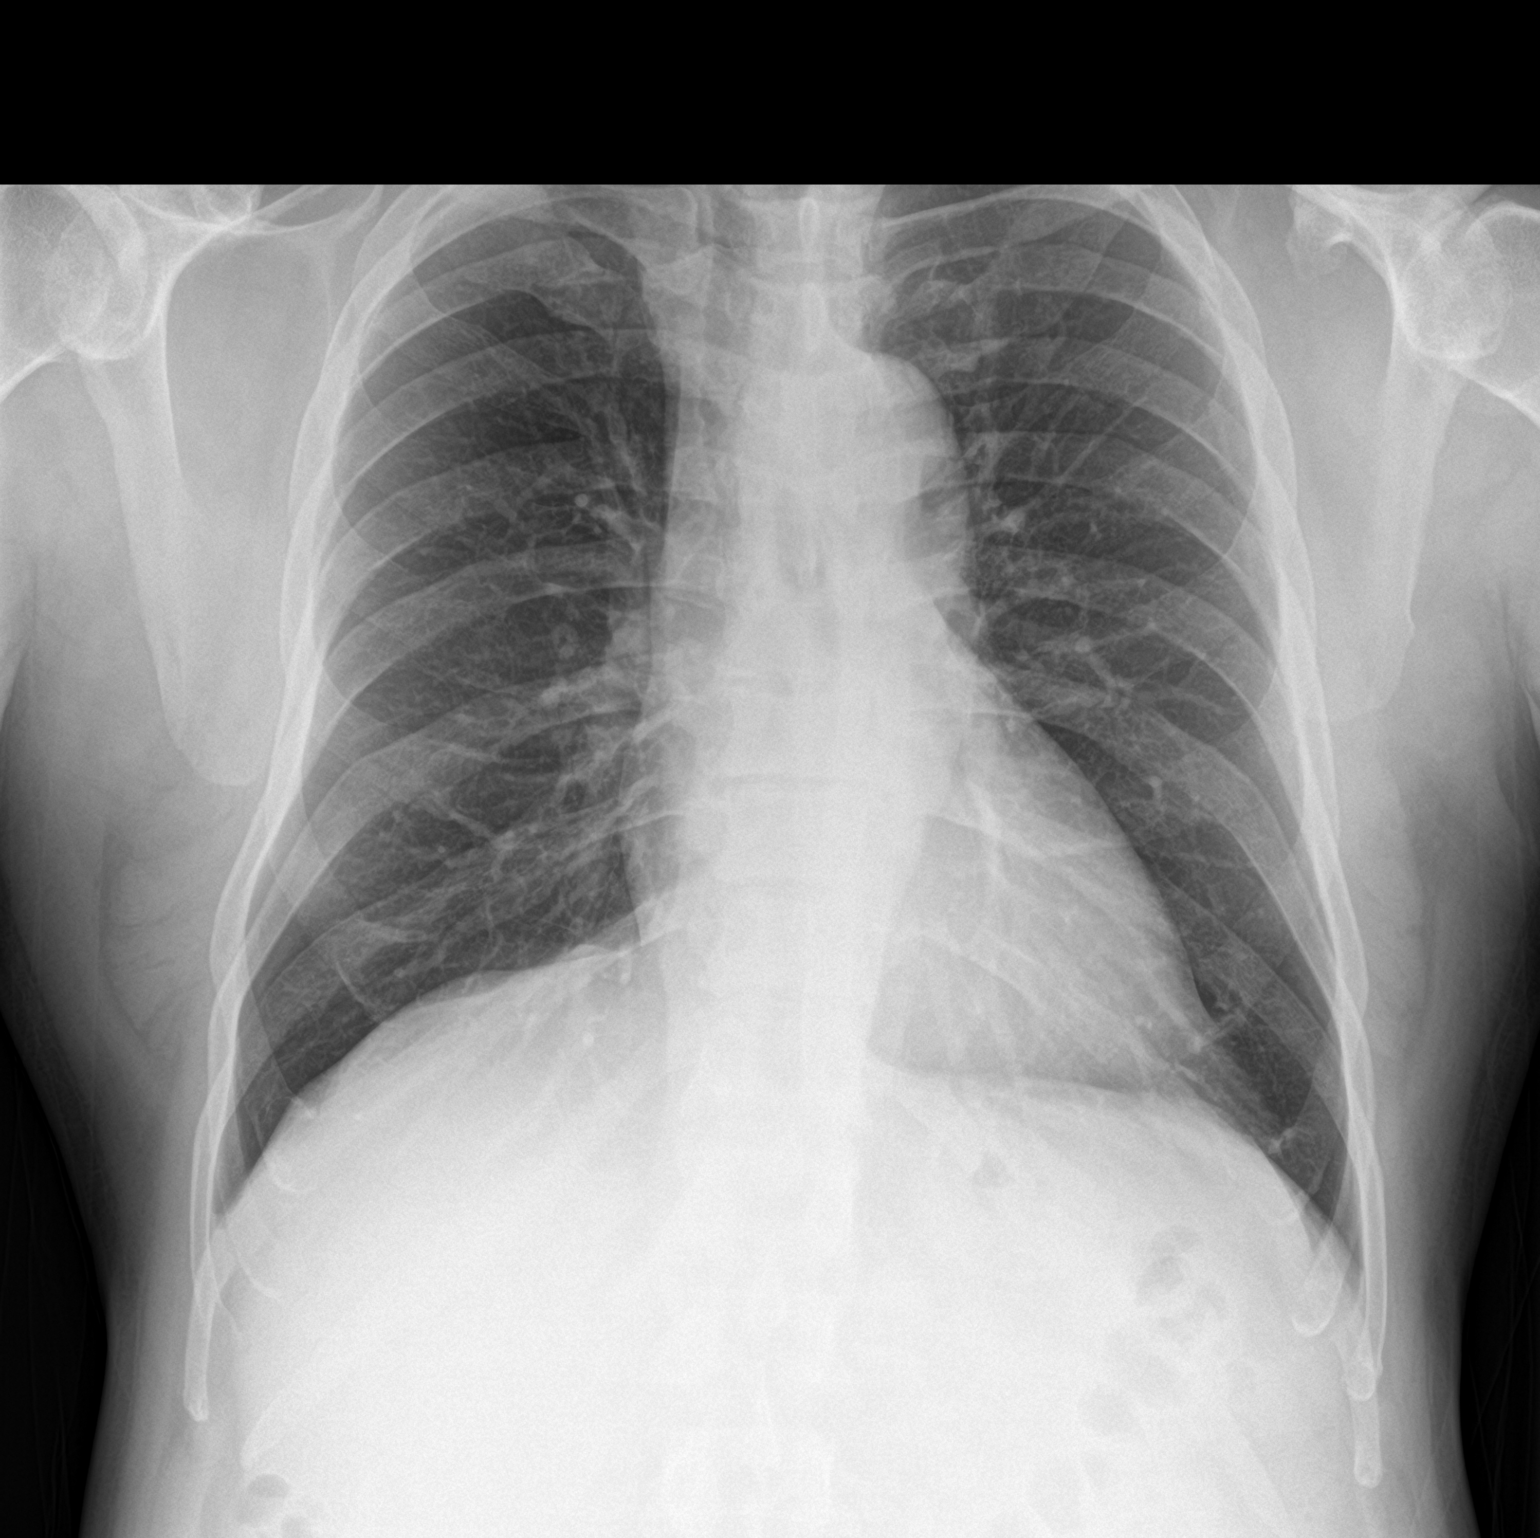

[chest lat]
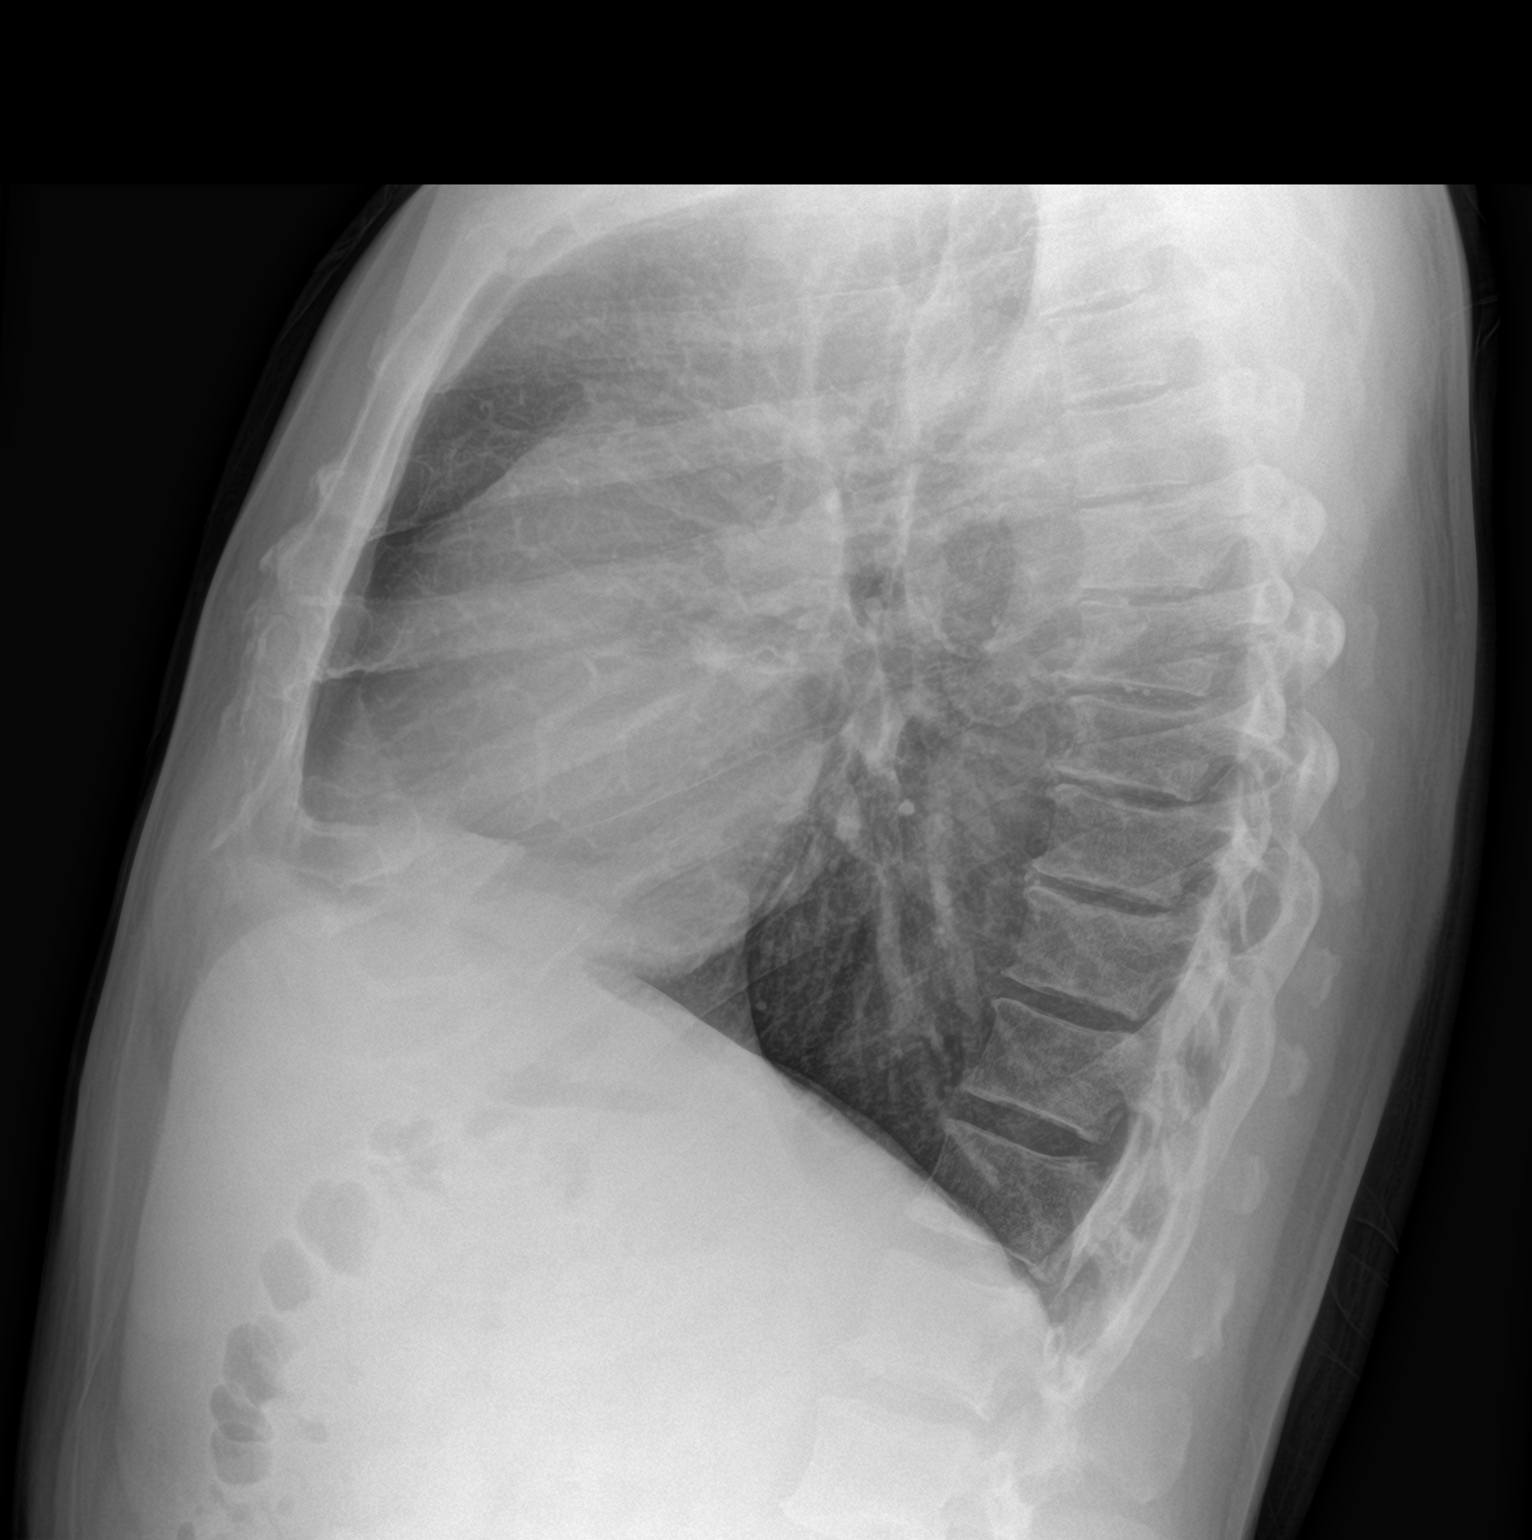

[2 of 2 positions shown; findings below may reference images not displayed]

FINDINGS: Cardiac silhouette is normal in size. No mediastinal or hilar
masses. No evidence of adenopathy.

Clear lungs.  No pleural effusion or pneumothorax.

Skeletal structures are intact.
IMPRESSION: No active cardiopulmonary disease.

## 2020-06-22 ENCOUNTER — Telehealth: Payer: Self-pay | Admitting: Family Medicine

## 2020-06-22 ENCOUNTER — Encounter: Payer: Self-pay | Admitting: Family Medicine

## 2020-06-22 NOTE — Telephone Encounter (Signed)
Pt was no show for appt 06/01/2020. Pt called and completed video visit 06/02/2020. First occurrence. Fee waived. Letter mailed.

## 2020-06-23 ENCOUNTER — Other Ambulatory Visit: Payer: Medicare HMO

## 2020-06-29 ENCOUNTER — Telehealth: Payer: Self-pay

## 2020-06-29 DIAGNOSIS — I10 Essential (primary) hypertension: Secondary | ICD-10-CM

## 2020-06-29 NOTE — Progress Notes (Signed)
Chronic Care Management Pharmacy Assistant   Name: Zakariye Nee  MRN: [id number] DOB: [birth year]  Reason for Encounter:Hypertension  Disease State Call.  PCP : Libby Maw, MD  Allergies:  No Known Allergies  Medications: Outpatient Encounter Medications as of 06/29/2020  Medication Sig  . amLODipine (NORVASC) 10 MG tablet Take 1 tablet (10 mg total) by mouth daily.  Marland Kitchen atorvastatin (LIPITOR) 20 MG tablet Take 1 tablet (20 mg total) by mouth daily.  Marland Kitchen b complex vitamins capsule Take 1 capsule by mouth daily.  Marland Kitchen docusate sodium (COLACE) 100 MG capsule Take 1 capsule (100 mg total) by mouth 2 (two) times daily. (Patient not taking: Reported on 05/01/2020)  . PARoxetine (PAXIL) 10 MG tablet Take one daily for one week and then increase to 2 daily.  . polyethylene glycol (MIRALAX / GLYCOLAX) 17 g packet Take 17 g by mouth daily as needed for moderate constipation.  . tamsulosin (FLOMAX) 0.4 MG CAPS capsule Take 2 capsules (0.8 mg total) by mouth daily after supper.  . vitamin B-12 (CYANOCOBALAMIN) 1000 MCG tablet Take 1 tablet (1,000 mcg total) by mouth daily.  . vitamin E (VITAMIN E) 180 MG (400 UNITS) capsule Take 400 Units by mouth daily.   No facility-administered encounter medications on file as of 06/29/2020.    Current Diagnosis: Patient Active Problem List   Diagnosis Date Noted  . Depression, major, single episode, moderate ([place]) 06/02/2020  . Poor appetite 05/01/2020  . Weight loss 05/01/2020  . Depression with anxiety 05/01/2020  . Rectal bleeding 01/28/2020  . Slow transit constipation 01/28/2020  . B12 deficiency 10/07/2019  . Anemia 10/03/2019  . Alcohol use 03/14/2019  . Polyp of colon 03/14/2019  . Midline low back pain without sciatica 03/14/2019  . Non-compliant patient 03/14/2019  . Malignant neoplasm of prostate ([place]) 12/11/2018  . Elevated LDL cholesterol level 08/31/2018  . Elevated PSA, less than 10 ng/ml 08/31/2018  . Essential  hypertension 08/31/2018  . Thyroid mass 08/03/2018  . Healthcare maintenance 08/03/2018  . Benign prostatic hyperplasia with urinary hesitancy 08/03/2018     Follow-Up:  Pharmacist Review   Reviewed chart prior to disease state call. Spoke with patient regarding BP  Recent Office Vitals: BP Readings from Last 3 Encounters:  06/09/20 112/78  05/01/20 124/80  02/06/20 124/80   Pulse Readings from Last 3 Encounters:  06/09/20 81  05/01/20 83  02/06/20 81    Wt Readings from Last 3 Encounters:  06/09/20 205 lb 6.4 oz (93.2 kg)  06/02/20 218 lb (98.9 kg)  05/01/20 199 lb 3.2 oz (90.4 kg)     Kidney Function Lab Results  Component Value Date/Time   CREATININE 1.24 06/03/2020 11:29 AM   CREATININE 1.36 01/28/2020 04:11 PM   CREATININE 1.35 (H) 08/03/2018 02:19 PM   GFR 59.15 (L) 06/03/2020 11:29 AM   GFRNONAA 52 (L) 01/31/2019 10:09 AM   GFRAA 60 (L) 01/31/2019 10:09 AM    BMP Latest Ref Rng & Units 06/03/2020 01/28/2020 10/03/2019  Glucose 70 - 99 mg/dL 96 105(H) 91  BUN 6 - 23 mg/dL 17 14 15   Creatinine 0.40 - 1.50 mg/dL 1.24 1.36 1.38  BUN/Creat Ratio 6 - 22 (calc) - - -  Sodium 135 - 145 mEq/L 138 141 141  Potassium 3.5 - 5.1 mEq/L 4.0 4.1 4.1  Chloride 96 - 112 mEq/L 104 104 105  CO2 19 - 32 mEq/L 26 29 27   Calcium 8.4 - 10.5 mg/dL 9.8 9.6 9.8    .  Current antihypertensive regimen:  ? Amlodipine 10 mg daily breakfast . How often are you checking your Blood Pressure? Patient states he does not check his blood pressure at home. . Current home BP readings: None ID . What recent interventions/DTPs have been made by any provider to improve Blood Pressure control since last CPP Visit: None ID  . Any recent hospitalizations or ED visits since last visit with CPP? No . What diet changes have been made to improve Blood Pressure Control?  o Patient states he cooks at home without using salt. . What exercise is being done to improve your Blood Pressure Control?  o Patient  states he exercise at work.  Adherence Review: Is the patient currently on ACE/ARB medication? No Does the patient have >5 day gap between last estimated fill dates? Yes   [place] Pharmacist Assistant [phone]

## 2020-07-21 ENCOUNTER — Telehealth: Payer: Self-pay

## 2020-07-21 NOTE — Progress Notes (Signed)
    Chronic Care Management Pharmacy Assistant   Name: Logan Orr  MRN: [id number] DOB: Dec 26, 1950  Reason for Encounter: Medication Review  PCP : Libby Maw, MD  Allergies:  No Known Allergies  Medications: Outpatient Encounter Medications as of 07/21/2020  Medication Sig  . amLODipine (NORVASC) 10 MG tablet Take 1 tablet (10 mg total) by mouth daily.  Marland Kitchen atorvastatin (LIPITOR) 20 MG tablet Take 1 tablet (20 mg total) by mouth daily.  Marland Kitchen b complex vitamins capsule Take 1 capsule by mouth daily.  Marland Kitchen docusate sodium (COLACE) 100 MG capsule Take 1 capsule (100 mg total) by mouth 2 (two) times daily. (Patient not taking: Reported on 05/01/2020)  . PARoxetine (PAXIL) 10 MG tablet Take one daily for one week and then increase to 2 daily.  . polyethylene glycol (MIRALAX / GLYCOLAX) 17 g packet Take 17 g by mouth daily as needed for moderate constipation.  . tamsulosin (FLOMAX) 0.4 MG CAPS capsule Take 2 capsules (0.8 mg total) by mouth daily after supper.  . vitamin B-12 (CYANOCOBALAMIN) 1000 MCG tablet Take 1 tablet (1,000 mcg total) by mouth daily.  . vitamin E (VITAMIN E) 180 MG (400 UNITS) capsule Take 400 Units by mouth daily.   No facility-administered encounter medications on file as of 07/21/2020.    Current Diagnosis: Patient Active Problem List   Diagnosis Date Noted  . Depression, major, single episode, moderate ([place]) 06/02/2020  . Poor appetite 05/01/2020  . Weight loss 05/01/2020  . Depression with anxiety 05/01/2020  . Rectal bleeding 01/28/2020  . Slow transit constipation 01/28/2020  . B12 deficiency 10/07/2019  . Anemia 10/03/2019  . Alcohol use 03/14/2019  . Polyp of colon 03/14/2019  . Midline low back pain without sciatica 03/14/2019  . Non-compliant patient 03/14/2019  . Malignant neoplasm of prostate ([place]) 12/11/2018  . Elevated LDL cholesterol level 08/31/2018  . Elevated PSA, less than 10 ng/ml 08/31/2018  . Essential hypertension 08/31/2018    . Thyroid mass 08/03/2018  . Healthcare maintenance 08/03/2018  . Benign prostatic hyperplasia with urinary hesitancy 08/03/2018     Follow-Up:  Pharmacist Review   Performed cost analysis for patient, estimated yearly medication cost of $0.00 with [place].  [place] Pharmacist Assistant [phone]

## 2020-08-27 ENCOUNTER — Other Ambulatory Visit: Payer: Medicare HMO

## 2020-08-27 DIAGNOSIS — N401 Enlarged prostate with lower urinary tract symptoms: Secondary | ICD-10-CM | POA: Diagnosis not present

## 2020-08-27 DIAGNOSIS — R351 Nocturia: Secondary | ICD-10-CM | POA: Diagnosis not present

## 2020-08-27 DIAGNOSIS — N5201 Erectile dysfunction due to arterial insufficiency: Secondary | ICD-10-CM | POA: Diagnosis not present

## 2020-08-27 DIAGNOSIS — C61 Malignant neoplasm of prostate: Secondary | ICD-10-CM | POA: Diagnosis not present

## 2020-09-01 ENCOUNTER — Other Ambulatory Visit: Payer: Medicare HMO

## 2020-09-02 ENCOUNTER — Telehealth: Payer: Self-pay

## 2020-09-02 NOTE — Progress Notes (Signed)
Spoke to patient to confirmed patient telephone appointment on 09/03/2020 for CCM at 8:00 am with Junius Argyle the Clinical pharmacist.   Patient verbalized understanding.  [place] Pharmacist Assistant [phone]

## 2020-09-03 ENCOUNTER — Telehealth: Payer: Medicare HMO

## 2020-09-08 ENCOUNTER — Other Ambulatory Visit: Payer: Medicare HMO

## 2020-09-09 ENCOUNTER — Other Ambulatory Visit: Payer: Self-pay | Admitting: Family Medicine

## 2020-09-09 ENCOUNTER — Ambulatory Visit
Admission: RE | Admit: 2020-09-09 | Discharge: 2020-09-09 | Disposition: A | Discharge status: Home / Self Care | Payer: Medicare HMO | Source: Ambulatory Visit | Attending: Endocrinology | Admitting: Endocrinology

## 2020-09-09 ENCOUNTER — Other Ambulatory Visit (HOSPITAL_COMMUNITY)
Admission: RE | Admit: 2020-09-09 | Discharge: 2020-09-09 | Disposition: A | Discharge status: Home / Self Care | Payer: Medicare HMO | Source: Ambulatory Visit | Attending: Endocrinology | Admitting: Endocrinology

## 2020-09-09 ENCOUNTER — Other Ambulatory Visit: Payer: Self-pay

## 2020-09-09 DIAGNOSIS — D440 Neoplasm of uncertain behavior of thyroid gland: Secondary | ICD-10-CM | POA: Diagnosis not present

## 2020-09-09 DIAGNOSIS — E041 Nontoxic single thyroid nodule: Secondary | ICD-10-CM | POA: Diagnosis not present

## 2020-09-09 DIAGNOSIS — E042 Nontoxic multinodular goiter: Secondary | ICD-10-CM | POA: Diagnosis not present

## 2020-09-09 DIAGNOSIS — F321 Major depressive disorder, single episode, moderate: Secondary | ICD-10-CM

## 2020-09-10 LAB — CYTOLOGY - NON PAP

## 2020-09-10 NOTE — Telephone Encounter (Signed)
Refill request for pending medication last OV and refill 06/02/20. Please advise.

## 2020-09-11 ENCOUNTER — Telehealth: Payer: Self-pay | Admitting: Endocrinology

## 2020-09-11 NOTE — Telephone Encounter (Signed)
I asked Vaughan Basta to call him and schedule the appointment.

## 2020-09-11 NOTE — Telephone Encounter (Signed)
His thyroid biopsy result is suspicious, would like to have him come in for reexam and discussion

## 2020-09-14 ENCOUNTER — Other Ambulatory Visit: Payer: Self-pay

## 2020-09-14 NOTE — Telephone Encounter (Signed)
Please schedule follow-up visit ASAP for discussion on biopsy result

## 2020-09-15 ENCOUNTER — Ambulatory Visit
Admission: RE | Admit: 2020-09-15 | Discharge: 2020-09-15 | Disposition: A | Discharge status: Home / Self Care | Payer: Medicare HMO | Source: Ambulatory Visit | Attending: Endocrinology | Admitting: Endocrinology

## 2020-09-15 ENCOUNTER — Ambulatory Visit (INDEPENDENT_AMBULATORY_CARE_PROVIDER_SITE_OTHER): Payer: Medicare HMO | Admitting: Endocrinology

## 2020-09-15 ENCOUNTER — Encounter: Payer: Self-pay | Admitting: Endocrinology

## 2020-09-15 VITALS — BP 122/72 | HR 95 | Ht 69.0 in | Wt 220.4 lb

## 2020-09-15 DIAGNOSIS — E041 Nontoxic single thyroid nodule: Secondary | ICD-10-CM

## 2020-09-15 DIAGNOSIS — I10 Essential (primary) hypertension: Secondary | ICD-10-CM | POA: Diagnosis not present

## 2020-09-15 NOTE — Progress Notes (Signed)
Patient ID: Logan Orr, male   DOB: 09-06-1950, 70 y.o.   MRN: [id number]          Reason for Appointment: Review biopsy report    History of Present Illness:     The patient's thyroid enlargement was first discovered in 04/2020 on a routine exam by his PCP Thyroid ultrasound showed a 2 cm left thyroid nodule with TI-RADS 6 score based on irregular margins  His thyroid biopsy was finally done on 09/09/2020 and showed Bethesda 5 cytology suspicious for malignancy although scant cellularity reported  Patient does not feel any swelling in the night, local pressure or difficulty swallowing, no hoarseness  His thyroid level has been normal  Lab Results  Component Value Date   TSH 0.72 06/03/2020   TSH 0.39 03/14/2019   TSH 0.43 08/03/2018    He has had an ultrasound exam in 04/2020 which showed   Borderline thyromegaly with findings suggestive of multinodular goiter.  Nodule #1 location: Isthmus; Mid  Maximum size: 2.0 cm; Other 2 dimensions: 1.6 x 1.5 cm  Composition: solid/almost completely solid (2)  Echogenicity: hypoechoic (2)  Shape: not taller-than-wide (0)  Margins: lobulated/irregular (2)   Echogenic foci: none (0)  ACR TI-RADS total points: 6.  2. Nodules #1 and #4 both meet imaging criteria to recommend percutaneous sampling as indicated. 3. Nodule #5 meets imaging criteria to recommend a 1 year follow-up.    Allergies as of 09/15/2020   No Known Allergies     Medication List       Accurate as of September 15, 2020  1:34 PM. If you have any questions, ask your nurse or doctor.        amLODipine 10 MG tablet Commonly known as: NORVASC Take 1 tablet (10 mg total) by mouth daily.   atorvastatin 20 MG tablet Commonly known as: LIPITOR Take 1 tablet (20 mg total) by mouth daily.   b complex vitamins capsule Take 1 capsule by mouth daily.   docusate sodium 100 MG capsule Commonly known as: Colace Take 1 capsule (100 mg total) by mouth 2 (two)  times daily.   PARoxetine 20 MG tablet Commonly known as: PAXIL Take 1 tablet (20 mg total) by mouth daily.   polyethylene glycol 17 g packet Commonly known as: MIRALAX / GLYCOLAX Take 17 g by mouth daily as needed for moderate constipation.   tamsulosin 0.4 MG Caps capsule Commonly known as: FLOMAX Take 2 capsules (0.8 mg total) by mouth daily after supper.   vitamin B-12 1000 MCG tablet Commonly known as: CYANOCOBALAMIN Take 1 tablet (1,000 mcg total) by mouth daily.   vitamin E 180 MG (400 UNITS) capsule Take 400 Units by mouth daily.       Allergies: No Known Allergies  Past Medical History:  Diagnosis Date  . Hemorrhoids   . History of colon polyps   . HLD (hyperlipidemia)   . Hyperplasia of prostate with lower urinary tract symptoms (LUTS)   . Hypertension   . Prostate cancer [facility]) urologist-- dr bell/  oncologist-- dr Tammi Klippel   dx 11-23-2018  Stage T1c,  3+4  . Thyroid nodule    08-03-2018  pt pcp note in epic, left side, ultasound ordered   . Wears glasses     Past Surgical History:  Procedure Laterality Date  . COLONOSCOPY    . CYSTOSCOPY N/A 02/04/2019   Procedure: CYSTOSCOPY;  Surgeon: Lucas Mallow, MD;  Location: [facility];  Service: Urology;  Laterality: N/A;  no seeds seen per Dr Gloriann Loan  . LACERATION REPAIR  1990s   repair left lower arm complex laceration   . PROSTATE BIOPSY  11-23-2018   at dr Gloriann Loan office  . RADIOACTIVE SEED IMPLANT N/A 02/04/2019   Procedure: RADIOACTIVE SEED IMPLANT/BRACHYTHERAPY IMPLANT;  Surgeon: Lucas Mallow, MD;  Location: [facility];  Service: Urology;  Laterality: N/A;  90 seeds  . SPACE OAR INSTILLATION N/A 02/04/2019   Procedure: SPACE OAR INSTILLATION;  Surgeon: Lucas Mallow, MD;  Location[facility];  Service: Urology;  Laterality: N/A;    Family History  Adopted: Yes  Problem Relation Age of Onset  . Thyroid disease Neg Hx     Social History:   reports that he has never smoked. He has never used smokeless tobacco. He reports current alcohol use of about 21.0 standard drinks of alcohol per week. He reports that he does not use drugs.   Review of Systems  No history of diabetes  BP Readings from Last 3 Encounters:  09/15/20 122/72  06/09/20 112/78  05/01/20 124/80      Examination:   BP 122/72   Pulse 95   Ht 5\' 9"  (1.753 m)   Wt 220 lb 6.4 oz (100 kg)   SpO2 97%   BMI 32.55 kg/m    Thyroid exam: Right lobe there is just palpable, slightly firm and smooth  Has a 2 cm very firm nodule in the left side of the isthmus without any lateral thyroid enlargement No lymph nodes palpable   Assessment/Plan:  Probable papillary thyroid cancer with Bethesda 5 cytopathology Since his clinical exam and ultrasound also indicate likely malignancy he will be referred for surgery Chest x-ray also to be done today  Explained to the patient the location of the thyroid, natural history of the thyroid cancers, likely need for partial or complete thyroidectomy and potentially radioactive iodine treatment postoperatively depending on surgical findings Also explained that he will need to be on thyroid supplementation postoperatively; based on his weight he will likely need at least 0.15 mg of levothyroxine  Discussed small risks of parathyroid injury or laryngeal nerve injury from surgery  Referral to Dr. Harlow Asa has been made  [name] 09/15/2020

## 2020-09-23 ENCOUNTER — Telehealth: Payer: Self-pay | Admitting: Endocrinology

## 2020-09-23 NOTE — Telephone Encounter (Signed)
Did you order an Xray for him?

## 2020-09-23 NOTE — Telephone Encounter (Signed)
Patient requests to be called at ph# [phone] re: Patient had an X-Ray after last visit and is waiting to hear from our office re: next steps

## 2020-09-23 NOTE — Telephone Encounter (Signed)
Chest x-ray was normal.  He is supposed to be getting scheduled with Dr. Harlow Asa at [facility] surgery.  Please send a message to Pietro Cassis to follow-up about this consultation request

## 2020-10-16 ENCOUNTER — Ambulatory Visit: Payer: Medicare HMO | Admitting: Family Medicine

## 2020-10-16 NOTE — Telephone Encounter (Signed)
Patient should have been called by Dr Gala Lewandowsky office - he is scheduled on 11/19/20 with him. I did call patient yesterday to inform him he is welcome to give Dr Gala Lewandowsky office a call regarding that appointment information, but they should have contacted him. (I had to leave a voicemail)

## 2020-10-20 NOTE — Telephone Encounter (Signed)
Dr Gala Lewandowsky office called again, stating they were able to fit patient in for March 9th at 11:00 am. Patient was called to be informed, but they had to leave another voicemail. FYI

## 2020-10-20 NOTE — Telephone Encounter (Signed)
Called Dr Gala Lewandowsky office to see if patient could be seen sooner, they do have an opening a few days sooner, so they moved patient to March 28th. She said she would send his nurse a message informing her of Dr Ronnie Derby request. Patient is on wait list for sooner appointment, and she will let me know if she can get him in sooner. She asked if it is ok for patient to see another Dr in case someone else has sooner availability? Please advise.

## 2020-10-20 NOTE — Telephone Encounter (Signed)
Dr Gala Lewandowsky office called me back stating they were able to get patient in on March 18th with Dr Harlow Asa. They did call patient to inform him but had to LVM. FYI

## 2020-11-19 ENCOUNTER — Other Ambulatory Visit: Payer: Self-pay | Admitting: Family Medicine

## 2020-11-19 ENCOUNTER — Ambulatory Visit: Payer: Medicare HMO | Admitting: Endocrinology

## 2020-11-19 DIAGNOSIS — I10 Essential (primary) hypertension: Secondary | ICD-10-CM

## 2020-12-23 ENCOUNTER — Ambulatory Visit: Payer: Self-pay | Admitting: Surgery

## 2020-12-23 ENCOUNTER — Telehealth: Payer: Self-pay

## 2020-12-23 DIAGNOSIS — D440 Neoplasm of uncertain behavior of thyroid gland: Secondary | ICD-10-CM | POA: Diagnosis not present

## 2020-12-23 DIAGNOSIS — E042 Nontoxic multinodular goiter: Secondary | ICD-10-CM | POA: Diagnosis not present

## 2020-12-23 NOTE — Progress Notes (Signed)
    Chronic Care Management Pharmacy Assistant   Name: Logan Orr  MRN: [id number] DOB: Nov 14, 1950  Reason for Encounter: Medication Review/General Adherence Call.   Recent office visits:  No recent Office Visit  Recent consult visits:  09/15/2020 Dr. Dwyane Dee MD (Endocrinology)   Hospital visits:  None in previous 6 months  Medications: Outpatient Encounter Medications as of 12/23/2020  Medication Sig  . amLODipine (NORVASC) 10 MG tablet TAKE 1 TABLET EVERY DAY  . atorvastatin (LIPITOR) 20 MG tablet Take 1 tablet (20 mg total) by mouth daily.  Marland Kitchen b complex vitamins capsule Take 1 capsule by mouth daily.  Marland Kitchen docusate sodium (COLACE) 100 MG capsule Take 1 capsule (100 mg total) by mouth 2 (two) times daily.  Marland Kitchen PARoxetine (PAXIL) 20 MG tablet Take 1 tablet (20 mg total) by mouth daily.  . polyethylene glycol (MIRALAX / GLYCOLAX) 17 g packet Take 17 g by mouth daily as needed for moderate constipation.  . tamsulosin (FLOMAX) 0.4 MG CAPS capsule Take 2 capsules (0.8 mg total) by mouth daily after supper.  . vitamin B-12 (CYANOCOBALAMIN) 1000 MCG tablet Take 1 tablet (1,000 mcg total) by mouth daily.  . vitamin E 180 MG (400 UNITS) capsule Take 400 Units by mouth daily. (Patient not taking: Reported on 09/15/2020)   No facility-administered encounter medications on file as of 12/23/2020.    Star Rating Drugs: Atorvastatin 20 mg last filled on 05/19/2020 for 90 day supply at [facility].  Called patient and discussed medication adherence  with patient, no issues at this time with current medication.   Patient denies ED visit since his last CPP follow up.  Patient denies any side effects with his medication. Patient denies any problems with his current pharmacy     [name] Pharmacist Assistant [phone]

## 2020-12-27 ENCOUNTER — Encounter (HOSPITAL_COMMUNITY): Payer: Self-pay | Admitting: Surgery

## 2020-12-27 DIAGNOSIS — E042 Nontoxic multinodular goiter: Secondary | ICD-10-CM | POA: Diagnosis present

## 2020-12-27 NOTE — H&P (Signed)
General Surgery [facility]  Zakariya Knickerbocker Gerding DOB: [birth year] Single / Language: English / Race: Black or African American Male   History of Present Illness  The patient is a 70 year old male who presents with thyroid cancer.  CHIEF COMPLAINT: thyroid neoplasm of uncertain behavior, multiple thyroid nodules  Patient is referred by Dr. [name] for surgical evaluation and management of thyroid neoplasm of uncertain behavior suspicious for thyroid carcinoma. Patient's primary care physician is Dr. Samule Ohm. Patient was noted on routine physical examination by his primary care physician to have thyroid nodules. He was referred for a thyroid ultrasound which was performed on May 04, 2020. This demonstrated multiple thyroid nodules involving both thyroid lobes. There was a dominant nodule in the thyroid isthmus measuring 2.0 cm in greatest dimension. It appeared lobulated and irregular. Biopsy was recommended. Fine-needle aspiration biopsy was performed on September 09, 2020. Cytopathology returned as suspicious for malignancy, Bethesda category V. Patient is now referred for consideration for surgical resection for definitive diagnosis and management. Patient has no prior history of thyroid disease. He has never been on thyroid medication. He has had no prior head or neck surgery. Patient is adopted so family history is not available. Patient works as a Comptroller.   Past Surgical History  Prostate Surgery - Removal   Diagnostic Studies History  Colonoscopy  5-10 years ago  Allergies  No Known Drug Allergies  Allergies Reconciled   Medication History  amLODIPine Besylate (10MG  Tablet, Oral) Active. Atorvastatin Calcium (40MG  Tablet, Oral) Active. Atorvastatin Calcium (20MG  Tablet, Oral) Active. Vitamin B-12 (1000MCG Tablet, Oral) Active. PARoxetine HCl (10MG  Tablet, Oral) Active. PARoxetine HCl (20MG  Tablet,  Oral) Active. Tamsulosin HCl (0.4MG  Capsule, Oral) Active. Medications Reconciled  Social History  Alcohol use  Moderate alcohol use. No caffeine use  No drug use  Tobacco use  Never smoker.  Other Problems  High blood pressure  Hypercholesterolemia   Review of Systems  General Not Present- Appetite Loss, Chills, Fatigue, Fever, Night Sweats, Weight Gain and Weight Loss. Skin Not Present- Change in Wart/Mole, Dryness, Hives, Jaundice, New Lesions, Non-Healing Wounds, Rash and Ulcer. HEENT Not Present- Earache, Hearing Loss, Hoarseness, Nose Bleed, Oral Ulcers, Ringing in the Ears, Seasonal Allergies, Sinus Pain, Sore Throat, Visual Disturbances, Wears glasses/contact lenses and Yellow Eyes. Respiratory Not Present- Bloody sputum, Chronic Cough, Difficulty Breathing, Snoring and Wheezing. Breast Not Present- Breast Mass, Breast Pain, Nipple Discharge and Skin Changes. Cardiovascular Not Present- Chest Pain, Difficulty Breathing Lying Down, Leg Cramps, Palpitations, Rapid Heart Rate, Shortness of Breath and Swelling of Extremities. Gastrointestinal Not Present- Abdominal Pain, Bloating, Bloody Stool, Change in Bowel Habits, Chronic diarrhea, Constipation, Difficulty Swallowing, Excessive gas, Gets full quickly at meals, Hemorrhoids, Indigestion, Nausea, Rectal Pain and Vomiting. Male Genitourinary Not Present- Blood in Urine, Change in Urinary Stream, Frequency, Impotence, Nocturia, Painful Urination, Urgency and Urine Leakage. Musculoskeletal Not Present- Back Pain, Joint Pain, Joint Stiffness, Muscle Pain, Muscle Weakness and Swelling of Extremities. Neurological Not Present- Decreased Memory, Fainting, Headaches, Numbness, Seizures, Tingling, Tremor, Trouble walking and Weakness. Psychiatric Not Present- Anxiety, Bipolar, Change in Sleep Pattern, Depression, Fearful and Frequent crying. Endocrine Not Present- Cold Intolerance, Excessive Hunger, Hair Changes, Heat Intolerance, Hot  flashes and New Diabetes. Hematology Not Present- Blood Thinners, Easy Bruising, Excessive bleeding, Gland problems, HIV and Persistent Infections.  Vitals  Weight: 214 lb Height: 66in Body Surface Area: 2.06 m Body Mass Index: 34.54 kg/m  Temp.: 68F  Pulse: 94 (  Regular)  P.OX: 99% (Room air) BP: 140/70(Sitting, Left Arm, Standard)  Physical Exam   GENERAL APPEARANCE Development: normal Nutritional status: normal Gross deformities: none  SKIN Rash, lesions, ulcers: none Induration, erythema: none Nodules: none palpable  EYES Conjunctiva and lids: normal Pupils: equal and reactive Iris: normal bilaterally  EARS, NOSE, MOUTH, THROAT External ears: no lesion or deformity External nose: no lesion or deformity Hearing: grossly normal Due to Covid-19 pandemic, patient is wearing a mask.  NECK Symmetric: yes Trachea: midline Thyroid: The right thyroid lobe is normal to palpation without discrete or dominant nodule. There is a palpable nodule just to the left of midline in the low anterior neck measuring approximately 2 cm, moderately firm, mobile, and nontender. The remainder of the left thyroid lobe is without palpable abnormality. There is no palpable lymphadenopathy on either side of the neck or in the supraclavicular fossae.  CHEST Respiratory effort: normal Retraction or accessory muscle use: no Breath sounds: normal bilaterally Rales, rhonchi, wheeze: none  CARDIOVASCULAR Auscultation: regular rhythm, normal rate Murmurs: none Pulses: radial pulse 2+ palpable Lower extremity edema: none  MUSCULOSKELETAL Station and gait: normal Digits and nails: no clubbing or cyanosis Muscle strength: grossly normal all extremities Range of motion: grossly normal all extremities Deformity: none  LYMPHATIC Cervical: none palpable Supraclavicular: none palpable  PSYCHIATRIC Oriented to person, place, and time: yes Mood and affect: normal for  situation Judgment and insight: appropriate for situation    Assessment & Plan   MULTIPLE THYROID NODULES (E04.2)  Patient is referred by his endocrinologist and primary care physician for surgical evaluation and management of newly diagnosed thyroid neoplasm of uncertain behavior with possibility of thyroid carcinoma.  Patient provided with a copy of "The Thyroid Book: Medical and Surgical Treatment of Thyroid Problems", published by [employer], 16 pages. Book reviewed and explained to patient during visit today.  We reviewed his ultrasound report, his clinical history, and his cytopathology report. Because he has bilateral thyroid nodules and there is a high likelihood, about 90% chance, of this being malignant, I recommended proceeding with total thyroidectomy. We discussed the risk and benefits of the procedure including the risk of recurrent laryngeal nerve injury and injury to parathyroid glands. We discussed the size and location of the surgical incision. We discussed the hospital stay to be anticipated. We discussed the need for lifelong thyroid hormone replacement. We discussed the potential need for radioactive iodine treatment. The patient understands and wishes to proceed with surgery in the near future.  The risks and benefits of the procedure have been discussed at length with the patient. The patient understands the proposed procedure, potential alternative treatments, and the course of recovery to be expected. All of the patient's questions have been answered at this time. The patient wishes to proceed with surgery.  Armandina Gemma, MD [facility] Office: [phone]

## 2020-12-29 ENCOUNTER — Telehealth: Payer: Self-pay

## 2020-12-29 NOTE — Progress Notes (Signed)
Left voice message returning patient call.  [place] Pharmacist Assistant [phone]

## 2020-12-31 ENCOUNTER — Telehealth (INDEPENDENT_AMBULATORY_CARE_PROVIDER_SITE_OTHER): Payer: Medicare HMO | Admitting: Family Medicine

## 2020-12-31 ENCOUNTER — Encounter: Payer: Self-pay | Admitting: Family Medicine

## 2020-12-31 VITALS — Ht 69.0 in | Wt 216.0 lb

## 2020-12-31 DIAGNOSIS — Z0000 Encounter for general adult medical examination without abnormal findings: Secondary | ICD-10-CM

## 2020-12-31 NOTE — Progress Notes (Signed)
Established Patient Office Visit  Subjective:  Patient ID: Logan Orr, male    DOB: 1950/10/10  Age: 70 y.o. MRN: [id number]  CC:  Chief Complaint  Patient presents with  . advise    Patient would like to discuss colonoscopy.     HPI [facility] presents for referral for colonoscopy.  Last one was 10 years ago.  He believes that there were polyps taken.  He is not certain.  It was performed somewhere in [place].  He is scheduled for thyroidectomy in 1 week.  We discussed that procedure what to expect.  Asked him how he was doing with the Paxil and he is not certain.  When I reminded him it was for depression he said he was doing okay with it.  He was given 90 days back in October.  Past Medical History:  Diagnosis Date  . Hemorrhoids   . History of colon polyps   . HLD (hyperlipidemia)   . Hyperplasia of prostate with lower urinary tract symptoms (LUTS)   . Hypertension   . Prostate cancer [facility]) urologist-- dr bell/  oncologist-- dr Tammi Klippel   dx 11-23-2018  Stage T1c,  3+4  . Thyroid nodule    08-03-2018  pt pcp note in epic, left side, ultasound ordered   . Wears glasses     Past Surgical History:  Procedure Laterality Date  . COLONOSCOPY    . CYSTOSCOPY N/A 02/04/2019   Procedure: CYSTOSCOPY;  Surgeon: Lucas Mallow, MD;  Location: [facility];  Service: Urology;  Laterality: N/A;  no seeds seen per Dr Gloriann Loan  . LACERATION REPAIR  1990s   repair left lower arm complex laceration   . PROSTATE BIOPSY  11-23-2018   at dr Gloriann Loan office  . RADIOACTIVE SEED IMPLANT N/A 02/04/2019   Procedure: RADIOACTIVE SEED IMPLANT/BRACHYTHERAPY IMPLANT;  Surgeon: Lucas Mallow, MD;  Location: [facility];  Service: Urology;  Laterality: N/A;  90 seeds  . SPACE OAR INSTILLATION N/A 02/04/2019   Procedure: SPACE OAR INSTILLATION;  Surgeon: Lucas Mallow, MD;  Location: [facility];  Service: Urology;  Laterality: N/A;    Family  History  Adopted: Yes  Problem Relation Age of Onset  . Thyroid disease Neg Hx     Social History   Socioeconomic History  . Marital status: Single    Spouse name: Not on file  . Number of children: 1  . Years of education: Not on file  . Highest education level: Not on file  Occupational History    Comment: unemployed   Tobacco Use  . Smoking status: Never Smoker  . Smokeless tobacco: Never Used  Vaping Use  . Vaping Use: Never used  Substance and Sexual Activity  . Alcohol use: Yes    Alcohol/week: 21.0 standard drinks    Types: 21 Cans of beer per week    Comment: 3-4 beers daily  . Drug use: Never  . Sexual activity: Yes  Other Topics Concern  . Not on file  Social History Narrative  . Not on file   Social Determinants of Health   Financial Resource Strain: Medium Risk  . Difficulty of Paying Living Expenses: Somewhat hard  Food Insecurity: Food Insecurity Present  . Worried About Charity fundraiser in the Last Year: Sometimes true  . Ran Out of Food in the Last Year: Sometimes true  Transportation Needs: No Transportation Needs  . Lack of Transportation (Medical): No  .  Lack of Transportation (Non-Medical): No  Physical Activity: Not on file  Stress: No Stress Concern Present  . Feeling of Stress : Not at all  Social Connections: Socially Isolated  . Frequency of Communication with Friends and Family: More than three times a week  . Frequency of Social Gatherings with Friends and Family: More than three times a week  . Attends Religious Services: Never  . Active Member of Clubs or Organizations: No  . Attends Archivist Meetings: Never  . Marital Status: Never married  Intimate Partner Violence: Not At Risk  . Fear of Current or Ex-Partner: No  . Emotionally Abused: No  . Physically Abused: No  . Sexually Abused: No    Outpatient Medications Prior to Visit  Medication Sig Dispense Refill  . amLODipine (NORVASC) 10 MG tablet TAKE 1 TABLET  EVERY DAY 90 tablet 0  . atorvastatin (LIPITOR) 20 MG tablet Take 1 tablet (20 mg total) by mouth daily. 90 tablet 3  . b complex vitamins capsule Take 1 capsule by mouth daily. 90 capsule 1  . docusate sodium (COLACE) 100 MG capsule Take 1 capsule (100 mg total) by mouth 2 (two) times daily. 10 capsule 0  . PARoxetine (PAXIL) 20 MG tablet Take 1 tablet (20 mg total) by mouth daily. 90 tablet 3  . polyethylene glycol (MIRALAX / GLYCOLAX) 17 g packet Take 17 g by mouth daily as needed for moderate constipation.    . tamsulosin (FLOMAX) 0.4 MG CAPS capsule Take 2 capsules (0.8 mg total) by mouth daily after supper. 180 capsule 3  . vitamin B-12 (CYANOCOBALAMIN) 1000 MCG tablet Take 1 tablet (1,000 mcg total) by mouth daily. 90 tablet 2  . vitamin E 180 MG (400 UNITS) capsule Take 400 Units by mouth daily. (Patient not taking: No sig reported)     No facility-administered medications prior to visit.    No Known Allergies  ROS Review of Systems  Constitutional: Negative.   HENT: Negative.   Respiratory: Negative.   Cardiovascular: Negative.   Gastrointestinal: Negative.       Objective:    Physical Exam Vitals and nursing note reviewed.  Constitutional:      General: He is not in acute distress.    Appearance: Normal appearance. He is not ill-appearing, toxic-appearing or diaphoretic.  Eyes:     Conjunctiva/sclera: Conjunctivae normal.  Pulmonary:     Effort: Pulmonary effort is normal.  Neurological:     Mental Status: He is alert and oriented to person, place, and time.  Psychiatric:        Mood and Affect: Mood normal.        Behavior: Behavior normal.     Ht 5\' 9"  (1.753 m)   Wt 216 lb (98 kg)   BMI 31.90 kg/m  Wt Readings from Last 3 Encounters:  12/31/20 216 lb (98 kg)  09/15/20 220 lb 6.4 oz (100 kg)  06/09/20 205 lb 6.4 oz (93.2 kg)     Health Maintenance Due  Topic Date Due  . TETANUS/TDAP  Never done  . PNA vac Low Risk Adult (1 of 2 - PCV13) Never done     There are no preventive care reminders to display for this patient.  Lab Results  Component Value Date   TSH 0.72 06/03/2020   Lab Results  Component Value Date   WBC 4.0 06/03/2020   HGB 13.8 06/03/2020   HCT 40.5 06/03/2020   MCV 88.7 06/03/2020   PLT 293.0 06/03/2020  Lab Results  Component Value Date   NA 138 06/03/2020   K 4.0 06/03/2020   CO2 26 06/03/2020   GLUCOSE 96 06/03/2020   BUN 17 06/03/2020   CREATININE 1.24 06/03/2020   BILITOT 0.6 06/03/2020   ALKPHOS 84 06/03/2020   AST 18 06/03/2020   ALT 13 06/03/2020   PROT 7.1 06/03/2020   ALBUMIN 4.2 06/03/2020   CALCIUM 9.8 06/03/2020   ANIONGAP 10 01/31/2019   GFR 59.15 (L) 06/03/2020   Lab Results  Component Value Date   CHOL 281 (H) 08/03/2018   Lab Results  Component Value Date   HDL 55 08/03/2018   Lab Results  Component Value Date   LDLCALC 199 (H) 08/03/2018   Lab Results  Component Value Date   TRIG 122 08/03/2018   Lab Results  Component Value Date   CHOLHDL 5.1 (H) 08/03/2018   No results found for: HGBA1C    Assessment & Plan:   Problem List Items Addressed This Visit      Other   Healthcare maintenance - Primary   Relevant Orders   Ambulatory referral to Gastroenterology      No orders of the defined types were placed in this encounter.   Follow-up: Return in about 1 month (around 01/31/2021), or Bring all medicines..   Asked him to return for health check 2 or 3 weeks after his thyroid surgery and bring all of his medicines with him.   Libby Maw, MD   Virtual Visit via Video Note  I connected with Antonietta Jewel on 12/31/20 at  8:30 AM EDT by a video enabled telemedicine application and verified that I am speaking with the correct person using two identifiers.  Location: Patient: in his yard alone.  Provider: work   I discussed the limitations of evaluation and management by telemedicine and the availability of in person appointments. The patient  expressed understanding and agreed to proceed.  History of Present Illness:    Observations/Objective:   Assessment and Plan:   Follow Up Instructions:    I discussed the assessment and treatment plan with the patient. The patient was provided an opportunity to ask questions and all were answered. The patient agreed with the plan and demonstrated an understanding of the instructions.   The patient was advised to call back or seek an in-person evaluation if the symptoms worsen or if the condition fails to improve as anticipated.  I provided 20 minutes of non-face-to-face time during this encounter.   Libby Maw, MD

## 2021-01-01 ENCOUNTER — Other Ambulatory Visit (HOSPITAL_COMMUNITY): Payer: Medicare HMO

## 2021-01-02 ENCOUNTER — Encounter (HOSPITAL_COMMUNITY): Payer: Self-pay | Admitting: Surgery

## 2021-01-04 NOTE — Patient Instructions (Signed)
DUE TO COVID-19 ONLY ONE VISITOR IS ALLOWED TO COME WITH YOU AND STAY IN THE WAITING ROOM ONLY DURING PRE OP AND PROCEDURE DAY OF SURGERY. THE 2 VISITORs  MAY VISIT WITH YOU AFTER SURGERY IN YOUR PRIVATE ROOM DURING VISITING HOURS ONLY!  YOU NEED TO HAVE A COVID 19 TEST ON_5/17______ @_11 :45______, THIS TEST MUST BE DONE BEFORE SURGERY,  COVID TESTING SITE [place] [address], IT IS ON THE RIGHT GOING OUT [address] APPROXIMATELY  2 MINUTES PAST [employer] ON THE RIGHT. ONCE YOUR COVID TEST IS COMPLETED,  PLEASE BEGIN THE QUARANTINE INSTRUCTIONS AS OUTLINED IN YOUR HANDOUT.                Logan Orr    Your procedure is scheduled on: 01/07/21   Report to [facility] Main  Entrance   Report to admitting at 9:00 AM     Call this number if you have problems the morning of surgery [phone]    . BRUSH YOUR TEETH MORNING OF SURGERY AND RINSE YOUR MOUTH OUT, NO CHEWING GUM CANDY OR MINTS.    No food after midnight.    You may have clear liquid until 8:00 AM.       Nothing by mouth after 8:00 AM.   Take these medicines the morning of surgery with A SIP OF WATER: Paxil, Amlodipine, Tamsulosin                                 You may not have any metal on your body including hair pins and              piercings  Do not wear jewelry,  lotions, powders or deodorant              Men may shave face and neck.   Do not bring valuables to the hospital. Logan Orr.  Contacts, dentures or bridgework may not be worn into surgery.                   Please read over the following fact sheets you were given: _____________________________________________________________________             [facility] - Preparing for Surgery Before surgery, you can play an important role.  Because skin is not sterile, your skin needs to be as free of germs as possible.  You can reduce the number of germs on  your skin by washing with CHG (chlorahexidine gluconate) soap before surgery.  CHG is an antiseptic cleaner which kills germs and bonds with the skin to continue killing germs even after washing. Please DO NOT use if you have an allergy to CHG or antibacterial soaps.  If your skin becomes reddened/irritated stop using the CHG and inform your nurse when you arrive at Short Stay. .  You may shave your face/neck.  Please follow these instructions carefully:  1.  Shower with CHG Soap the night before surgery and the  morning of Surgery.  2.  If you choose to wash your hair, wash your hair first as usual with your  normal  shampoo.  3.  After you shampoo, rinse your hair and body thoroughly to remove the  shampoo.  4.  Use CHG as you would any other liquid soap.  You can apply chg directly  to the skin and wash                       Gently with a scrungie or clean washcloth.  5.  Apply the CHG Soap to your body ONLY FROM THE NECK DOWN.   Do not use on face/ open                           Wound or open sores. Avoid contact with eyes, ears mouth and genitals (private parts).                       Wash face,  Genitals (private parts) with your normal soap.             6.  Wash thoroughly, paying special attention to the area where your surgery  will be performed.  7.  Thoroughly rinse your body with warm water from the neck down.  8.  DO NOT shower/wash with your normal soap after using and rinsing off  the CHG Soap.             9.  Pat yourself dry with a clean towel.            10.  Wear clean pajamas.            11.  Place clean sheets on your bed the night of your first shower and do not  sleep with pets. Day of Surgery : Do not apply any lotions/deodorants the morning of surgery.  Please wear clean clothes to the hospital/surgery center.  FAILURE TO FOLLOW THESE INSTRUCTIONS MAY RESULT IN THE CANCELLATION OF YOUR SURGERY PATIENT  SIGNATURE_________________________________  NURSE SIGNATURE__________________________________  ________________________________________________________________________

## 2021-01-05 ENCOUNTER — Ambulatory Visit (HOSPITAL_COMMUNITY)
Admission: RE | Admit: 2021-01-05 | Discharge: 2021-01-05 | Disposition: A | Discharge status: Home / Self Care | Payer: Medicare HMO | Source: Ambulatory Visit | Attending: Anesthesiology | Admitting: Anesthesiology

## 2021-01-05 ENCOUNTER — Encounter (HOSPITAL_COMMUNITY)
Admission: RE | Admit: 2021-01-05 | Discharge: 2021-01-05 | Disposition: A | Discharge status: Home / Self Care | Payer: Medicare HMO | Source: Ambulatory Visit | Attending: Surgery | Admitting: Surgery

## 2021-01-05 ENCOUNTER — Other Ambulatory Visit (HOSPITAL_COMMUNITY)
Admission: RE | Admit: 2021-01-05 | Discharge: 2021-01-05 | Disposition: A | Discharge status: Home / Self Care | Payer: Medicare HMO | Source: Ambulatory Visit | Attending: Surgery | Admitting: Surgery

## 2021-01-05 ENCOUNTER — Encounter (HOSPITAL_COMMUNITY): Payer: Self-pay

## 2021-01-05 ENCOUNTER — Other Ambulatory Visit: Payer: Self-pay

## 2021-01-05 DIAGNOSIS — Z20822 Contact with and (suspected) exposure to covid-19: Secondary | ICD-10-CM | POA: Insufficient documentation

## 2021-01-05 DIAGNOSIS — Z01812 Encounter for preprocedural laboratory examination: Secondary | ICD-10-CM | POA: Insufficient documentation

## 2021-01-05 DIAGNOSIS — Z01818 Encounter for other preprocedural examination: Secondary | ICD-10-CM | POA: Insufficient documentation

## 2021-01-05 DIAGNOSIS — R918 Other nonspecific abnormal finding of lung field: Secondary | ICD-10-CM | POA: Diagnosis not present

## 2021-01-05 LAB — BASIC METABOLIC PANEL
Anion gap: 7 (ref 5–15)
BUN: 13 mg/dL (ref 8–23)
CO2: 27 mmol/L (ref 22–32)
Calcium: 9.7 mg/dL (ref 8.9–10.3)
Chloride: 105 mmol/L (ref 98–111)
Creatinine, Ser: 1.07 mg/dL (ref 0.61–1.24)
GFR, Estimated: >60 mL/min (ref >60)
Glucose, Bld: 111 mg/dL — ABNORMAL HIGH (ref 70–99)
Potassium: 3.7 mmol/L (ref 3.5–5.1)
Sodium: 139 mmol/L (ref 135–145)

## 2021-01-05 LAB — CBC
HCT: 43.3 % (ref 39.0–52.0)
Hemoglobin: 14.2 g/dL (ref 13.0–17.0)
MCH: 29.8 pg (ref 26.0–34.0)
MCHC: 32.8 g/dL (ref 30.0–36.0)
MCV: 90.8 fL (ref 80.0–100.0)
Platelets: 267 10*3/uL (ref 150–400)
RBC: 4.77 MIL/uL (ref 4.22–5.81)
RDW: 13.4 % (ref 11.5–15.5)
WBC: 4.4 10*3/uL (ref 4.0–10.5)
nRBC: 0 % (ref 0.0–0.2)

## 2021-01-05 LAB — SARS CORONAVIRUS 2 (TAT 6-24 HRS): SARS Coronavirus 2: NEGATIVE

## 2021-01-05 NOTE — Progress Notes (Signed)
COVID Vaccine Completed:no Date COVID Vaccine completed: COVID vaccine manufacturer: Pfizer    Moderna   [employer]   PCP - Dr. Otho Bellows Cardiologist - none  Chest x-ray - no EKG - 01/05/21-chart, epic Stress Test - no ECHO - no Cardiac Cath - no Pacemaker/ICD device last checked:NA Pt states that he had a cardiac work up while in [place] that turned out to be a infection in his lungs. He doesn't remember the hospital or the Dr. Only that it was in [place], [place]  Sleep Study - Yes negative findings CPAP -   Fasting Blood Sugar - NA Checks Blood Sugar _____ times a day  Blood Thinner Instructions:NA Aspirin Instructions: Last Dose:  Anesthesia review:   Patient denies shortness of breath, fever, cough and chest pain at PAT appointment Yes. Pt reports no SOB with activities. He said that he has a tremor for about a year that he will talk to his Dr. Janace Hoard. He drinks beer daily  Patient verbalized understanding of instructions that were given to them at the PAT appointment. Patient was also instructed that they will need to review over the PAT instructions again at home before surgery.

## 2021-01-07 ENCOUNTER — Encounter (HOSPITAL_COMMUNITY)
Admission: RE | Disposition: A | Discharge status: Home / Self Care | Payer: Self-pay | Source: Home / Self Care | Attending: Surgery

## 2021-01-07 ENCOUNTER — Ambulatory Visit (HOSPITAL_COMMUNITY): Payer: Medicare HMO | Admitting: Certified Registered Nurse Anesthetist

## 2021-01-07 ENCOUNTER — Ambulatory Visit (HOSPITAL_COMMUNITY)
Admission: RE | Admit: 2021-01-07 | Discharge: 2021-01-08 | Disposition: A | Discharge status: Home / Self Care | Payer: Medicare HMO | Attending: Surgery | Admitting: Surgery

## 2021-01-07 ENCOUNTER — Other Ambulatory Visit: Payer: Self-pay

## 2021-01-07 ENCOUNTER — Encounter (HOSPITAL_COMMUNITY): Payer: Self-pay | Admitting: Surgery

## 2021-01-07 DIAGNOSIS — I10 Essential (primary) hypertension: Secondary | ICD-10-CM | POA: Diagnosis not present

## 2021-01-07 DIAGNOSIS — Z79899 Other long term (current) drug therapy: Secondary | ICD-10-CM | POA: Diagnosis not present

## 2021-01-07 DIAGNOSIS — C73 Malignant neoplasm of thyroid gland: Secondary | ICD-10-CM | POA: Diagnosis not present

## 2021-01-07 DIAGNOSIS — D440 Neoplasm of uncertain behavior of thyroid gland: Secondary | ICD-10-CM | POA: Diagnosis present

## 2021-01-07 DIAGNOSIS — E042 Nontoxic multinodular goiter: Secondary | ICD-10-CM | POA: Diagnosis present

## 2021-01-07 DIAGNOSIS — E785 Hyperlipidemia, unspecified: Secondary | ICD-10-CM | POA: Diagnosis not present

## 2021-01-07 HISTORY — PX: THYROIDECTOMY: SHX17

## 2021-01-07 SURGERY — THYROIDECTOMY
Anesthesia: General | Site: Neck

## 2021-01-07 MED ORDER — MENTHOL 3 MG MT LOZG
1.0000 | LOZENGE | OROMUCOSAL | Status: DC | PRN
  Filled 2021-01-07: qty 9

## 2021-01-07 MED ORDER — PROPOFOL 10 MG/ML IV BOLUS
INTRAVENOUS | Status: AC
  Filled 2021-01-07: qty 20

## 2021-01-07 MED ORDER — ORAL CARE MOUTH RINSE: 15.0000 mL | Freq: Once | OROMUCOSAL | Status: AC

## 2021-01-07 MED ORDER — TRAMADOL HCL 50 MG PO TABS
50.0000 mg | ORAL_TABLET | Freq: Four times a day (QID) | ORAL | Status: DC | PRN
  Administered 2021-01-07: 50 mg via ORAL
  Filled 2021-01-07: qty 1

## 2021-01-07 MED ORDER — FENTANYL CITRATE (PF) 100 MCG/2ML IJ SOLN
INTRAMUSCULAR | Status: AC
  Filled 2021-01-07: qty 2

## 2021-01-07 MED ORDER — CALCIUM CARBONATE 1250 (500 CA) MG PO TABS
2.0000 | ORAL_TABLET | Freq: Three times a day (TID) | ORAL | Status: DC
  Administered 2021-01-07 – 2021-01-08 (×2): 1000 mg via ORAL
  Filled 2021-01-07 (×2): qty 1

## 2021-01-07 MED ORDER — CEFAZOLIN SODIUM-DEXTROSE 2-4 GM/100ML-% IV SOLN
2.0000 g | INTRAVENOUS | Status: AC
  Administered 2021-01-07: 2 g via INTRAVENOUS
  Filled 2021-01-07: qty 100

## 2021-01-07 MED ORDER — DEXAMETHASONE SODIUM PHOSPHATE 4 MG/ML IJ SOLN
INTRAMUSCULAR | Status: DC | PRN
  Administered 2021-01-07: 5 mg via INTRAVENOUS

## 2021-01-07 MED ORDER — LIDOCAINE 2% (20 MG/ML) 5 ML SYRINGE
INTRAMUSCULAR | Status: AC
  Filled 2021-01-07: qty 5

## 2021-01-07 MED ORDER — OXYCODONE HCL 5 MG PO TABS: 5.0000 mg | ORAL_TABLET | ORAL | Status: DC | PRN

## 2021-01-07 MED ORDER — SODIUM CHLORIDE 0.45 % IV SOLN: INTRAVENOUS | Status: DC

## 2021-01-07 MED ORDER — ONDANSETRON HCL 4 MG/2ML IJ SOLN: 4.0000 mg | Freq: Once | INTRAMUSCULAR | Status: DC | PRN

## 2021-01-07 MED ORDER — TAMSULOSIN HCL 0.4 MG PO CAPS
0.8000 mg | ORAL_CAPSULE | Freq: Every day | ORAL | Status: DC
  Administered 2021-01-07 – 2021-01-08 (×2): 0.8 mg via ORAL
  Filled 2021-01-07 (×2): qty 2

## 2021-01-07 MED ORDER — PROPOFOL 10 MG/ML IV BOLUS
INTRAVENOUS | Status: DC | PRN
  Administered 2021-01-07: 200 mg via INTRAVENOUS

## 2021-01-07 MED ORDER — 0.9 % SODIUM CHLORIDE (POUR BTL) OPTIME
TOPICAL | Status: DC | PRN
  Administered 2021-01-07: 1000 mL

## 2021-01-07 MED ORDER — PAROXETINE HCL 20 MG PO TABS
20.0000 mg | ORAL_TABLET | Freq: Every day | ORAL | Status: DC
  Filled 2021-01-07 (×2): qty 1

## 2021-01-07 MED ORDER — ONDANSETRON 4 MG PO TBDP: 4.0000 mg | ORAL_TABLET | Freq: Four times a day (QID) | ORAL | Status: DC | PRN

## 2021-01-07 MED ORDER — LIDOCAINE 2% (20 MG/ML) 5 ML SYRINGE
INTRAMUSCULAR | Status: DC | PRN
  Administered 2021-01-07: 60 mg via INTRAVENOUS

## 2021-01-07 MED ORDER — SUGAMMADEX SODIUM 200 MG/2ML IV SOLN
INTRAVENOUS | Status: DC | PRN
  Administered 2021-01-07: 200 mg via INTRAVENOUS

## 2021-01-07 MED ORDER — OXYCODONE HCL 5 MG/5ML PO SOLN: 5.0000 mg | Freq: Once | ORAL | Status: DC | PRN

## 2021-01-07 MED ORDER — HYDROMORPHONE HCL 1 MG/ML IJ SOLN
INTRAMUSCULAR | Status: AC
  Filled 2021-01-07: qty 1

## 2021-01-07 MED ORDER — CHLORHEXIDINE GLUCONATE CLOTH 2 % EX PADS: 6.0000 | MEDICATED_PAD | Freq: Once | CUTANEOUS | Status: DC

## 2021-01-07 MED ORDER — ROCURONIUM BROMIDE 10 MG/ML (PF) SYRINGE
PREFILLED_SYRINGE | INTRAVENOUS | Status: AC
  Filled 2021-01-07: qty 10

## 2021-01-07 MED ORDER — ACETAMINOPHEN 650 MG RE SUPP: 650.0000 mg | Freq: Four times a day (QID) | RECTAL | Status: DC | PRN

## 2021-01-07 MED ORDER — ACETAMINOPHEN 500 MG PO TABS: 1000.0000 mg | ORAL_TABLET | Freq: Once | ORAL | Status: DC

## 2021-01-07 MED ORDER — ONDANSETRON HCL 4 MG/2ML IJ SOLN: 4.0000 mg | Freq: Four times a day (QID) | INTRAMUSCULAR | Status: DC | PRN

## 2021-01-07 MED ORDER — MIDAZOLAM HCL 5 MG/5ML IJ SOLN
INTRAMUSCULAR | Status: DC | PRN
  Administered 2021-01-07: 2 mg via INTRAVENOUS

## 2021-01-07 MED ORDER — HYDROMORPHONE HCL 1 MG/ML IJ SOLN: 0.2500 mg | INTRAMUSCULAR | Status: DC | PRN

## 2021-01-07 MED ORDER — ONDANSETRON HCL 4 MG/2ML IJ SOLN
INTRAMUSCULAR | Status: AC
  Filled 2021-01-07: qty 2

## 2021-01-07 MED ORDER — LACTATED RINGERS IV SOLN: INTRAVENOUS | Status: DC

## 2021-01-07 MED ORDER — MIDAZOLAM HCL 2 MG/2ML IJ SOLN
INTRAMUSCULAR | Status: AC
  Filled 2021-01-07: qty 2

## 2021-01-07 MED ORDER — OXYCODONE HCL 5 MG PO TABS: 5.0000 mg | ORAL_TABLET | Freq: Once | ORAL | Status: DC | PRN

## 2021-01-07 MED ORDER — OXYCODONE HCL 5 MG PO TABS
5.0000 mg | ORAL_TABLET | Freq: Once | ORAL | Status: DC | PRN
Start: 2021-01-07 — End: 2021-01-07

## 2021-01-07 MED ORDER — FENTANYL CITRATE (PF) 100 MCG/2ML IJ SOLN
INTRAMUSCULAR | Status: DC | PRN
  Administered 2021-01-07 (×2): 100 ug via INTRAVENOUS
  Administered 2021-01-07: 50 ug via INTRAVENOUS

## 2021-01-07 MED ORDER — EPHEDRINE SULFATE-NACL 50-0.9 MG/10ML-% IV SOSY
PREFILLED_SYRINGE | INTRAVENOUS | Status: DC | PRN
  Administered 2021-01-07: 5 mg via INTRAVENOUS

## 2021-01-07 MED ORDER — ROCURONIUM BROMIDE 10 MG/ML (PF) SYRINGE
PREFILLED_SYRINGE | INTRAVENOUS | Status: DC | PRN
  Administered 2021-01-07: 100 mg via INTRAVENOUS

## 2021-01-07 MED ORDER — DEXAMETHASONE SODIUM PHOSPHATE 10 MG/ML IJ SOLN
INTRAMUSCULAR | Status: AC
  Filled 2021-01-07: qty 1

## 2021-01-07 MED ORDER — GLYCOPYRROLATE PF 0.2 MG/ML IJ SOSY
PREFILLED_SYRINGE | INTRAMUSCULAR | Status: AC
  Filled 2021-01-07: qty 1

## 2021-01-07 MED ORDER — HYDROMORPHONE HCL 1 MG/ML IJ SOLN
1.0000 mg | INTRAMUSCULAR | Status: DC | PRN
  Administered 2021-01-07: 1 mg via INTRAVENOUS
  Filled 2021-01-07: qty 1

## 2021-01-07 MED ORDER — CHLORHEXIDINE GLUCONATE 0.12 % MT SOLN
15.0000 mL | Freq: Once | OROMUCOSAL | Status: AC
  Administered 2021-01-07: 15 mL via OROMUCOSAL

## 2021-01-07 MED ORDER — AMLODIPINE BESYLATE 10 MG PO TABS
10.0000 mg | ORAL_TABLET | Freq: Every day | ORAL | Status: DC
  Administered 2021-01-07 – 2021-01-08 (×2): 10 mg via ORAL
  Filled 2021-01-07 (×2): qty 1

## 2021-01-07 MED ORDER — ONDANSETRON HCL 4 MG/2ML IJ SOLN
INTRAMUSCULAR | Status: DC | PRN
  Administered 2021-01-07: 4 mg via INTRAVENOUS

## 2021-01-07 MED ORDER — ACETAMINOPHEN 325 MG PO TABS: 650.0000 mg | ORAL_TABLET | Freq: Four times a day (QID) | ORAL | Status: DC | PRN

## 2021-01-07 MED ORDER — HYDROMORPHONE HCL 1 MG/ML IJ SOLN
INTRAMUSCULAR | Status: DC | PRN
  Administered 2021-01-07: 1 mg via INTRAVENOUS

## 2021-01-07 MED ORDER — PHENYLEPHRINE 40 MCG/ML (10ML) SYRINGE FOR IV PUSH (FOR BLOOD PRESSURE SUPPORT)
PREFILLED_SYRINGE | INTRAVENOUS | Status: DC | PRN
  Administered 2021-01-07: 40 ug via INTRAVENOUS

## 2021-01-07 SURGICAL SUPPLY — 29 items
ATTRACTOMAT 16X20 MAGNETIC DRP (DRAPES) ×2 IMPLANT
BLADE SURG 15 STRL LF DISP TIS (BLADE) ×1 IMPLANT
BLADE SURG 15 STRL SS (BLADE) ×1
CHLORAPREP W/TINT 26 (MISCELLANEOUS) ×2 IMPLANT
CLIP VESOCCLUDE MED 6/CT (CLIP) ×6 IMPLANT
CLIP VESOCCLUDE SM WIDE 6/CT (CLIP) ×12 IMPLANT
COVER SURGICAL LIGHT HANDLE (MISCELLANEOUS) ×2 IMPLANT
COVER WAND RF STERILE (DRAPES) ×2 IMPLANT
DERMABOND ADVANCED (GAUZE/BANDAGES/DRESSINGS) ×1
DERMABOND ADVANCED .7 DNX12 (GAUZE/BANDAGES/DRESSINGS) ×1 IMPLANT
DRAPE LAPAROTOMY T 98X78 PEDS (DRAPES) ×2 IMPLANT
DRAPE UTILITY XL STRL (DRAPES) ×2 IMPLANT
ELECT PENCIL ROCKER SW 15FT (MISCELLANEOUS) ×2 IMPLANT
ELECT REM PT RETURN 15FT ADLT (MISCELLANEOUS) ×2 IMPLANT
GAUZE 4X4 16PLY RFD (DISPOSABLE) ×2 IMPLANT
GLOVE SURG ORTHO LTX SZ8 (GLOVE) ×2 IMPLANT
GOWN STRL REUS W/TWL XL LVL3 (GOWN DISPOSABLE) ×4 IMPLANT
HEMOSTAT SURGICEL 2X4 FIBR (HEMOSTASIS) ×2 IMPLANT
ILLUMINATOR WAVEGUIDE N/F (MISCELLANEOUS) ×2 IMPLANT
KIT BASIN OR (CUSTOM PROCEDURE TRAY) ×2 IMPLANT
KIT TURNOVER KIT A (KITS) ×2 IMPLANT
PACK BASIC VI WITH GOWN DISP (CUSTOM PROCEDURE TRAY) ×2 IMPLANT
SHEARS HARMONIC 9CM CVD (BLADE) ×2 IMPLANT
SUT MNCRL AB 4-0 PS2 18 (SUTURE) ×2 IMPLANT
SUT VIC AB 3-0 SH 18 (SUTURE) ×4 IMPLANT
SYR BULB IRRIG 60ML STRL (SYRINGE) ×2 IMPLANT
TOWEL OR 17X26 10 PK STRL BLUE (TOWEL DISPOSABLE) ×2 IMPLANT
TOWEL OR NON WOVEN STRL DISP B (DISPOSABLE) ×2 IMPLANT
TUBING CONNECTING 10 (TUBING) ×2 IMPLANT

## 2021-01-07 NOTE — Op Note (Signed)
Procedure Note  Pre-operative Diagnosis:  Thyroid neoplasm of uncertain behavior, multiple thyroid nodules  Post-operative Diagnosis:  same  Surgeon:  Armandina Gemma, MD  Assistant:  none   Procedure:  Total thyroidectomy  Anesthesia:  General  Estimated Blood Loss:  minimal  Drains: none         Specimen: thyroid to pathology  Indications:  Patient is referred by Dr. [name] for surgical evaluation and management of thyroid neoplasm of uncertain behavior suspicious for thyroid carcinoma. Patient's primary care physician is Dr. Samule Ohm. Patient was noted on routine physical examination by his primary care physician to have thyroid nodules. He was referred for a thyroid ultrasound which was performed on May 04, 2020. This demonstrated multiple thyroid nodules involving both thyroid lobes. There was a dominant nodule in the thyroid isthmus measuring 2.0 cm in greatest dimension. It appeared lobulated and irregular. Biopsy was recommended. Fine-needle aspiration biopsy was performed on September 09, 2020. Cytopathology returned as suspicious for malignancy, Bethesda category V. Patient is now referred for consideration for surgical resection for definitive diagnosis and management.  Procedure Details: Procedure was done in OR #5 at the [facility]. The patient was brought to the operating room and placed in a supine position on the operating room table. Following administration of general anesthesia, the patient was positioned and then prepped and draped in the usual aseptic fashion. After ascertaining that an adequate level of anesthesia had been achieved, a small Kocher incision was made with #15 blade. Dissection was carried through subcutaneous tissues and platysma.Hemostasis was achieved with the electrocautery. Skin flaps were elevated cephalad and caudad from the thyroid notch to the sternal notch. A Mahorner self-retaining retractor was placed for exposure. Strap  muscles were incised in the midline and dissection was begun on the left side.  Strap muscles were reflected laterally.  Left thyroid lobe was mildly enlarged and nodular.  The left lobe was gently mobilized with blunt dissection. Superior pole vessels were dissected out and divided individually between small and medium ligaclips with the harmonic scalpel. The thyroid lobe was rolled anteriorly. Branches of the inferior thyroid artery were divided between small ligaclips with the harmonic scalpel. Inferior venous tributaries were divided between ligaclips. Both the superior and inferior parathyroid glands were identified and preserved on their vascular pedicles. The recurrent laryngeal nerve was identified and preserved along its course. The ligament of Gwenlyn Found was released with the electrocautery and the gland was mobilized onto the anterior trachea. Isthmus was mobilized across the midline. There was a small pyramidal lobe present which was resected with the isthmus.  There was a dominant mass in the mid isthmus. Dry pack was placed in the left neck.  The right thyroid lobe was gently mobilized with blunt dissection. Right thyroid lobe was moderately enlarged and nodular. Superior pole vessels were dissected out and divided between small and medium ligaclips with the Harmonic scalpel. Superior parathyroid was identified and preserved. Inferior venous tributaries were divided between medium ligaclips with the harmonic scalpel. The right thyroid lobe was rolled anteriorly and the branches of the inferior thyroid artery divided between small ligaclips. The right recurrent laryngeal nerve was identified and preserved along its course. The ligament of Gwenlyn Found was released with the electrocautery. The right thyroid lobe was mobilized onto the anterior trachea and the remainder of the thyroid was dissected off the anterior trachea and the thyroid was completely excised. A suture was used to mark the left lobe. The entire  thyroid gland was  submitted to pathology for review.  The neck was irrigated with warm saline. Fibrillar was placed throughout the operative field. Strap muscles were approximated in the midline with interrupted 3-0 Vicryl sutures. Platysma was closed with interrupted 3-0 Vicryl sutures. Skin was closed with a running 4-0 Monocryl subcuticular suture. Wound was washed and Dermabond was applied. The patient was awakened from anesthesia and brought to the recovery room. The patient tolerated the procedure well.   Armandina Gemma, MD [facility] Office: [phone]

## 2021-01-07 NOTE — Anesthesia Postprocedure Evaluation (Signed)
Anesthesia Post Note  Patient: Logan Orr  Procedure(s) Performed: TOTAL THYROIDECTOMY (N/A Neck)     Patient location during evaluation: PACU Anesthesia Type: General Level of consciousness: awake and alert Pain management: pain level controlled Vital Signs Assessment: post-procedure vital signs reviewed and stable Respiratory status: spontaneous breathing, nonlabored ventilation and respiratory function stable Cardiovascular status: blood pressure returned to baseline and stable Postop Assessment: no apparent nausea or vomiting Anesthetic complications: no   No complications documented.  Last Vitals:  Vitals:   01/07/21 1345 01/07/21 1400  BP: (!) 158/103 (!) 152/87  Pulse: 88 89  Resp: 19 12  Temp:  36.6 C  SpO2: 96% 98%    Last Pain:  Vitals:   01/07/21 1345  TempSrc:   PainSc: 5                  Lidia Collum

## 2021-01-07 NOTE — Anesthesia Procedure Notes (Signed)
Procedure Name: Intubation Date/Time: 01/07/2021 11:25 AM Performed by: Claudia Desanctis, CRNA Pre-anesthesia Checklist: Patient identified, Emergency Drugs available, Suction available and Patient being monitored Patient Re-evaluated:Patient Re-evaluated prior to induction Oxygen Delivery Method: Circle system utilized Preoxygenation: Pre-oxygenation with 100% oxygen Induction Type: IV induction Ventilation: Mask ventilation without difficulty Laryngoscope Size: 2 and Miller Grade View: Grade II Tube type: Oral Tube size: 7.5 mm Number of attempts: 1 Airway Equipment and Method: Stylet Placement Confirmation: ETT inserted through vocal cords under direct vision,  positive ETCO2 and breath sounds checked- equal and bilateral Tube secured with: Tape Dental Injury: Teeth and Oropharynx as per pre-operative assessment

## 2021-01-07 NOTE — Anesthesia Preprocedure Evaluation (Addendum)
Anesthesia Evaluation  Patient identified by MRN, date of birth, ID band Patient awake    Reviewed: Allergy & Precautions, NPO status , Patient's Chart, lab work & pertinent test results  Airway Mallampati: III  TM Distance: >3 FB Neck ROM: Full    Dental  (+) Teeth Intact, Dental Advisory Given,    Pulmonary neg pulmonary ROS,    Pulmonary exam normal breath sounds clear to auscultation       Cardiovascular hypertension (140/100, per pt this is normal for him), Pt. on medications Normal cardiovascular exam Rhythm:Regular Rate:Normal     Neuro/Psych PSYCHIATRIC DISORDERS Anxiety Depression negative neurological ROS     GI/Hepatic negative GI ROS, (+)     substance abuse (21 drinks/wk)  alcohol use,   Endo/Other  BMI 31 Thyroid nodule  Renal/GU negative Renal ROS  negative genitourinary   Musculoskeletal negative musculoskeletal ROS (+)   Abdominal   Peds  Hematology  (+) Blood dyscrasia, anemia , hct 43.3   Anesthesia Other Findings   Reproductive/Obstetrics negative OB ROS                           Anesthesia Physical Anesthesia Plan  ASA: II  Anesthesia Plan: General   Post-op Pain Management:    Induction: Intravenous  PONV Risk Score and Plan: 3 and Ondansetron, Dexamethasone, Midazolam and Treatment may vary due to age or medical condition  Airway Management Planned: Oral ETT  Additional Equipment: None  Intra-op Plan:   Post-operative Plan: Extubation in OR  Informed Consent: I have reviewed the patients History and Physical, chart, labs and discussed the procedure including the risks, benefits and alternatives for the proposed anesthesia with the patient or authorized representative who has indicated his/her understanding and acceptance.     Dental advisory given  Plan Discussed with: CRNA  Anesthesia Plan Comments:         Anesthesia Quick  Evaluation

## 2021-01-07 NOTE — Transfer of Care (Signed)
Immediate Anesthesia Transfer of Care Note  Patient: Logan Orr  Procedure(s) Performed: TOTAL THYROIDECTOMY (N/A Neck)  Patient Location: PACU  Anesthesia Type:General  Level of Consciousness: awake, alert , oriented and patient cooperative  Airway & Oxygen Therapy: Patient Spontanous Breathing and Patient connected to face mask  Post-op Assessment: Report given to RN and Post -op Vital signs reviewed and stable  Post vital signs: Reviewed and stable  Last Vitals:  Vitals Value Taken Time  BP 184/121 01/07/21 1317  Temp    Pulse 84 01/07/21 1319  Resp 10 01/07/21 1318  SpO2 99 % 01/07/21 1319  Vitals shown include unvalidated device data.  Last Pain:  Vitals:   01/07/21 0759  TempSrc: Oral  PainSc:          Complications: No complications documented.

## 2021-01-07 NOTE — Progress Notes (Signed)
Patient was updated that because he had creamer in his coffee this morning at 0500 his surgery would be delayed til 1115-1130 today.  Patient voiced understanding.

## 2021-01-07 NOTE — Interval H&P Note (Signed)
History and Physical Interval Note:  01/07/2021 8:37 AM  Logan Orr  has presented today for surgery, with the diagnosis of TOTAL THYROIDECTOMY.  The various methods of treatment have been discussed with the patient and family. After consideration of risks, benefits and other options for treatment, the patient has consented to    Procedure(s): TOTAL THYROIDECTOMY (N/A) as a surgical intervention.    The patient's history has been reviewed, patient examined, no change in status, stable for surgery.  I have reviewed the patient's chart and labs.  Questions were answered to the patient's satisfaction.    Armandina Gemma, MD [facility] Office: [place]

## 2021-01-08 ENCOUNTER — Encounter (HOSPITAL_COMMUNITY): Payer: Self-pay | Admitting: Surgery

## 2021-01-08 DIAGNOSIS — Z79899 Other long term (current) drug therapy: Secondary | ICD-10-CM | POA: Diagnosis not present

## 2021-01-08 DIAGNOSIS — C73 Malignant neoplasm of thyroid gland: Secondary | ICD-10-CM | POA: Diagnosis not present

## 2021-01-08 LAB — BASIC METABOLIC PANEL
Anion gap: 9 (ref 5–15)
BUN: 15 mg/dL (ref 8–23)
CO2: 25 mmol/L (ref 22–32)
Calcium: 9.5 mg/dL (ref 8.9–10.3)
Chloride: 104 mmol/L (ref 98–111)
Creatinine, Ser: 1.29 mg/dL — ABNORMAL HIGH (ref 0.61–1.24)
GFR, Estimated: >60 mL/min (ref >60)
Glucose, Bld: 156 mg/dL — ABNORMAL HIGH (ref 70–99)
Potassium: 4.3 mmol/L (ref 3.5–5.1)
Sodium: 138 mmol/L (ref 135–145)

## 2021-01-08 MED ORDER — HEMOSTATIC AGENTS (NO CHARGE) OPTIME
TOPICAL | Status: DC | PRN
  Administered 2021-01-07: 1 via TOPICAL

## 2021-01-08 MED ORDER — LEVOTHYROXINE SODIUM 125 MCG PO TABS: 125.0000 ug | ORAL_TABLET | Freq: Every day | ORAL | 3 refills | Status: DC

## 2021-01-08 NOTE — Discharge Instructions (Signed)
[facility]  THYROID & PARATHYROID SURGERY:  POST-OP INSTRUCTIONS  Always review your discharge instruction sheet from the facility where your surgery was performed.  A prescription for pain medication may be given to you upon discharge.  Take your pain medication as prescribed.  If narcotic pain medicine is not needed, then you may take acetaminophen (Tylenol) or ibuprofen (Advil) as needed.  Take your usually prescribed medications unless otherwise directed.  If you need a refill on your pain medication, please contact our office during regular business hours.  Prescriptions cannot be processed by our office after 5 pm or on weekends.  Start with a light diet upon arrival home, such as soup and crackers or toast.  Be sure to drink plenty of fluids daily.  Resume your normal diet the day after surgery.  Most patients will experience some swelling and bruising on the chest and neck area.  Ice packs will help.  Swelling and bruising can take several days to resolve.   It is common to experience some constipation after surgery.  Increasing fluid intake and taking a stool softener (Colace) will usually help or prevent this problem.  A mild laxative (Milk of Magnesia or Miralax) should be taken according to package directions if there has been no bowel movement after 48 hours.  You have steri-strips and a gauze dressing over your incision.  You may remove the gauze bandage on the second day after surgery, and you may shower at that time.  Leave your steri-strips (small skin tapes) in place directly over the incision.  These strips should remain on the skin for 5-7 days and then be removed.  You may get them wet in the shower and pat them dry.  You may resume regular (light) daily activities beginning the next day (such as daily self-care, walking, climbing stairs) gradually increasing activities as tolerated.  You may have sexual intercourse when it is comfortable.  Refrain from  any heavy lifting or straining until approved by your doctor.  You may drive when you no longer are taking prescription pain medication, you can comfortably wear a seatbelt, and you can safely maneuver your car and apply brakes.  You should see your doctor in the office for a follow-up appointment approximately three weeks after your surgery.  Make sure that you call for this appointment within a day or two after you arrive home to insure a convenient appointment time.  WHEN TO CALL YOUR DOCTOR: -- Fever greater than 101.5 -- Inability to urinate -- Nausea and/or vomiting - persistent -- Extreme swelling or bruising -- Continued bleeding from incision -- Increased pain, redness, or drainage from the incision -- Difficulty swallowing or breathing -- Muscle cramping or spasms -- Numbness or tingling in hands or around lips  The clinic staff is available to answer your questions during regular business hours.  Please don't hesitate to call and ask to speak to one of the nurses if you have concerns.  Felicie Kocher, MD [facility] Office: [phone] 

## 2021-01-08 NOTE — Progress Notes (Signed)
Pt alert and oriented, tolerating diet. D/C instructions given and was d/cd home.

## 2021-01-08 NOTE — Discharge Summary (Signed)
    Physician Discharge Summary [facility]  Patient ID: Logan Orr MRN: [id number] DOB/AGE: 1950/10/22 70 y.o.  Admit date: 01/07/2021  Discharge date: 01/08/2021  Discharge Diagnoses:  Principal Problem:   Multiple thyroid nodules Active Problems:   Neoplasm of uncertain behavior of thyroid gland   Discharged Condition: good  Hospital Course: Patient was admitted for observation following thyroid surgery.  Post op course was uncomplicated.  Pain was well controlled.  Tolerated diet.  Post op calcium level on morning following surgery was 9.5 mg/dl.  Patient was prepared for discharge home on POD#1.  Consults: None  Treatments: surgery: total thyroidectomy  Discharge Exam: Blood pressure (!) 160/97, pulse 96, temperature 98.6 F (37 C), temperature source Oral, resp. rate 15, height 5\' 9"  (1.753 m), weight 96.6 kg, SpO2 99 %. HEENT - clear Neck - wound dry and intact; mild STS; voice normal; Dermabond in place Chest - clear bilaterally Cor - RRR   Disposition: Home  Discharge Instructions    Diet - low sodium heart healthy   Complete by: As directed    Ice pack   Complete by: As directed    Increase activity slowly   Complete by: As directed    No dressing needed   Complete by: As directed      Allergies as of 01/08/2021   No Known Allergies     Medication List    TAKE these medications   amLODipine 10 MG tablet Commonly known as: NORVASC TAKE 1 TABLET EVERY DAY   atorvastatin 20 MG tablet Commonly known as: LIPITOR Take 1 tablet (20 mg total) by mouth daily.   b complex vitamins capsule Take 1 capsule by mouth daily.   levothyroxine 125 MCG tablet Commonly known as: Synthroid Take 1 tablet (125 mcg total) by mouth daily before breakfast.   multivitamin with minerals tablet Take 1 tablet by mouth daily.   PARoxetine 20 MG tablet Commonly known as: PAXIL Take 1 tablet (20 mg total) by mouth daily.   tamsulosin 0.4 MG Caps  capsule Commonly known as: FLOMAX Take 2 capsules (0.8 mg total) by mouth daily after supper. What changed: when to take this   vitamin B-12 1000 MCG tablet Commonly known as: CYANOCOBALAMIN Take 1 tablet (1,000 mcg total) by mouth daily.            Discharge Care Instructions  (From admission, onward)         Start     Ordered   01/08/21 0000  No dressing needed        01/08/21 1638          Follow-up Information    Armandina Gemma, MD. Schedule an appointment as soon as possible for a visit in 3 week(s).   Specialty: General Surgery Contact information: [address] [place] [address] [phone]               Armandina Gemma, [facility] Office: [phone]   Signed: Armandina Gemma 01/08/2021, 7:26 AM

## 2021-01-11 LAB — SURGICAL PATHOLOGY

## 2021-01-25 ENCOUNTER — Ambulatory Visit: Payer: Medicare HMO | Admitting: Family Medicine

## 2021-01-27 ENCOUNTER — Other Ambulatory Visit: Payer: Self-pay

## 2021-01-27 ENCOUNTER — Encounter: Payer: Self-pay | Admitting: Family Medicine

## 2021-01-27 ENCOUNTER — Ambulatory Visit (INDEPENDENT_AMBULATORY_CARE_PROVIDER_SITE_OTHER): Payer: Medicare HMO | Admitting: Family Medicine

## 2021-01-27 VITALS — BP 138/80 | HR 84 | Temp 96.9°F | Ht 69.0 in | Wt 215.2 lb

## 2021-01-27 DIAGNOSIS — Z789 Other specified health status: Secondary | ICD-10-CM

## 2021-01-27 DIAGNOSIS — E7800 Pure hypercholesterolemia, unspecified: Secondary | ICD-10-CM | POA: Diagnosis not present

## 2021-01-27 DIAGNOSIS — I10 Essential (primary) hypertension: Secondary | ICD-10-CM | POA: Diagnosis not present

## 2021-01-27 DIAGNOSIS — F1090 Alcohol use, unspecified, uncomplicated: Secondary | ICD-10-CM

## 2021-01-27 DIAGNOSIS — Z7289 Other problems related to lifestyle: Secondary | ICD-10-CM | POA: Diagnosis not present

## 2021-01-27 DIAGNOSIS — E538 Deficiency of other specified B group vitamins: Secondary | ICD-10-CM | POA: Diagnosis not present

## 2021-01-27 DIAGNOSIS — F418 Other specified anxiety disorders: Secondary | ICD-10-CM

## 2021-01-27 DIAGNOSIS — K635 Polyp of colon: Secondary | ICD-10-CM

## 2021-01-27 DIAGNOSIS — R7309 Other abnormal glucose: Secondary | ICD-10-CM | POA: Diagnosis not present

## 2021-01-27 MED ORDER — AMLODIPINE BESYLATE 10 MG PO TABS: 1.0000 | ORAL_TABLET | Freq: Every day | ORAL | 1 refills | Status: DC

## 2021-01-27 NOTE — Progress Notes (Addendum)
Established Patient Office Visit  Subjective:  Patient ID: Logan Orr, male    DOB: 1951-07-15  Age: 70 y.o. MRN: [id number]  CC:  Chief Complaint  Patient presents with   Follow-up    Refill/follow up on medications. No concerns     HPI [facility] presents for follow-up of hypertension, B12 deficiency, recent elevation in glucose, elevated LDL, depression history of colon polyposis and recent thyroidectomy for papillary thyroid cancer.  Has done well since his surgery.  He is following up with Dr. Dwyane Dee for [place] [facility]ology care.  He will see Dr. Lynford Humphrey today.  I had referred him for a follow-up colonoscopy but he tells me that he did not hear about a referral.  Continues atorvastatin for elevated ldl cholesterol without issue.  He has been taking the Paxil intermittently for depression.  Continues to drink intermittently up to 3 beers daily.  Continues B12 supplementation.  Continues amlodipine for hypertension.  Continues follow-up with Dr. Gloriann Loan for BPH and prostate cancer status post seeding.  He is fasting today.  Review of labs from his recent hospitalization do show an elevated glucose.  Past Medical History:  Diagnosis Date   Anginal pain ([place]) 2013   hospital for 5 days in [place]   Dyspnea 2013   Due to lung infection   Hemorrhoids    History of colon polyps    HLD (hyperlipidemia)    Hyperplasia of prostate with lower urinary tract symptoms (LUTS)    Hypertension    Pneumonia 2013   left side infection while in [place]   Prostate cancer [facility]) urologist-- dr bell/  oncologist-- dr Tammi Klippel   dx 11-23-2018  Stage T1c,  3+4   Thyroid nodule    08-03-2018  pt pcp note in epic, left side, ultasound ordered    Wears glasses     Past Surgical History:  Procedure Laterality Date   COLONOSCOPY     CYSTOSCOPY N/A 02/04/2019   Procedure: CYSTOSCOPY;  Surgeon: Lucas Mallow, MD;  Location: [facility];  Service: Urology;  Laterality: N/A;   no seeds seen per Dr Gloriann Loan   EYE SURGERY Bilateral 2020   LACERATION REPAIR  1990s   repair left lower arm complex laceration    PROSTATE BIOPSY  11-23-2018   at dr Gloriann Loan office   [place] N/A 02/04/2019   Procedure: RADIOACTIVE SEED IMPLANT/BRACHYTHERAPY IMPLANT;  Surgeon: Lucas Mallow, MD;  Location[facility];  Service: Urology;  Laterality: N/A;  90 seeds   SPACE OAR INSTILLATION N/A 02/04/2019   Procedure: SPACE OAR INSTILLATION;  Surgeon: Lucas Mallow, MD;  Location: [facility];  Service: Urology;  Laterality: N/A;   THYROIDECTOMY N/A 01/07/2021   Procedure: TOTAL THYROIDECTOMY;  Surgeon: Armandina Gemma, MD;  Location[facility];  Service: General;  Laterality: N/A;    Family History  Adopted: Yes  Problem Relation Age of Onset   Thyroid disease Neg Hx     Social History   Socioeconomic History   Marital status: Single    Spouse name: Not on file   Number of children: 1   Years of education: Not on file   Highest education level: Not on file  Occupational History    Comment: unemployed   Tobacco Use   Smoking status: Never   Smokeless tobacco: Never  Vaping Use   Vaping Use: Never used  Substance and Sexual Activity   Alcohol use: Yes  Alcohol/week: 21.0 standard drinks    Types: 21 Cans of beer per week    Comment: 3-4 beers daily   Drug use: Never   Sexual activity: Yes  Other Topics Concern   Not on file  Social History Narrative   Not on file   Social Determinants of Health   Financial Resource Strain: Medium Risk   Difficulty of Paying Living Expenses: Somewhat hard  Food Insecurity: Food Insecurity Present   Worried About Charity fundraiser in the Last Year: Sometimes true   Ran Out of Food in the Last Year: Sometimes true  Transportation Needs: No Transportation Needs   Lack of Transportation (Medical): No   Lack of Transportation (Non-Medical): No  Physical Activity: Not on file  Stress: No  Stress Concern Present   Feeling of Stress : Not at all  Social Connections: Socially Isolated   Frequency of Communication with Friends and Family: More than three times a week   Frequency of Social Gatherings with Friends and Family: More than three times a week   Attends Religious Services: Never   Marine scientist or Organizations: No   Attends Music therapist: Never   Marital Status: Never married  Human resources officer Violence: Not At Risk   Fear of Current or Ex-Partner: No   Emotionally Abused: No   Physically Abused: No   Sexually Abused: No    Outpatient Medications Prior to Visit  Medication Sig Dispense Refill   atorvastatin (LIPITOR) 20 MG tablet Take 1 tablet (20 mg total) by mouth daily. 90 tablet 3   levothyroxine (SYNTHROID) 125 MCG tablet Take 1 tablet (125 mcg total) by mouth daily before breakfast. 30 tablet 3   Multiple Vitamins-Minerals (MULTIVITAMIN WITH MINERALS) tablet Take 1 tablet by mouth daily.     tamsulosin (FLOMAX) 0.4 MG CAPS capsule Take 2 capsules (0.8 mg total) by mouth daily after supper. (Patient taking differently: Take 0.8 mg by mouth daily.) 180 capsule 3   vitamin B-12 (CYANOCOBALAMIN) 1000 MCG tablet Take 1 tablet (1,000 mcg total) by mouth daily. 90 tablet 2   b complex vitamins capsule Take 1 capsule by mouth daily. 90 capsule 1   PARoxetine (PAXIL) 20 MG tablet Take 1 tablet (20 mg total) by mouth daily. (Patient not taking: Reported on 01/27/2021) 90 tablet 3   amLODipine (NORVASC) 10 MG tablet TAKE 1 TABLET EVERY DAY (Patient not taking: Reported on 01/27/2021) 90 tablet 0   No facility-administered medications prior to visit.    No Known Allergies  ROS Review of Systems  Constitutional: Negative.   HENT: Negative.    Eyes:  Negative for photophobia and visual disturbance.  Respiratory: Negative.    Cardiovascular: Negative.   Gastrointestinal: Negative.   Endocrine: Negative for cold intolerance, heat intolerance,  polyphagia and polyuria.  Genitourinary: Negative.   Musculoskeletal:  Negative for gait problem and joint swelling.  Neurological:  Negative for speech difficulty and weakness.     Objective:    Physical Exam Vitals and nursing note reviewed.  Constitutional:      General: He is not in acute distress.    Appearance: Normal appearance. He is not ill-appearing, toxic-appearing or diaphoretic.  HENT:     Head: Normocephalic and atraumatic.     Right Ear: Tympanic membrane, ear canal and external ear normal.     Left Ear: Tympanic membrane, ear canal and external ear normal.     Mouth/Throat:     Mouth: Mucous membranes are moist.  Pharynx: Oropharynx is clear. No oropharyngeal exudate or posterior oropharyngeal erythema.  Eyes:     General:        Right eye: No discharge.        Left eye: No discharge.     Extraocular Movements: Extraocular movements intact.     Conjunctiva/sclera: Conjunctivae normal.     Pupils: Pupils are equal, round, and reactive to light.  Neck:     Vascular: No carotid bruit.   Cardiovascular:     Rate and Rhythm: Normal rate and regular rhythm.  Pulmonary:     Effort: Pulmonary effort is normal.     Breath sounds: Normal breath sounds.  Abdominal:     General: Abdomen is flat. Bowel sounds are normal. There is no distension.     Palpations: Abdomen is soft. There is no mass.     Tenderness: There is no abdominal tenderness. There is no guarding or rebound.     Hernia: A hernia is present.  Musculoskeletal:     Cervical back: No rigidity or tenderness.  Lymphadenopathy:     Cervical: No cervical adenopathy.  Skin:    General: Skin is warm and dry.  Neurological:     Mental Status: He is alert and oriented to person, place, and time.  Psychiatric:        Mood and Affect: Mood normal.        Behavior: Behavior normal.    BP 138/80   Pulse 84   Temp (!) 96.9 F (36.1 C) (Temporal)   Ht 5\' 9"  (1.753 m)   Wt 215 lb 3.2 oz (97.6 kg)   SpO2  98%   BMI 31.78 kg/m  Wt Readings from Last 3 Encounters:  01/27/21 215 lb 3.2 oz (97.6 kg)  01/07/21 212 lb 15.4 oz (96.6 kg)  01/05/21 213 lb (96.6 kg)     Health Maintenance Due  Topic Date Due   TETANUS/TDAP  Never done   Zoster Vaccines- Shingrix (1 of 2) Never done   PNA vac Low Risk Adult (1 of 2 - PCV13) Never done    There are no preventive care reminders to display for this patient.  Lab Results  Component Value Date   TSH 0.72 06/03/2020   Lab Results  Component Value Date   WBC 3.8 (L) 01/28/2021   HGB 13.6 01/28/2021   HCT 40.9 01/28/2021   MCV 88.3 01/28/2021   PLT 324.0 01/28/2021   Lab Results  Component Value Date   NA 139 01/28/2021   K 4.0 01/28/2021   CO2 26 01/28/2021   GLUCOSE 100 (H) 01/28/2021   BUN 13 01/28/2021   CREATININE 1.30 01/28/2021   BILITOT 0.6 01/28/2021   ALKPHOS 93 01/28/2021   AST 23 01/28/2021   ALT 25 01/28/2021   PROT 7.5 01/28/2021   ALBUMIN 4.6 01/28/2021   CALCIUM 10.0 01/28/2021   ANIONGAP 9 01/08/2021   GFR 56.19 (L) 01/28/2021   Lab Results  Component Value Date   CHOL 190 01/28/2021   Lab Results  Component Value Date   HDL 52.00 01/28/2021   Lab Results  Component Value Date   LDLCALC 199 (H) 08/03/2018   Lab Results  Component Value Date   TRIG 232.0 (H) 01/28/2021   Lab Results  Component Value Date   CHOLHDL 4 01/28/2021   Lab Results  Component Value Date   HGBA1C 6.4 01/28/2021      Assessment & Plan:   Problem List Items Addressed This Visit  Cardiovascular and Mediastinum   Essential hypertension   Relevant Medications   amLODipine (NORVASC) 10 MG tablet   Other Relevant Orders   CBC (Completed)   Comprehensive metabolic panel (Completed)   Urinalysis, Routine w reflex microscopic (Completed)     Digestive   Polyp of colon   Relevant Orders   Ambulatory referral to Gastroenterology     Other   Elevated LDL cholesterol level   Relevant Orders   Lipid panel  (Completed)   Alcohol use   B12 deficiency - Primary   Relevant Orders   Vitamin B12 (Completed)   Depression with anxiety   Other Visit Diagnoses     Elevated glucose       Relevant Orders   Hemoglobin A1c (Completed)       Meds ordered this encounter  Medications   amLODipine (NORVASC) 10 MG tablet    Sig: Take 1 tablet (10 mg total) by mouth daily.    Dispense:  90 tablet    Refill:  1    Follow-up: Return in about 6 months (around 07/29/2021).  Advised patient that Paxil would work better for him without alcohol and if he would take it daily.  He said that he would at least take the Paxil daily.  We will refer him again for colonoscopy to follow-up his history of polyposis.  Continue all medicines as above.  Checking a hemoglobin A1c regarding his elevated glucose.  Libby Maw, MD

## 2021-01-28 ENCOUNTER — Other Ambulatory Visit (INDEPENDENT_AMBULATORY_CARE_PROVIDER_SITE_OTHER): Payer: Medicare HMO

## 2021-01-28 DIAGNOSIS — E7800 Pure hypercholesterolemia, unspecified: Secondary | ICD-10-CM | POA: Diagnosis not present

## 2021-01-28 LAB — URINALYSIS, ROUTINE W REFLEX MICROSCOPIC
Bilirubin Urine: NEGATIVE
Hgb urine dipstick: NEGATIVE
Ketones, ur: NEGATIVE
Leukocytes,Ua: NEGATIVE
Nitrite: NEGATIVE
RBC / HPF: NONE SEEN (ref 0–?)
Specific Gravity, Urine: 1.025 (ref 1.000–1.030)
Total Protein, Urine: NEGATIVE
Urine Glucose: NEGATIVE
Urobilinogen, UA: 0.2 (ref 0.0–1.0)
pH: 6 (ref 5.0–8.0)

## 2021-01-28 NOTE — Progress Notes (Signed)
Per orders of Dr. Ethelene Hal pt is here for labs pt tolerated draw well.

## 2021-01-29 LAB — LIPID PANEL
Cholesterol: 190 mg/dL (ref 0–200)
HDL: 52 mg/dL (ref >39.00)
NonHDL: 138.28
Total CHOL/HDL Ratio: 4
Triglycerides: 232 mg/dL — ABNORMAL HIGH (ref 0.0–149.0)
VLDL: 46.4 mg/dL — ABNORMAL HIGH (ref 0.0–40.0)

## 2021-01-29 LAB — CBC
HCT: 40.9 % (ref 39.0–52.0)
Hemoglobin: 13.6 g/dL (ref 13.0–17.0)
MCHC: 33.2 g/dL (ref 30.0–36.0)
MCV: 88.3 fl (ref 78.0–100.0)
Platelets: 324 10*3/uL (ref 150.0–400.0)
RBC: 4.63 Mil/uL (ref 4.22–5.81)
RDW: 13.7 % (ref 11.5–15.5)
WBC: 3.8 10*3/uL — ABNORMAL LOW (ref 4.0–10.5)

## 2021-01-29 LAB — COMPREHENSIVE METABOLIC PANEL
ALT: 25 U/L (ref 0–53)
AST: 23 U/L (ref 0–37)
Albumin: 4.6 g/dL (ref 3.5–5.2)
Alkaline Phosphatase: 93 U/L (ref 39–117)
BUN: 13 mg/dL (ref 6–23)
CO2: 26 mEq/L (ref 19–32)
Calcium: 10 mg/dL (ref 8.4–10.5)
Chloride: 104 mEq/L (ref 96–112)
Creatinine, Ser: 1.3 mg/dL (ref 0.40–1.50)
GFR: 56.19 mL/min — ABNORMAL LOW (ref >60.00)
Glucose, Bld: 100 mg/dL — ABNORMAL HIGH (ref 70–99)
Potassium: 4 mEq/L (ref 3.5–5.1)
Sodium: 139 mEq/L (ref 135–145)
Total Bilirubin: 0.6 mg/dL (ref 0.2–1.2)
Total Protein: 7.5 g/dL (ref 6.0–8.3)

## 2021-01-29 LAB — LDL CHOLESTEROL, DIRECT: Direct LDL: 93 mg/dL

## 2021-01-29 LAB — VITAMIN B12: Vitamin B-12: 554 pg/mL (ref 211–911)

## 2021-01-29 LAB — HEMOGLOBIN A1C: Hgb A1c MFr Bld: 6.4 % (ref 4.6–6.5)

## 2021-01-31 NOTE — Addendum Note (Signed)
Addended by: Jon Billings on: 01/31/2021 06:06 AM   Modules accepted: Orders

## 2021-02-04 ENCOUNTER — Ambulatory Visit: Payer: Medicare HMO | Admitting: Gastroenterology

## 2021-03-10 ENCOUNTER — Ambulatory Visit: Payer: Medicare HMO | Admitting: Endocrinology

## 2021-03-17 ENCOUNTER — Other Ambulatory Visit: Payer: Self-pay | Admitting: Family Medicine

## 2021-03-17 DIAGNOSIS — R3911 Hesitancy of micturition: Secondary | ICD-10-CM

## 2021-03-17 DIAGNOSIS — N401 Enlarged prostate with lower urinary tract symptoms: Secondary | ICD-10-CM

## 2021-03-30 ENCOUNTER — Telehealth: Payer: Self-pay

## 2021-03-30 NOTE — Progress Notes (Signed)
Chronic Care Management Pharmacy Assistant   Name: Logan Orr  MRN: [id number] DOB: 05-12-1951  Reason for Encounter:Hypertension Disease State Call.   Recent office visits:  01/27/2021 Dr.Kremer MD (PCP) No medication Changes noted 12/31/2020 Dr.Kremer MD (PCP) Ambulatory referral to Gastroenterology  Recent consult visits:  No recent [facility] visits:  Medication Reconciliation was completed by comparing discharge summary, patient's EMR and Pharmacy list, and upon discussion with patient.  Admitted to the hospital on 01/07/2021 due to Multiple thyroid nodules. Discharge date was 01/08/2021. Discharged from [facility]?Medications Started at [facility] Discharge:?? -started None  Medication Changes at Hospital Discharge: -Changed None  Medications Discontinued at Hospital Discharge: -Stopped None  Medications that remain the same after Hospital Discharge:??  -All other medications will remain the same.    Medications: Outpatient Encounter Medications as of 03/30/2021  Medication Sig   amLODipine (NORVASC) 10 MG tablet Take 1 tablet (10 mg total) by mouth daily.   atorvastatin (LIPITOR) 20 MG tablet Take 1 tablet (20 mg total) by mouth daily.   levothyroxine (SYNTHROID) 125 MCG tablet Take 1 tablet (125 mcg total) by mouth daily before breakfast.   Multiple Vitamins-Minerals (MULTIVITAMIN WITH MINERALS) tablet Take 1 tablet by mouth daily.   PARoxetine (PAXIL) 20 MG tablet Take 1 tablet (20 mg total) by mouth daily. (Patient not taking: Reported on 01/27/2021)   tamsulosin (FLOMAX) 0.4 MG CAPS capsule TAKE 2 CAPSULES EVERY DAY AFTER SUPPER   vitamin B-12 (CYANOCOBALAMIN) 1000 MCG tablet Take 1 tablet (1,000 mcg total) by mouth daily.   No facility-administered encounter medications on file as of 03/30/2021.    Care Gaps: Tetanus Vaccine Shingrix Vaccine PNA Vaccine Influenza Vaccine Star Rating Drugs: Atorvastatin 20 mg 90  day supply last filled 03/01/2021  Medication Fills gaps: Levothyroxine 125 MCG 30 day supply last filled 01/08/2021 Tamsulosin 0.4 MG 90 day supply last filled 11/19/2020.  Reviewed chart prior to disease state call. Spoke with patient regarding BP  Recent Office Vitals: BP Readings from Last 3 Encounters:  01/27/21 138/80  01/08/21 (!) 159/77  01/05/21 (!) 164/94   Pulse Readings from Last 3 Encounters:  01/27/21 84  01/08/21 90  01/05/21 65    Wt Readings from Last 3 Encounters:  01/27/21 215 lb 3.2 oz (97.6 kg)  01/07/21 212 lb 15.4 oz (96.6 kg)  01/05/21 213 lb (96.6 kg)     Kidney Function Lab Results  Component Value Date/Time   CREATININE 1.30 01/28/2021 02:10 PM   CREATININE 1.29 (H) 01/08/2021 04:18 AM   CREATININE 1.35 (H) 08/03/2018 02:19 PM   GFR 56.19 (L) 01/28/2021 02:10 PM   GFRNONAA >60 01/08/2021 04:18 AM   GFRAA 60 (L) 01/31/2019 10:09 AM    BMP Latest Ref Rng & Units 01/28/2021 01/08/2021 01/05/2021  Glucose 70 - 99 mg/dL 100(H) 156(H) 111(H)  BUN 6 - 23 mg/dL '13 15 13  '$ Creatinine 0.40 - 1.50 mg/dL 1.30 1.29(H) 1.07  BUN/Creat Ratio 6 - 22 (calc) - - -  Sodium 135 - 145 mEq/L 139 138 139  Potassium 3.5 - 5.1 mEq/L 4.0 4.3 3.7  Chloride 96 - 112 mEq/L 104 104 105  CO2 19 - 32 mEq/L '26 25 27  '$ Calcium 8.4 - 10.5 mg/dL 10.0 9.5 9.7    Current antihypertensive regimen:  Amlodipine 10 mg 1 tablet daily How often are you checking your Blood Pressure? infrequently Current home BP readings:  Patient states he is unsure of what  his reading are because he is at work. What recent interventions/DTPs have been made by any provider to improve Blood Pressure control since last CPP Visit: None ID Any recent hospitalizations or ED visits since last visit with CPP? No What diet changes have been made to improve Blood Pressure Control?  None ID What exercise is being done to improve your Blood Pressure Control?  Patient states he exercise everyday all  day.  Adherence Review: Is the patient currently on ACE/ARB medication? No Does the patient have >5 day gap between last estimated fill dates? No  Anderson [place] Clinical Production designer, theatre/television/film [phone]

## 2021-04-05 ENCOUNTER — Telehealth: Payer: Self-pay | Admitting: Endocrinology

## 2021-04-05 DIAGNOSIS — E041 Nontoxic single thyroid nodule: Secondary | ICD-10-CM

## 2021-04-05 NOTE — Telephone Encounter (Signed)
Pt is needing a refill request for levothyroxine (SYNTHROID) 125 MCG tablet  Pt would like the medication sent to  [facility], [place] [address]

## 2021-04-07 MED ORDER — LEVOTHYROXINE SODIUM 125 MCG PO TABS: 125.0000 ug | ORAL_TABLET | Freq: Every day | ORAL | 0 refills | Status: DC

## 2021-04-07 NOTE — Telephone Encounter (Signed)
Rx was sent in. I called patient and explained to him that only 15 tabs were being sent in and that he needs to schedule an appointment with prior labs before anymore can be sent. Patient states he will call back to reschedule. He is on [place] with job and thought he would be back by now.

## 2021-04-09 ENCOUNTER — Other Ambulatory Visit: Payer: Self-pay | Admitting: Endocrinology

## 2021-04-09 ENCOUNTER — Other Ambulatory Visit: Payer: Self-pay

## 2021-04-09 DIAGNOSIS — E041 Nontoxic single thyroid nodule: Secondary | ICD-10-CM

## 2021-04-09 DIAGNOSIS — E890 Postprocedural hypothyroidism: Secondary | ICD-10-CM

## 2021-04-09 NOTE — Addendum Note (Signed)
Addended by: Kaylyn Lim I on: 04/09/2021 03:28 PM   Modules accepted: Orders

## 2021-04-14 DIAGNOSIS — E890 Postprocedural hypothyroidism: Secondary | ICD-10-CM | POA: Diagnosis not present

## 2021-04-14 DIAGNOSIS — E041 Nontoxic single thyroid nodule: Secondary | ICD-10-CM | POA: Diagnosis not present

## 2021-04-15 LAB — TSH: TSH: 3.03 u[IU]/mL (ref 0.450–4.500)

## 2021-04-15 LAB — T4, FREE: Free T4: 1.21 ng/dL (ref 0.82–1.77)

## 2021-04-16 LAB — T4, FREE: Free T4: 1.22 ng/dL (ref 0.82–1.77)

## 2021-04-16 LAB — THYROGLOBULIN BY IMA: Thyroglobulin by IMA: 2.5 ng/mL (ref 1.4–29.2)

## 2021-04-16 LAB — TGAB+THYROGLOBULIN IMA OR LCMS: Thyroglobulin Antibody: <1 IU/mL (ref 0.0–0.9)

## 2021-04-16 LAB — TSH: TSH: 3.14 u[IU]/mL (ref 0.450–4.500)

## 2021-04-30 NOTE — Telephone Encounter (Signed)
Pt calling in stating that he is back home now and had the blood draw out of town and they were suppose to fax [place] over his blood work. Pt states that this has been four weeks. Pt states that he only has 1 pill left. Pt has an app on 05/04/2021 and was wondering if he could get the medication until his app.

## 2021-05-04 ENCOUNTER — Ambulatory Visit (INDEPENDENT_AMBULATORY_CARE_PROVIDER_SITE_OTHER): Payer: Medicare HMO | Admitting: Endocrinology

## 2021-05-04 ENCOUNTER — Other Ambulatory Visit: Payer: Self-pay

## 2021-05-04 VITALS — BP 132/90 | HR 90 | Ht 69.0 in | Wt 225.4 lb

## 2021-05-04 DIAGNOSIS — C73 Malignant neoplasm of thyroid gland: Secondary | ICD-10-CM

## 2021-05-04 DIAGNOSIS — E890 Postprocedural hypothyroidism: Secondary | ICD-10-CM | POA: Diagnosis not present

## 2021-05-04 MED ORDER — LEVOTHYROXINE SODIUM 125 MCG PO TABS: 125.0000 ug | ORAL_TABLET | Freq: Every day | ORAL | 0 refills | Status: DC

## 2021-05-04 MED ORDER — LEVOTHYROXINE SODIUM 125 MCG PO TABS: 125.0000 ug | ORAL_TABLET | Freq: Every day | ORAL | 3 refills | Status: DC

## 2021-05-04 NOTE — Patient Instructions (Signed)
Take extra 1/2 on Sundays 

## 2021-05-04 NOTE — Progress Notes (Signed)
Patient ID: Logan Orr, male   DOB: 1950/09/08, 70 y.o.   MRN: [id number]          Reason for Appointment: Follow-up of thyroid    History of Present Illness:   He is finally coming back for follow-up after his thyroidectomy in 12/2020  He has been taking levothyroxine 125 mcg daily but has not had any follow-up thyroid labs since after his surgery Although his tumor was only 1.8 cm in the left isthmus he has had a total thyroidectomy, no thyroid nodules were seen on the pathology on the right side Pathology report was reviewed and is as below  Since his surgery he has not complained of any unusual fatigue but every couple of days he may feel a little tired He has taken his levothyroxine before eating in the morning  Currently TSH is just above 3.0  Pathology report: Tumor Site: Mid left lobe, near the isthmus  Tumor Size: Greatest dimension: 1.8 cm  Histologic Type: Papillary carcinoma  Angioinvasion: Not identified  Lymphatic Invasion: Not identified  Extrathyroidal Extension: Not identified  Margin Status: All margins negative for invasive carcinoma  Regional Lymph Node Status: Not applicable  BACKGROUND history: The patient's thyroid enlargement was first discovered in 04/2020 on a routine exam by his PCP Thyroid ultrasound showed a 2 cm left thyroid nodule with TI-RADS 6 score based on irregular margins  His thyroid biopsy was finally done on 09/09/2020 and showed Bethesda 5 cytology suspicious for malignancy although scant cellularity reported   Lab Results  Component Value Date   FREET4 1.22 04/14/2021   FREET4 1.21 04/14/2021   TSH 3.140 04/14/2021   TSH 3.030 04/14/2021   TSH 0.72 06/03/2020   Thyroglobulin level as follows  Lab Results  Component Value Date   THYROGLB 2.5 04/14/2021    Previous ultrasound exam in 04/2020 showed  Nodule #1 location: Isthmus; Mid   Maximum size: 2.0 cm; Other 2 dimensions: 1.6 x 1.5 cm      Allergies as of 05/04/2021    No Known Allergies      Medication List        Accurate as of May 04, 2021  9:05 PM. If you have any questions, ask your nurse or doctor.          amLODipine 10 MG tablet Commonly known as: NORVASC Take 1 tablet (10 mg total) by mouth daily.   atorvastatin 20 MG tablet Commonly known as: LIPITOR Take 1 tablet (20 mg total) by mouth daily.   levothyroxine 125 MCG tablet Commonly known as: Synthroid Take 1 tablet (125 mcg total) by mouth daily before breakfast.   multivitamin with minerals tablet Take 1 tablet by mouth daily.   PARoxetine 20 MG tablet Commonly known as: PAXIL Take 1 tablet (20 mg total) by mouth daily.   tamsulosin 0.4 MG Caps capsule Commonly known as: FLOMAX TAKE 2 CAPSULES EVERY DAY AFTER SUPPER   vitamin B-12 1000 MCG tablet Commonly known as: CYANOCOBALAMIN Take 1 tablet (1,000 mcg total) by mouth daily.        Allergies: No Known Allergies  Past Medical History:  Diagnosis Date   Anginal pain ([place]) 2013   hospital for 5 days in [place]   Dyspnea 2013   Due to lung infection   Hemorrhoids    History of colon polyps    HLD (hyperlipidemia)    Hyperplasia of prostate with lower urinary tract symptoms (LUTS)    Hypertension    Pneumonia 2013  left side infection while in [place]   Prostate cancer [facility]) urologist-- dr bell/  oncologist-- dr Tammi Klippel   dx 11-23-2018  Stage T1c,  3+4   Thyroid nodule    08-03-2018  pt pcp note in epic, left side, ultasound ordered    Wears glasses     Past Surgical History:  Procedure Laterality Date   COLONOSCOPY     CYSTOSCOPY N/A 02/04/2019   Procedure: CYSTOSCOPY;  Surgeon: Lucas Mallow, MD;  Location[facility];  Service: Urology;  Laterality: N/A;  no seeds seen per Dr Gloriann Loan   EYE SURGERY Bilateral 2020   LACERATION REPAIR  1990s   repair left lower arm complex laceration    PROSTATE BIOPSY  11-23-2018   at dr Gloriann Loan office   [place] N/A  02/04/2019   Procedure: RADIOACTIVE SEED IMPLANT/BRACHYTHERAPY IMPLANT;  Surgeon: Lucas Mallow, MD;  Location: [facility];  Service: Urology;  Laterality: N/A;  90 seeds   SPACE OAR INSTILLATION N/A 02/04/2019   Procedure: SPACE OAR INSTILLATION;  Surgeon: Lucas Mallow, MD;  Location[facility];  Service: Urology;  Laterality: N/A;   THYROIDECTOMY N/A 01/07/2021   Procedure: TOTAL THYROIDECTOMY;  Surgeon: Armandina Gemma, MD;  [facility];  Service: General;  Laterality: N/A;    Family History  Adopted: Yes  Problem Relation Age of Onset   Thyroid disease Neg Hx     Social History:  reports that he has never smoked. He has never used smokeless tobacco. He reports current alcohol use of about 21.0 standard drinks per week. He reports that he does not use drugs.   Review of Systems  He has had variable blood pressure readings, on Norvasc for HYPERTENSION  BP Readings from Last 3 Encounters:  05/04/21 132/90  01/27/21 138/80  01/08/21 (!) 159/77   He is asking about right leg numbness at times in the upper part   Examination:   BP 132/90   Pulse 90   Ht '5\' 9"'$  (1.753 m)   Wt 225 lb 6.4 oz (102.2 kg)   SpO2 97%   BMI 33.29 kg/m   His voice is not hoarse  Scar of thyroidectomy present  Thyroid exam: No mass palpable in the thyroid area No lymphadenopathy in the neck area Deep tendon reflexes are difficult to elicit No ankle edema   Assessment/Plan:  Recently diagnosed papillary thyroid cancer with 1.8 cm tumor and no invasive features or local spread Surgery was done in 5/22 and he had no sequelae of any parathyroid or laryngeal nerve issues  Discussed the diagnosis of his tumor, prognosis and principles of treatment and follow-up Given his low-grade tumor he does not need any I-131 remnant ablation and this was explained  His thyroglobulin is only 2.5 indicating only a small amount of residual thyroid tissue post  thyroidectomy  Since his hypothyroidism needs to be adequately controlled and TSH is over 3.0 he will add an extra half a tablet weekly on his levothyroxine 125 mcg  Recommended that he follow-up with his PCP regarding left upper leg numbness symptoms  Follow-up in 6 months  Total visit time for evaluation and management time counseling = 30 minutes  [name] 05/04/2021

## 2021-05-05 ENCOUNTER — Telehealth: Payer: Self-pay | Admitting: Endocrinology

## 2021-05-05 NOTE — Telephone Encounter (Signed)
Pt calling in voiced that he is calling back to let the nurse know that he received his medication at [facility] and he was going to let her know that he is ready for the medication to be sent to the mail order pharmacy    [facility] ([facility] - [place], [place]

## 2021-05-28 ENCOUNTER — Other Ambulatory Visit: Payer: Self-pay | Admitting: Family Medicine

## 2021-05-28 DIAGNOSIS — E7800 Pure hypercholesterolemia, unspecified: Secondary | ICD-10-CM

## 2021-06-10 ENCOUNTER — Telehealth: Payer: Self-pay | Admitting: Family Medicine

## 2021-06-10 NOTE — Telephone Encounter (Signed)
I attempted to leave message for patient to call back and schedule Medicare Annual Wellness Visit (AWV) in office. Voice mail was full.  If not able to come in office, please offer to do virtually or by telephone.  Left office number and my jabber [phone].  Last AWV:03/19/2020  Please schedule at anytime with Nurse Health Advisor.

## 2021-07-01 ENCOUNTER — Telehealth: Payer: Self-pay

## 2021-07-01 NOTE — Progress Notes (Signed)
    Chronic Care Management Pharmacy Assistant   Name: Logan Orr  MRN: [id number] DOB: 05-10-1951  Reason for Encounter:Hypertension Disease State Call.  Recent office visits:  No recent Office visit  Recent consult visits:  05/04/2021 Dr.Kumar MD (Endocrinology) No Medication Changes noted,Follow-up in 6 months  Hospital visits:  None in previous 6 months  Medications: Outpatient Encounter Medications as of 07/01/2021  Medication Sig   amLODipine (NORVASC) 10 MG tablet Take 1 tablet (10 mg total) by mouth daily.   atorvastatin (LIPITOR) 20 MG tablet TAKE 1 TABLET EVERY DAY   levothyroxine (SYNTHROID) 125 MCG tablet Take 1 tablet (125 mcg total) by mouth daily before breakfast.   Multiple Vitamins-Minerals (MULTIVITAMIN WITH MINERALS) tablet Take 1 tablet by mouth daily.   PARoxetine (PAXIL) 20 MG tablet Take 1 tablet (20 mg total) by mouth daily.   tamsulosin (FLOMAX) 0.4 MG CAPS capsule TAKE 2 CAPSULES EVERY DAY AFTER SUPPER   vitamin B-12 (CYANOCOBALAMIN) 1000 MCG tablet Take 1 tablet (1,000 mcg total) by mouth daily.   No facility-administered encounter medications on file as of 07/01/2021.   Care Gaps: Tetanus Vaccine Shingrix Vaccine PNA Vaccine Influenza Vaccine Star Rating Drugs: Atorvastatin 20 mg 90 day supply last filled 05/31/2021  Medication Fills gaps: Tamsulosin 0.4 MG 90 day supply last filled 03/18/2021.   Reviewed chart prior to disease state call. Spoke with patient regarding BP  Recent Office Vitals: BP Readings from Last 3 Encounters:  05/04/21 132/90  01/27/21 138/80  01/08/21 (!) 159/77   Pulse Readings from Last 3 Encounters:  05/04/21 90  01/27/21 84  01/08/21 90    Wt Readings from Last 3 Encounters:  05/04/21 225 lb 6.4 oz (102.2 kg)  01/27/21 215 lb 3.2 oz (97.6 kg)  01/07/21 212 lb 15.4 oz (96.6 kg)     Kidney Function Lab Results  Component Value Date/Time   CREATININE 1.30 01/28/2021 02:10 PM   CREATININE 1.29 (H)  01/08/2021 04:18 AM   CREATININE 1.35 (H) 08/03/2018 02:19 PM   GFR 56.19 (L) 01/28/2021 02:10 PM   GFRNONAA >60 01/08/2021 04:18 AM   GFRAA 60 (L) 01/31/2019 10:09 AM    BMP Latest Ref Rng & Units 01/28/2021 01/08/2021 01/05/2021  Glucose 70 - 99 mg/dL 100(H) 156(H) 111(H)  BUN 6 - 23 mg/dL 13 15 13   Creatinine 0.40 - 1.50 mg/dL 1.30 1.29(H) 1.07  BUN/Creat Ratio 6 - 22 (calc) - - -  Sodium 135 - 145 mEq/L 139 138 139  Potassium 3.5 - 5.1 mEq/L 4.0 4.3 3.7  Chloride 96 - 112 mEq/L 104 104 105  CO2 19 - 32 mEq/L 26 25 27   Calcium 8.4 - 10.5 mg/dL 10.0 9.5 9.7    Current antihypertensive regimen:  Amlodipine 10 mg 1 tablet daily What recent interventions/DTPs have been made by any provider to improve Blood Pressure control since last CPP Visit: None ID Any recent hospitalizations or ED visits since last visit with CPP? No  I have attempted without success to contact this patient by phone three times to do his Hypertension Disease State call.Mailbox full 11/10,11/14,11/17.  Adherence Review: Is the patient currently on ACE/ARB medication? No Does the patient have >5 day gap between last estimated fill dates? No   Anderson [place] Clinical Production designer, theatre/television/film [phone]

## 2021-07-22 ENCOUNTER — Other Ambulatory Visit: Payer: Self-pay | Admitting: Family Medicine

## 2021-07-22 DIAGNOSIS — I10 Essential (primary) hypertension: Secondary | ICD-10-CM

## 2021-07-28 ENCOUNTER — Telehealth: Payer: Self-pay | Admitting: Family Medicine

## 2021-07-28 DIAGNOSIS — R3911 Hesitancy of micturition: Secondary | ICD-10-CM

## 2021-07-28 DIAGNOSIS — N401 Enlarged prostate with lower urinary tract symptoms: Secondary | ICD-10-CM

## 2021-07-28 MED ORDER — TAMSULOSIN HCL 0.4 MG PO CAPS: ORAL_CAPSULE | ORAL | 0 refills | Status: DC

## 2021-07-28 NOTE — Telephone Encounter (Signed)
Rx sent in, patient aware  

## 2021-07-30 ENCOUNTER — Ambulatory Visit: Payer: Medicare HMO | Admitting: Family Medicine

## 2021-09-02 ENCOUNTER — Ambulatory Visit (INDEPENDENT_AMBULATORY_CARE_PROVIDER_SITE_OTHER): Payer: Medicare HMO | Admitting: Family Medicine

## 2021-09-02 ENCOUNTER — Encounter: Payer: Self-pay | Admitting: Family Medicine

## 2021-09-02 ENCOUNTER — Other Ambulatory Visit: Payer: Self-pay

## 2021-09-02 VITALS — BP 140/82 | HR 85 | Temp 97.2°F | Ht 69.0 in | Wt 221.0 lb

## 2021-09-02 DIAGNOSIS — R7309 Other abnormal glucose: Secondary | ICD-10-CM | POA: Diagnosis not present

## 2021-09-02 DIAGNOSIS — I10 Essential (primary) hypertension: Secondary | ICD-10-CM

## 2021-09-02 DIAGNOSIS — E7800 Pure hypercholesterolemia, unspecified: Secondary | ICD-10-CM

## 2021-09-02 DIAGNOSIS — H9110 Presbycusis, unspecified ear: Secondary | ICD-10-CM | POA: Diagnosis not present

## 2021-09-02 DIAGNOSIS — M5416 Radiculopathy, lumbar region: Secondary | ICD-10-CM | POA: Diagnosis not present

## 2021-09-02 DIAGNOSIS — E538 Deficiency of other specified B group vitamins: Secondary | ICD-10-CM | POA: Diagnosis not present

## 2021-09-02 DIAGNOSIS — Z789 Other specified health status: Secondary | ICD-10-CM | POA: Diagnosis not present

## 2021-09-02 LAB — HEMOGLOBIN A1C: Hgb A1c MFr Bld: 6.2 % (ref 4.6–6.5)

## 2021-09-02 LAB — BASIC METABOLIC PANEL
BUN: 18 mg/dL (ref 6–23)
CO2: 30 mEq/L (ref 19–32)
Calcium: 9.9 mg/dL (ref 8.4–10.5)
Chloride: 103 mEq/L (ref 96–112)
Creatinine, Ser: 1.51 mg/dL — ABNORMAL HIGH (ref 0.40–1.50)
GFR: 46.75 mL/min — ABNORMAL LOW (ref >60.00)
Glucose, Bld: 97 mg/dL (ref 70–99)
Potassium: 3.8 mEq/L (ref 3.5–5.1)
Sodium: 140 mEq/L (ref 135–145)

## 2021-09-02 LAB — VITAMIN B12: Vitamin B-12: 325 pg/mL (ref 211–911)

## 2021-09-02 LAB — CBC
HCT: 40 % (ref 39.0–52.0)
Hemoglobin: 12.9 g/dL — ABNORMAL LOW (ref 13.0–17.0)
MCHC: 32.2 g/dL (ref 30.0–36.0)
MCV: 90.5 fl (ref 78.0–100.0)
Platelets: 307 10*3/uL (ref 150.0–400.0)
RBC: 4.42 Mil/uL (ref 4.22–5.81)
RDW: 14.3 % (ref 11.5–15.5)
WBC: 4.5 10*3/uL (ref 4.0–10.5)

## 2021-09-02 LAB — LDL CHOLESTEROL, DIRECT: Direct LDL: 113 mg/dL

## 2021-09-02 NOTE — Progress Notes (Signed)
Established Patient Office Visit  Subjective:  Patient ID: Logan Orr, male    DOB: May 26, 1951  Age: 71 y.o. MRN: [id number]  CC:  Chief Complaint  Patient presents with   Follow-up    6 month follow up, pain in left leg come and go.     HPI [facility] presents for follow-up of hypertension, elevated cholesterol, elevated glucose, B12 deficiency, difficulty with hearing and would like to talk to me about alcohol.  Thinks it may be an issue.  He has tried to cut back but has had some difficulty with that.  Usually has 2-3 beers nightly but sometimes more.  Continues to work full-time.  Continues with amlodipine.  Continues with atorvastatin for his cholesterol.  Nonfasting today.  He has had a pain radiating from his lateral gluteal area into his lateral thigh.  No injury.  No particular back pain.  Occasional tingling but denies any weakness.  Past Medical History:  Diagnosis Date   Anginal pain ([place]) 2013   hospital for 5 days in [place]   Dyspnea 2013   Due to lung infection   Hemorrhoids    History of colon polyps    HLD (hyperlipidemia)    Hyperplasia of prostate with lower urinary tract symptoms (LUTS)    Hypertension    Pneumonia 2013   left side infection while in [place]   Prostate cancer [facility]) urologist-- dr bell/  oncologist-- dr Tammi Klippel   dx 11-23-2018  Stage T1c,  3+4   Thyroid nodule    08-03-2018  pt pcp note in epic, left side, ultasound ordered    Wears glasses     Past Surgical History:  Procedure Laterality Date   COLONOSCOPY     CYSTOSCOPY N/A 02/04/2019   Procedure: CYSTOSCOPY;  Surgeon: Lucas Mallow, MD;  Location: [facility];  Service: Urology;  Laterality: N/A;  no seeds seen per Dr Gloriann Loan   EYE SURGERY Bilateral 2020   LACERATION REPAIR  1990s   repair left lower arm complex laceration    PROSTATE BIOPSY  11-23-2018   at dr Gloriann Loan office   [place] N/A 02/04/2019   Procedure: RADIOACTIVE SEED  IMPLANT/BRACHYTHERAPY IMPLANT;  Surgeon: Lucas Mallow, MD;  Location: [facility];  Service: Urology;  Laterality: N/A;  90 seeds   SPACE OAR INSTILLATION N/A 02/04/2019   Procedure: SPACE OAR INSTILLATION;  Surgeon: Lucas Mallow, MD;  Location: [facility];  Service: Urology;  Laterality: N/A;   THYROIDECTOMY N/A 01/07/2021   Procedure: TOTAL THYROIDECTOMY;  Surgeon: Armandina Gemma, MD;  Location: [facility];  Service: General;  Laterality: N/A;    Family History  Adopted: Yes  Problem Relation Age of Onset   Thyroid disease Neg Hx     Social History   Socioeconomic History   Marital status: Single    Spouse name: Not on file   Number of children: 1   Years of education: Not on file   Highest education level: Not on file  Occupational History    Comment: unemployed   Tobacco Use   Smoking status: Never   Smokeless tobacco: Never  Vaping Use   Vaping Use: Never used  Substance and Sexual Activity   Alcohol use: Yes    Alcohol/week: 21.0 standard drinks    Types: 21 Cans of beer per week    Comment: 3-4 beers daily   Drug use: Never   Sexual activity: Yes  Other Topics  Concern   Not on file  Social History Narrative   Not on file   Social Determinants of Health   Financial Resource Strain: Not on file  Food Insecurity: Not on file  Transportation Needs: Not on file  Physical Activity: Not on file  Stress: Not on file  Social Connections: Not on file  Intimate Partner Violence: Not on file    Outpatient Medications Prior to Visit  Medication Sig Dispense Refill   amLODipine (NORVASC) 10 MG tablet TAKE 1 TABLET (10 MG TOTAL) BY MOUTH DAILY. 90 tablet 0   atorvastatin (LIPITOR) 20 MG tablet TAKE 1 TABLET EVERY DAY 90 tablet 3   levothyroxine (SYNTHROID) 125 MCG tablet Take 1 tablet (125 mcg total) by mouth daily before breakfast. 15 tablet 0   Multiple Vitamins-Minerals (MULTIVITAMIN WITH MINERALS) tablet Take 1 tablet by mouth  daily.     PARoxetine (PAXIL) 20 MG tablet Take 1 tablet (20 mg total) by mouth daily. 90 tablet 3   tamsulosin (FLOMAX) 0.4 MG CAPS capsule TAKE 2 CAPSULES EVERY DAY AFTER SUPPER 180 capsule 0   vitamin B-12 (CYANOCOBALAMIN) 1000 MCG tablet Take 1 tablet (1,000 mcg total) by mouth daily. 90 tablet 2   No facility-administered medications prior to visit.    No Known Allergies  ROS Review of Systems  Constitutional:  Negative for chills, diaphoresis, fatigue, fever and unexpected weight change.  HENT:  Positive for hearing loss.   Eyes:  Negative for photophobia and visual disturbance.  Respiratory: Negative.    Cardiovascular: Negative.   Gastrointestinal: Negative.   Endocrine: Negative for polyphagia and polyuria.  Genitourinary: Negative.   Musculoskeletal:  Positive for myalgias. Negative for back pain.  Neurological:  Negative for speech difficulty and weakness.     Objective:    Physical Exam Vitals and nursing note reviewed.  Constitutional:      General: He is not in acute distress.    Appearance: Normal appearance. He is not ill-appearing, toxic-appearing or diaphoretic.  HENT:     Head: Normocephalic and atraumatic.     Right Ear: Tympanic membrane, ear canal and external ear normal.     Left Ear: Tympanic membrane, ear canal and external ear normal.     Mouth/Throat:     Mouth: Mucous membranes are moist.     Pharynx: Oropharynx is clear. No oropharyngeal exudate or posterior oropharyngeal erythema.  Eyes:     General: No scleral icterus.       Right eye: No discharge.        Left eye: No discharge.     Extraocular Movements: Extraocular movements intact.     Conjunctiva/sclera: Conjunctivae normal.     Pupils: Pupils are equal, round, and reactive to light.  Cardiovascular:     Rate and Rhythm: Normal rate and regular rhythm.  Pulmonary:     Effort: Pulmonary effort is normal.     Breath sounds: Normal breath sounds.  Musculoskeletal:     Cervical back:  No rigidity or tenderness.     Lumbar back: No spasms, tenderness or bony tenderness. Normal range of motion. Negative right straight leg raise test and negative left straight leg raise test.     Right hip: Normal.     Left hip: Normal.  Lymphadenopathy:     Cervical: No cervical adenopathy.  Neurological:     Mental Status: He is alert and oriented to person, place, and time.     Motor: No weakness.  Psychiatric:  Mood and Affect: Mood normal.        Behavior: Behavior normal.    BP 140/82 (BP Location: Left Arm, Patient Position: Sitting, Cuff Size: Large)    Pulse 85    Temp (!) 97.2 F (36.2 C) (Temporal)    Ht 5\' 9"  (1.753 m)    Wt 221 lb (100.2 kg)    SpO2 97%    BMI 32.64 kg/m  Wt Readings from Last 3 Encounters:  09/02/21 221 lb (100.2 kg)  05/04/21 225 lb 6.4 oz (102.2 kg)  01/27/21 215 lb 3.2 oz (97.6 kg)     Health Maintenance Due  Topic Date Due   Pneumonia Vaccine 65+ Years old (1 - PCV) Never done   TETANUS/TDAP  Never done   Zoster Vaccines- Shingrix (1 of 2) Never done    There are no preventive care reminders to display for this patient.  Lab Results  Component Value Date   TSH 3.140 04/14/2021   TSH 3.030 04/14/2021   Lab Results  Component Value Date   WBC 3.8 (L) 01/28/2021   HGB 13.6 01/28/2021   HCT 40.9 01/28/2021   MCV 88.3 01/28/2021   PLT 324.0 01/28/2021   Lab Results  Component Value Date   NA 139 01/28/2021   K 4.0 01/28/2021   CO2 26 01/28/2021   GLUCOSE 100 (H) 01/28/2021   BUN 13 01/28/2021   CREATININE 1.30 01/28/2021   BILITOT 0.6 01/28/2021   ALKPHOS 93 01/28/2021   AST 23 01/28/2021   ALT 25 01/28/2021   PROT 7.5 01/28/2021   ALBUMIN 4.6 01/28/2021   CALCIUM 10.0 01/28/2021   ANIONGAP 9 01/08/2021   GFR 56.19 (L) 01/28/2021   Lab Results  Component Value Date   CHOL 190 01/28/2021   Lab Results  Component Value Date   HDL 52.00 01/28/2021   Lab Results  Component Value Date   LDLCALC 199 (H) 08/03/2018    Lab Results  Component Value Date   TRIG 232.0 (H) 01/28/2021   Lab Results  Component Value Date   CHOLHDL 4 01/28/2021   Lab Results  Component Value Date   HGBA1C 6.4 01/28/2021      Assessment & Plan:   Problem List Items Addressed This Visit       Cardiovascular and Mediastinum   Essential hypertension   Relevant Orders   Basic metabolic panel   CBC     Nervous and Auditory   Presbycusis   Relevant Orders   Ambulatory referral to Audiology   Lumbar radiculopathy     Other   Elevated LDL cholesterol level   Relevant Orders   LDL cholesterol, direct   Alcohol use   B12 deficiency - Primary   Relevant Orders   Vitamin B12   Elevated glucose   Relevant Orders   Basic metabolic panel   Hemoglobin A1c    No orders of the defined types were placed in this encounter.   Follow-up: Return in about 3 months (around 12/01/2021), or Go to an [place]. Call [facility] in [place]..   Encourage patient to try AA.  Also doing contact [facility] in [facility] for outpatient treatment options.  Given information on substance abuse.  Would like to have his hearing checked.  We discussed adding metformin. Libby Maw, MD

## 2021-09-03 MED ORDER — ATORVASTATIN CALCIUM 20 MG PO TABS: 20.0000 mg | ORAL_TABLET | Freq: Every day | ORAL | 3 refills | Status: DC

## 2021-09-03 MED ORDER — VITAMIN B-12 1000 MCG PO TABS: 1000.0000 ug | ORAL_TABLET | Freq: Every day | ORAL | 2 refills | Status: DC

## 2021-09-03 MED ORDER — LISINOPRIL 5 MG PO TABS: 5.0000 mg | ORAL_TABLET | Freq: Every day | ORAL | 3 refills | Status: DC

## 2021-09-03 NOTE — Addendum Note (Signed)
Addended by: Jon Billings on: 09/03/2021 07:52 AM   Modules accepted: Orders

## 2021-09-10 DIAGNOSIS — H903 Sensorineural hearing loss, bilateral: Secondary | ICD-10-CM | POA: Diagnosis not present

## 2021-09-23 ENCOUNTER — Telehealth: Payer: Self-pay | Admitting: Family Medicine

## 2021-09-23 NOTE — Telephone Encounter (Signed)
Pt is wanting to know if it's OK for him to get a covid shot?  [phone]

## 2021-09-24 NOTE — Telephone Encounter (Signed)
Please advise message below  °

## 2021-09-27 NOTE — Telephone Encounter (Signed)
Patient aware that it is okay to have covid vaccine.

## 2021-09-28 ENCOUNTER — Ambulatory Visit: Payer: Medicare HMO | Attending: Internal Medicine

## 2021-09-28 ENCOUNTER — Other Ambulatory Visit: Payer: Self-pay

## 2021-09-28 DIAGNOSIS — Z23 Encounter for immunization: Secondary | ICD-10-CM

## 2021-09-28 MED ORDER — PFIZER-BIONT COVID-19 VAC-TRIS 30 MCG/0.3ML IM SUSP
INTRAMUSCULAR | 0 refills | Status: DC
  Filled 2021-09-28: qty 0.3, 1d supply, fill #0

## 2021-09-28 NOTE — Progress Notes (Signed)
° °  Covid-19 Vaccination Clinic  Name:  Hartwell Vandiver    MRN: [id number] DOB: [birth year]  09/28/2021  Mr. Leighty was observed post Covid-19 immunization for 15 minutes without incident. He was provided with Vaccine Information Sheet and instruction to access the V-Safe system.   Mr. Stigger was instructed to call 911 with any severe reactions post vaccine: Difficulty breathing  Swelling of face and throat  A fast heartbeat  A bad rash all over body  Dizziness and weakness   Immunizations Administered     Name Date Dose VIS Date Route   PFIZER Comrnaty(Gray TOP) Covid-19 Vaccine 09/28/2021 10:47 AM 0.3 mL 04/21/2021 Intramuscular   Manufacturer: [place]   Lot: [id number]   NDC: [place], PharmD, MBA Clinical Acute Care Pharmacist

## 2021-10-01 ENCOUNTER — Telehealth: Payer: Self-pay

## 2021-10-01 NOTE — Progress Notes (Signed)
° ° °  Chronic Care Management Pharmacy Assistant   Name: Logan Orr  MRN: [id number] DOB: 1951-04-15  Reason for Encounter: Medication Review/General Adherence Call.   Recent office visits:  09/02/2021 Dr. Ethelene Hal MD (PCP) Start Lisinopril 5 mg daily, Ambulatory referral to Audiology, Return in about 3 months   Recent consult visits:  No recent Consult visit  Hospital visits:  None in previous 6 months  Medications: Outpatient Encounter Medications as of 10/01/2021  Medication Sig   amLODipine (NORVASC) 10 MG tablet TAKE 1 TABLET (10 MG TOTAL) BY MOUTH DAILY.   atorvastatin (LIPITOR) 20 MG tablet Take 1 tablet (20 mg total) by mouth daily.   COVID-19 mRNA Vac-TriS, Pfizer, (PFIZER-BIONT COVID-19 VAC-TRIS) SUSP injection Inject into the muscle.   levothyroxine (SYNTHROID) 125 MCG tablet Take 1 tablet (125 mcg total) by mouth daily before breakfast.   lisinopril (ZESTRIL) 5 MG tablet Take 1 tablet (5 mg total) by mouth daily.   Multiple Vitamins-Minerals (MULTIVITAMIN WITH MINERALS) tablet Take 1 tablet by mouth daily.   PARoxetine (PAXIL) 20 MG tablet Take 1 tablet (20 mg total) by mouth daily.   tamsulosin (FLOMAX) 0.4 MG CAPS capsule TAKE 2 CAPSULES EVERY DAY AFTER SUPPER   vitamin B-12 (CYANOCOBALAMIN) 1000 MCG tablet Take 1 tablet (1,000 mcg total) by mouth daily.   No facility-administered encounter medications on file as of 10/01/2021.    Care Gaps: Tetanus Vaccine Shingrix Vaccine PNA Vaccine HTN: 140/82 on 09/02/2021  Star Rating Drugs: Atorvastatin 20 mg 90 day supply last filled 08/07/2021 90 day supply at [facility]. Lisinopril 90 day supply last filled 09/03/2021 90 day supply at [facility]. Medication Fills gaps: None ID   I have attempted without success to contact this patient by phone three times to do his general adherence call. I left a Voice message for patient to return my call.   [place] Pharmacist  Assistant [phone]

## 2021-10-02 ENCOUNTER — Other Ambulatory Visit (HOSPITAL_COMMUNITY): Payer: Self-pay

## 2021-10-02 IMAGING — US US THYROID
1 series · 12 of 25 positions shown · non-contrast
Comparison: None.

CLINICAL DATA: Palpable abnormality. Thyromegaly on physical
examination.

EXAM:
THYROID ULTRASOUND
TECHNIQUE: Ultrasound examination of the thyroid gland and adjacent soft
tissues was performed.

[Series 1: us thyroid · 0.07mm/px · 12 of 50 slices shown]
[im 3/50]
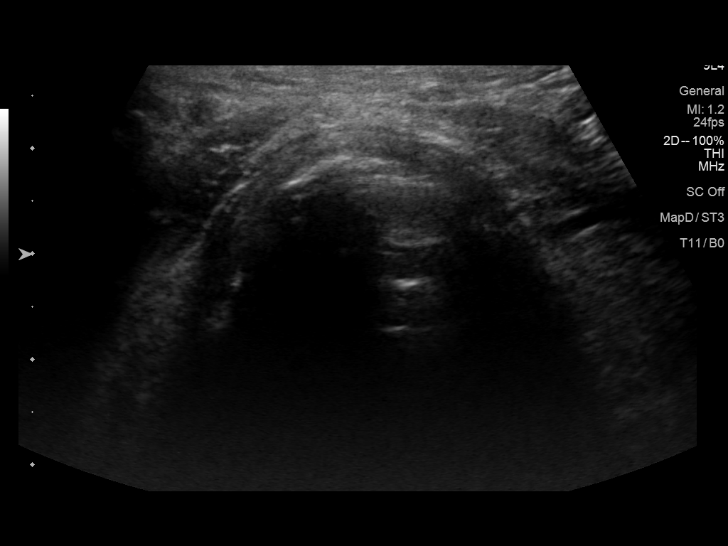
[im 7/50]
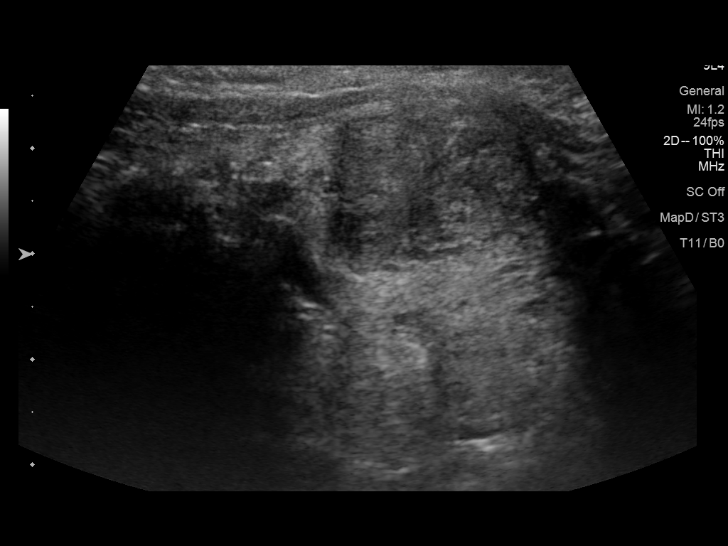
[im 11/50]
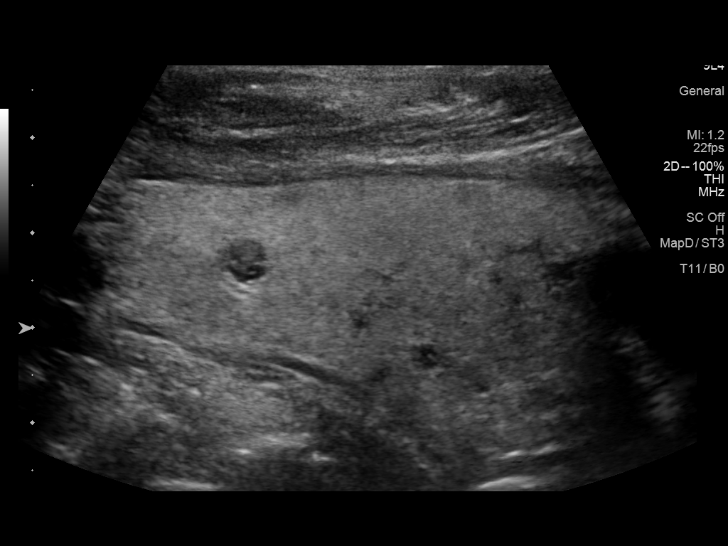
[im 15/50]
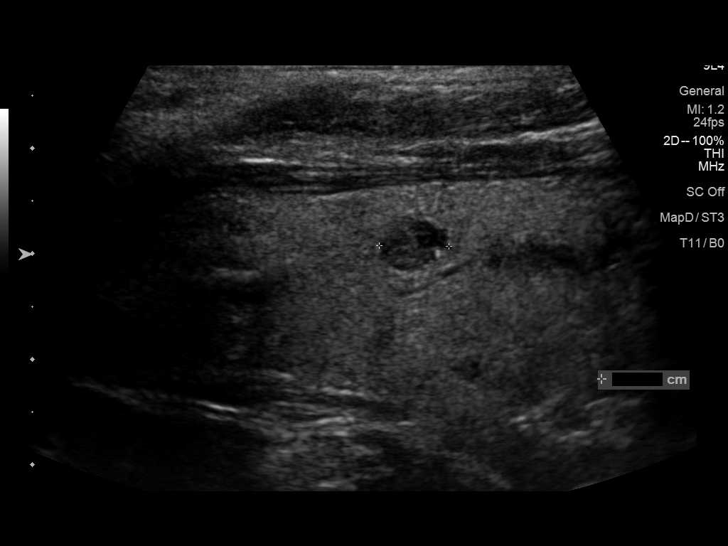
[im 19/50]
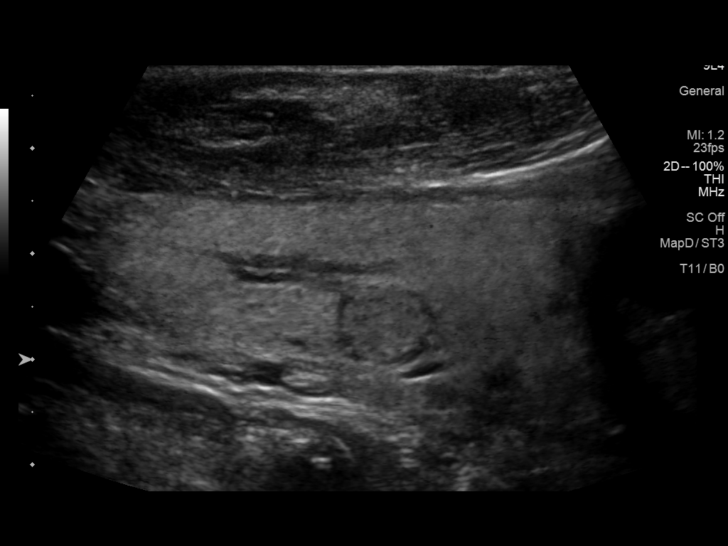
[im 23/50]
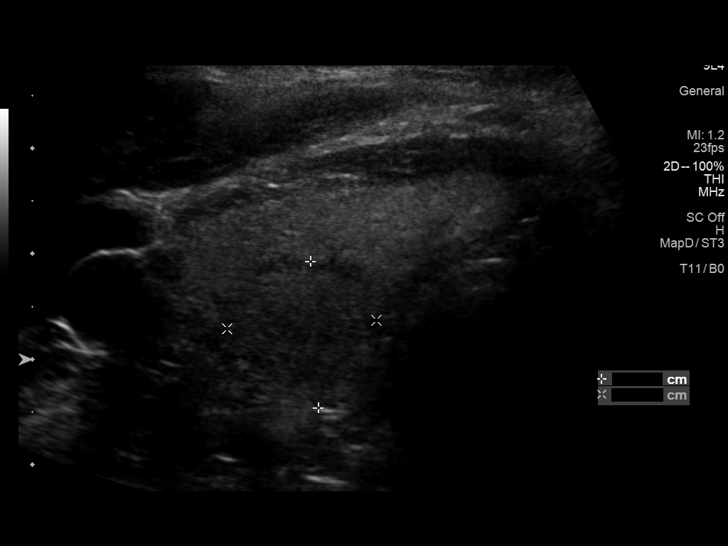
[im 27/50]
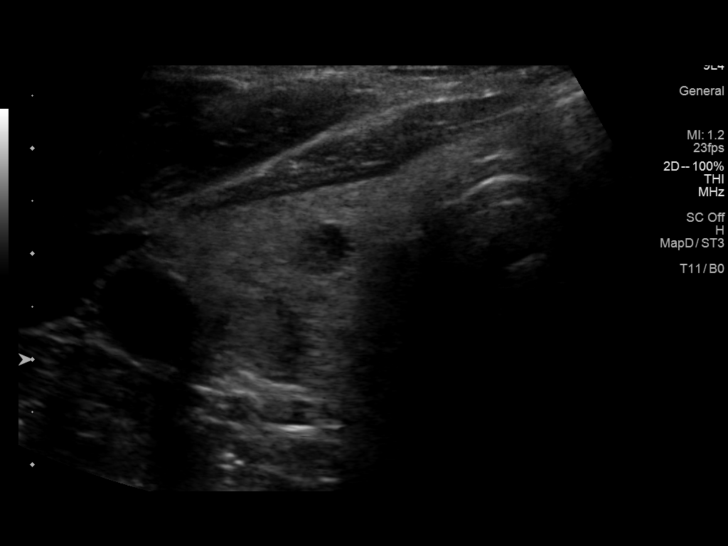
[im 31/50]
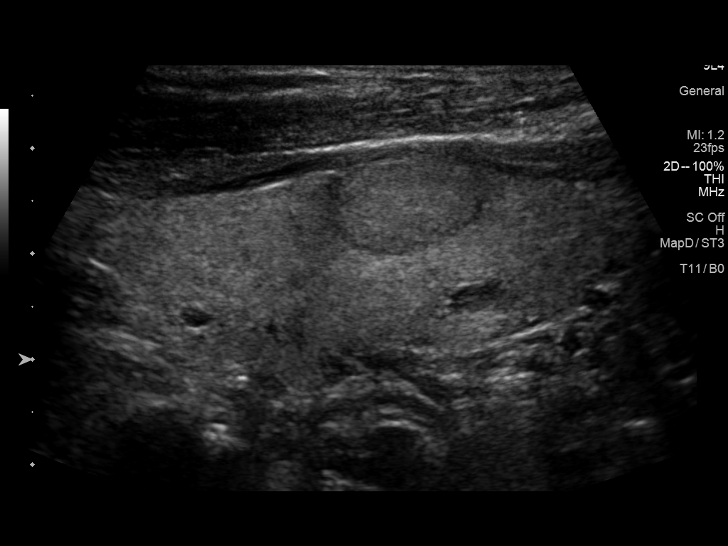
[im 35/50]
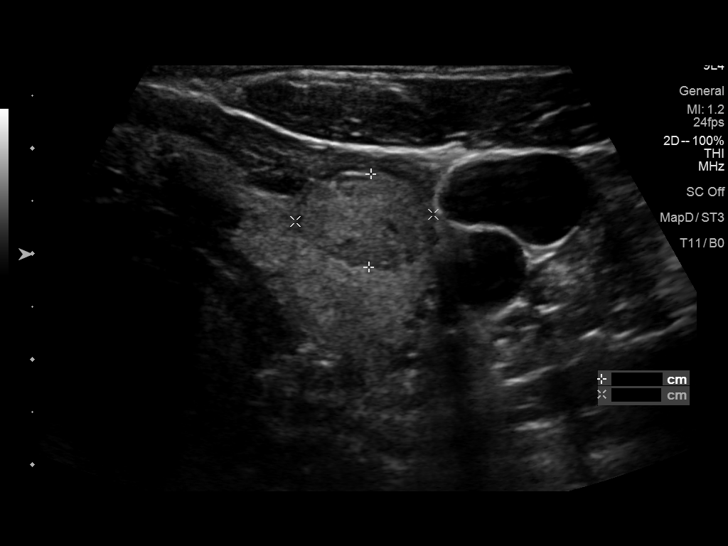
[im 39/50]
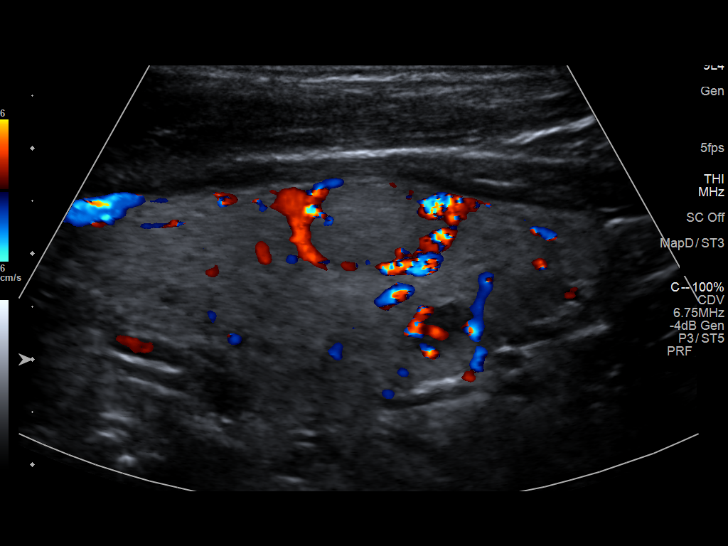
[im 43/50]
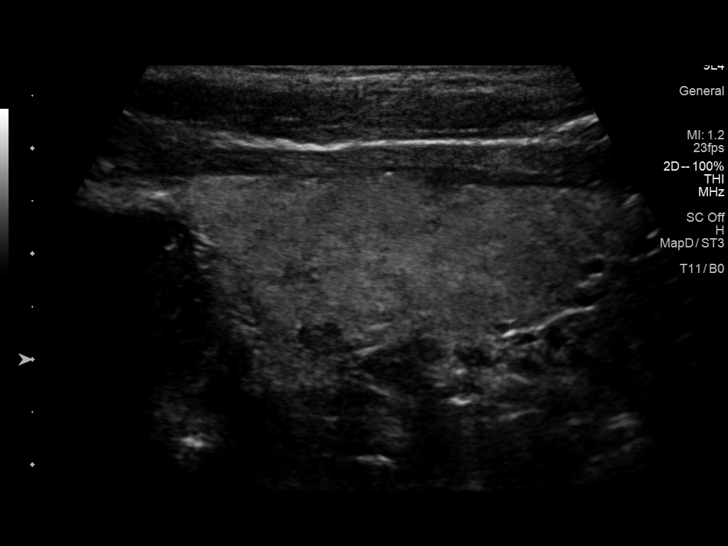
[im 47/50]
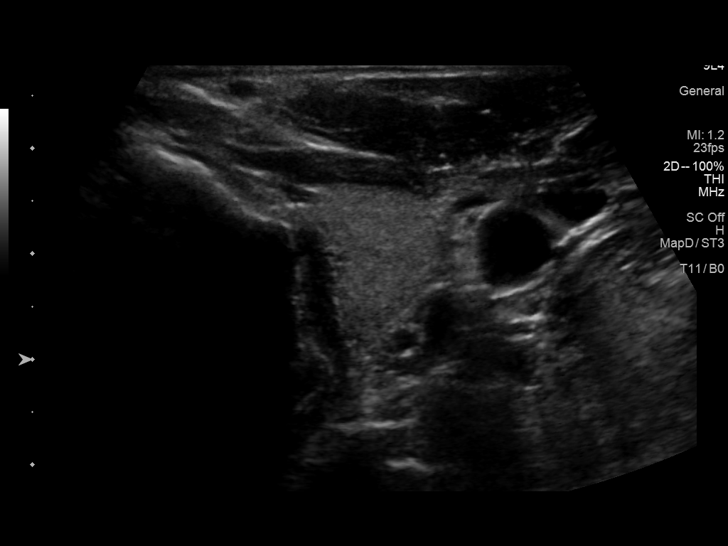

[12 of 25 positions shown; findings below may reference images not displayed]

FINDINGS: Parenchymal Echotexture: Mildly heterogenous

Isthmus: Normal in size measuring 0.7 cm in diameter

Right lobe: Borderline enlarged measuring 5.8 x 2.9 x 2.5 cm

Left lobe: Normal in size measuring 5.5 x 1.9 x 1.9 cm

_________________________________________________________

Estimated total number of nodules >/= 1 cm: 3

Number of spongiform nodules >/=  2 cm not described below (TR1): 0

Number of mixed cystic and solid nodules >/= 1.5 cm not described
below (TR2): 0

_________________________________________________________

Nodule # 1:

Location: Isthmus; Mid

Maximum size: 2.0 cm; Other 2 dimensions: 1.6 x 1.5 cm

Composition: solid/almost completely solid (2)

Echogenicity: hypoechoic (2)

Shape: not taller-than-wide (0)

Margins: lobulated/irregular (2)

Echogenic foci: none (0)

ACR TI-RADS total points: 6.

ACR TI-RADS risk category: TR4 (4-6 points).

ACR TI-RADS recommendations:

**Given size (>/= 1.5 cm) and appearance, fine needle aspiration of
this moderately suspicious nodule should be considered based on
TI-RADS criteria.

_________________________________________________________

There is a punctate (approximately 0.7 cm) hypoechoic nodule within
mid aspect the right lobe of the thyroid (labeled 2) which contains
an internal echogenic foci ring under artifact compatible with
benign colloid. This benign colloid containing nodule does not meet
criteria to recommend percutaneous sampling or continued dedicated
follow-up.

_________________________________________________________

There is an approximately 0.9 cm isoechoic ill-defined
nodule/pseudonodule with the mid, posterior aspect of the right lobe
of the thyroid (labeled 3), which does not meet criteria to
recommend percutaneous sampling or continued dedicated follow-up

_________________________________________________________

Nodule # 4:

Location: Right; Inferior

Maximum size: 1.6 cm; Other 2 dimensions: 1.4 x 1.4 cm

Composition: solid/almost completely solid (2)

Echogenicity: hypoechoic (2)

Shape: not taller-than-wide (0)

Margins: smooth (0)

Echogenic foci: none (0)

ACR TI-RADS total points: 4.

ACR TI-RADS risk category: TR4 (4-6 points).

ACR TI-RADS recommendations:

**Given size (>/= 1.5 cm) and appearance, fine needle aspiration of
this moderately suspicious nodule should be considered based on
TI-RADS criteria.

_________________________________________________________

Nodule # 5:

Location: Left; Mid

Maximum size: 1.5 cm; Other 2 dimensions: 1.3 x 0.9 cm

Composition: solid/almost completely solid (2)

Echogenicity: isoechoic (1)

Shape: not taller-than-wide (0)

Margins: smooth (0)

Echogenic foci: none (0)

ACR TI-RADS total points: 3.

ACR TI-RADS risk category: TR3 (3 points).

ACR TI-RADS recommendations:

*Given size (>/= 1.5 - 2.4 cm) and appearance, a follow-up
ultrasound in 1 year should be considered based on TI-RADS criteria.

_________________________________________________________

There is an approximately 0.7 cm hypoechoic nodule within the
inferior pole of the left lobe of the thyroid (labeled 6), which
contains an internal echogenic foci ring down artifact compatible
with benign colloid. This benign colloid containing nodule does not
meet criteria to recommend percutaneous sampling or continued
dedicated follow-up.
IMPRESSION: 1. Borderline thyromegaly with findings suggestive of multinodular
goiter.
2. Nodules #1 and #4 both meet imaging criteria to recommend
percutaneous sampling as indicated.
3. Nodule #5 meets imaging criteria to recommend a 1 year follow-up.

The above is in keeping with the ACR TI-RADS recommendations - [HOSPITAL] 0119;[DATE].

## 2021-10-19 ENCOUNTER — Ambulatory Visit: Payer: Self-pay

## 2021-11-02 ENCOUNTER — Ambulatory Visit: Payer: Medicare HMO | Admitting: Endocrinology

## 2021-11-02 NOTE — Progress Notes (Incomplete)
Patient ID: Logan Orr, male   DOB: 07-28-1951, 71 y.o.   MRN: [id number] ?       ? ? ?Reason for Appointment: Follow-up of thyroid ? ? ? History of Present Illness:  ? ?He is finally coming back for follow-up after his thyroidectomy in 12/2020 ? ?He has been taking levothyroxine 125 mcg daily but has not had any follow-up thyroid labs since after his surgery ?Although his tumor was only 1.8 cm in the left isthmus he has had a total thyroidectomy, no thyroid nodules were seen on the pathology on the right side ?Pathology report was reviewed and is as below ? ?Since his surgery he has not complained of any unusual fatigue but every couple of days he may feel a little tired ?He has taken his levothyroxine before eating in the morning ? ?Currently TSH is just above 3.0 ? ?Pathology report: Tumor Site: Mid left lobe, near the isthmus  ?Tumor Size: Greatest dimension: 1.8 cm  ?Histologic Type: Papillary carcinoma  ?Angioinvasion: Not identified  ?Lymphatic Invasion: Not identified  ?Extrathyroidal Extension: Not identified  ?Margin Status: All margins negative for invasive carcinoma  ?Regional Lymph Node Status: Not applicable ? ?BACKGROUND history: ?The patient's thyroid enlargement was first discovered in 04/2020 on a routine exam by his PCP ?Thyroid ultrasound showed a 2 cm left thyroid nodule with TI-RADS 6 score based on irregular margins ? ?His thyroid biopsy was finally done on 09/09/2020 and showed Bethesda 5 cytology suspicious for malignancy although scant cellularity reported ? ? ?Lab Results  ?Component Value Date  ? FREET4 1.22 04/14/2021  ? FREET4 1.21 04/14/2021  ? TSH 3.140 04/14/2021  ? TSH 3.030 04/14/2021  ? TSH 0.72 06/03/2020  ? ?Thyroglobulin level as follows ? ?Lab Results  ?Component Value Date  ? THYROGLB 2.5 04/14/2021  ? ? ?Previous ultrasound exam in 04/2020 showed ? ?Nodule #1 location: Isthmus; Mid ?  ?Maximum size: 2.0 cm; Other 2 dimensions: 1.6 x 1.5 cm ?  ? ? ? ?Allergies as of 11/02/2021    ?No Known Allergies ?  ? ?  ?Medication List  ?  ? ?  ? Accurate as of November 02, 2021  8:11 AM. If you have any questions, ask your nurse or doctor.  ?  ?  ? ?  ? ?amLODipine 10 MG tablet ?Commonly known as: NORVASC ?TAKE 1 TABLET (10 MG TOTAL) BY MOUTH DAILY. ?  ?atorvastatin 20 MG tablet ?Commonly known as: LIPITOR ?Take 1 tablet (20 mg total) by mouth daily. ?  ?levothyroxine 125 MCG tablet ?Commonly known as: Synthroid ?Take 1 tablet (125 mcg total) by mouth daily before breakfast. ?  ?lisinopril 5 MG tablet ?Commonly known as: ZESTRIL ?Take 1 tablet (5 mg total) by mouth daily. ?  ?multivitamin with minerals tablet ?Take 1 tablet by mouth daily. ?  ?PARoxetine 20 MG tablet ?Commonly known as: PAXIL ?Take 1 tablet (20 mg total) by mouth daily. ?  ?Pfizer-BioNT COVID-19 Vac-TriS Susp injection ?Generic drug: COVID-19 mRNA Vac-TriS [employer]) ?Inject into the muscle. ?  ?tamsulosin 0.4 MG Caps capsule ?Commonly known as: FLOMAX ?TAKE 2 CAPSULES EVERY DAY AFTER SUPPER ?  ?vitamin B-12 1000 MCG tablet ?Commonly known as: CYANOCOBALAMIN ?Take 1 tablet (1,000 mcg total) by mouth daily. ?  ? ?  ? ? ?Allergies: No Known Allergies ? ?Past Medical History:  ?Diagnosis Date  ?? Anginal pain ([place]) 2013  ? hospital for 5 days in [place]  ?? Dyspnea 2013  ? Due to lung infection  ??  Hemorrhoids   ?? History of colon polyps   ?? HLD (hyperlipidemia)   ?? Hyperplasia of prostate with lower urinary tract symptoms (LUTS)   ?? Hypertension   ?? Pneumonia 2013  ? left side infection while in [place]  ?? Prostate cancer [facility]) urologist-- dr bell/  oncologist-- dr Tammi Klippel  ? dx 11-23-2018  Stage T1c,  3+4  ?? Thyroid nodule   ? 08-03-2018  pt pcp note in epic, left side, ultasound ordered   ?? Wears glasses   ? ? ?Past Surgical History:  ?Procedure Laterality Date  ?? COLONOSCOPY    ?? CYSTOSCOPY N/A 02/04/2019  ? Procedure: CYSTOSCOPY;  Surgeon: Lucas Mallow, MD;  Location: [facility];  Service: Urology;   Laterality: N/A;  no seeds seen per Dr Gloriann Loan  ?? EYE SURGERY Bilateral 2020  ?? LACERATION REPAIR  1990s  ? repair left lower arm complex laceration   ?? PROSTATE BIOPSY  11-23-2018   at dr Gloriann Loan office  ?? RADIOACTIVE SEED IMPLANT N/A 02/04/2019  ? Procedure: RADIOACTIVE SEED IMPLANT/BRACHYTHERAPY IMPLANT;  Surgeon: Lucas Mallow, MD;  Location: [facility];  Service: Urology;  Laterality: N/A;  90 seeds  ?? SPACE OAR INSTILLATION N/A 02/04/2019  ? Procedure: SPACE OAR INSTILLATION;  Surgeon: Lucas Mallow, MD;  [facility]  Service: Urology;  Laterality: N/A;  ?? THYROIDECTOMY N/A 01/07/2021  ? Procedure: TOTAL THYROIDECTOMY;  Surgeon: Armandina Gemma, MD;  Location: [facility];  Service: General;  Laterality: N/A;  ? ? ?Family History  ?Adopted: Yes  ?Problem Relation Age of Onset  ?? Thyroid disease Neg Hx   ? ? ?Social History:  reports that he has never smoked. He has never used smokeless tobacco. He reports current alcohol use of about 21.0 standard drinks per week. He reports that he does not use drugs. ? ? ?Review of Systems ? ?He has had variable blood pressure readings, on Norvasc for HYPERTENSION ? ?BP Readings from Last 3 Encounters:  ?09/02/21 140/82  ?05/04/21 132/90  ?01/27/21 138/80  ? ?He is asking about right leg numbness at times in the upper part ? ? Examination: ?  ?There were no vitals taken for this visit. ? ?His voice is not hoarse ? ?Scar of thyroidectomy present ? ?Thyroid exam: ?No mass palpable in the thyroid area ?No lymphadenopathy in the neck area ?Deep tendon reflexes are difficult to elicit ?No ankle edema ? ? ?Assessment/Plan: ? ?Recently diagnosed papillary thyroid cancer with 1.8 cm tumor and no invasive features or local spread ?Surgery was done in 5/22 and he had no sequelae of any parathyroid or laryngeal nerve issues ? ?Discussed the diagnosis of his tumor, prognosis and principles of treatment and follow-up ?Given his low-grade tumor he  does not need any I-131 remnant ablation and this was explained ? ?His thyroglobulin is only 2.5 indicating only a small amount of residual thyroid tissue post thyroidectomy ? ?Since his hypothyroidism needs to be adequately controlled and TSH is over 3.0 he will add an extra half a tablet weekly on his levothyroxine 125 mcg ? ?Recommended that he follow-up with his PCP regarding left upper leg numbness symptoms ? ?Follow-up in 6 months ? ?Total visit time for evaluation and management time counseling = 30 minutes ? ?[name] ?11/02/2021 ? ? ? ? ?

## 2021-12-06 ENCOUNTER — Ambulatory Visit: Payer: Medicare HMO | Admitting: Family Medicine

## 2022-01-04 ENCOUNTER — Encounter: Payer: Self-pay | Admitting: Family Medicine

## 2022-01-04 ENCOUNTER — Ambulatory Visit (INDEPENDENT_AMBULATORY_CARE_PROVIDER_SITE_OTHER): Payer: Medicare HMO | Admitting: Family Medicine

## 2022-01-04 VITALS — BP 126/70 | HR 85 | Temp 97.0°F | Ht 69.0 in | Wt 230.4 lb

## 2022-01-04 DIAGNOSIS — I10 Essential (primary) hypertension: Secondary | ICD-10-CM | POA: Diagnosis not present

## 2022-01-04 DIAGNOSIS — G5710 Meralgia paresthetica, unspecified lower limb: Secondary | ICD-10-CM | POA: Diagnosis not present

## 2022-01-04 DIAGNOSIS — R7309 Other abnormal glucose: Secondary | ICD-10-CM

## 2022-01-04 DIAGNOSIS — E538 Deficiency of other specified B group vitamins: Secondary | ICD-10-CM

## 2022-01-04 DIAGNOSIS — Z789 Other specified health status: Secondary | ICD-10-CM

## 2022-01-04 NOTE — Progress Notes (Signed)
? ?Established Patient Office Visit ? ?Subjective   ?Patient ID: Logan Orr, male    DOB: 03-13-1951  Age: 71 y.o. MRN: [id number] ? ?Chief Complaint  ?Patient presents with  ? Follow-up  ?  Routine follow up, states that legs sometimes feel stiff.   ? ? ?HPI follow-up of paresthesias he has been experiencing in the left lateral hip area as well as the right lateral hip area.  Symptoms are far greater than.  Denies any particular back pain, paresthesias in his groin area, changes in bowel or bladder function or weakness in his lower extremities.  Compliant with his high-dose vitamin B12.  has greatly moderated his alcohol use daily.  Some days he does not drink at all.  Blood pressure well controlled with amlodipine. ? ? ? ?Review of Systems  ?Constitutional: Negative.   ?HENT: Negative.    ?Eyes:  Negative for blurred vision, discharge and redness.  ?Respiratory: Negative.    ?Cardiovascular: Negative.   ?Gastrointestinal:  Negative for abdominal pain.  ?Genitourinary: Negative.   ?Musculoskeletal: Negative.  Negative for back pain and myalgias.  ?Skin:  Negative for rash.  ?Neurological:  Positive for tingling. Negative for focal weakness, loss of consciousness and weakness.  ?Endo/Heme/Allergies:  Negative for polydipsia.  ? ?  ?Objective:  ?  ? ?BP 126/70 (BP Location: Left Arm, Patient Position: Sitting, Cuff Size: Large)   Pulse 85   Temp (!) 97 ?F (36.1 ?C) (Temporal)   Ht '5\' 9"'$  (1.753 m)   Wt 230 lb 6.4 oz (104.5 kg)   SpO2 95%   BMI 34.02 kg/m?  ?BP Readings from Last 3 Encounters:  ?01/04/22 126/70  ?09/02/21 140/82  ?05/04/21 132/90  ? ?Wt Readings from Last 3 Encounters:  ?01/04/22 230 lb 6.4 oz (104.5 kg)  ?09/02/21 221 lb (100.2 kg)  ?05/04/21 225 lb 6.4 oz (102.2 kg)  ? ?  ? ?Physical Exam ?Constitutional:   ?   General: He is not in acute distress. ?   Appearance: Normal appearance. He is not ill-appearing, toxic-appearing or diaphoretic.  ?HENT:  ?   Head: Normocephalic and atraumatic.  ?    Right Ear: External ear normal.  ?   Left Ear: External ear normal.  ?Eyes:  ?   General: No scleral icterus.    ?   Right eye: No discharge.     ?   Left eye: No discharge.  ?   Extraocular Movements: Extraocular movements intact.  ?   Conjunctiva/sclera: Conjunctivae normal.  ?Pulmonary:  ?   Effort: Pulmonary effort is normal. No respiratory distress.  ?Musculoskeletal:  ?   Lumbar back: No tenderness or bony tenderness. Normal range of motion. Negative right straight leg raise test and negative left straight leg raise test.  ?Skin: ?   General: Skin is warm and dry.  ?Neurological:  ?   Mental Status: He is alert and oriented to person, place, and time.  ?   Motor: No weakness.  ?Psychiatric:     ?   Mood and Affect: Mood normal.     ?   Behavior: Behavior normal.  ? ? ? ?No results found for any visits on 01/04/22. ? ? ? ?The 10-year ASCVD risk score (Arnett DK, et al., 2019) is: 29.1% ? ?  ?Assessment & Plan:  ? ?Problem List Items Addressed This Visit   ? ?  ? Cardiovascular and Mediastinum  ? Essential hypertension  ?  ? Nervous and Auditory  ? Meralgia paresthetica - Primary  ?  ?  Other  ? Alcohol use  ? B12 deficiency  ? Relevant Orders  ? Vitamin B12  ? Elevated glucose  ? ? ?Return in about 3 months (around 04/06/2022).  ?Continue moderation of alcohol. Will check B12 level.  Patient aware that low B12 could be a cause of his symptoms.  Red flag symptoms discussed for immediate follow-up otherwise in 3 months. ? ?Libby Maw, MD ? ?

## 2022-01-05 LAB — VITAMIN B12: Vitamin B-12: 501 pg/mL (ref 211–911)

## 2022-01-20 ENCOUNTER — Other Ambulatory Visit: Payer: Self-pay

## 2022-03-08 ENCOUNTER — Telehealth: Payer: Self-pay | Admitting: Family Medicine

## 2022-03-08 NOTE — Telephone Encounter (Signed)
Left message for patient to call back and schedule Medicare Annual Wellness Visit (AWV).   Please offer to do virtually or by telephone.  Left office number and my jabber [phone].  Last AWV:03/19/2020  Please schedule at anytime with Nurse Health Advisor.

## 2022-03-22 ENCOUNTER — Ambulatory Visit: Payer: Medicare HMO

## 2022-03-23 ENCOUNTER — Ambulatory Visit: Payer: Medicare HMO | Admitting: Family Medicine

## 2022-03-30 ENCOUNTER — Telehealth: Payer: Self-pay | Admitting: Family Medicine

## 2022-03-30 NOTE — Telephone Encounter (Signed)
Left message for patient to call back and schedule Medicare Annual Wellness Visit (AWV).   Please offer to do virtually or by telephone.  Left office number and my jabber [phone].  Last AWV:03/19/2020  Please schedule at anytime with Nurse Health Advisor.

## 2022-04-05 ENCOUNTER — Telehealth: Payer: Self-pay

## 2022-04-05 NOTE — Telephone Encounter (Signed)
Called patient x3 with no answer no voice mail, Patient may reschedule for the next available appointment.  L.Wilson,LPN

## 2022-04-07 ENCOUNTER — Other Ambulatory Visit: Payer: Self-pay | Admitting: Family Medicine

## 2022-04-07 DIAGNOSIS — I10 Essential (primary) hypertension: Secondary | ICD-10-CM

## 2022-04-11 NOTE — Telephone Encounter (Signed)
Left message for patient to call back and schedule Medicare Annual Wellness Visit (AWV).   Please offer to do virtually or by telephone.  Left office number and my jabber [phone].  Last AWV:03/19/2020  Please schedule at anytime with Nurse Health Advisor.

## 2022-05-02 ENCOUNTER — Other Ambulatory Visit: Payer: Self-pay | Admitting: Family Medicine

## 2022-05-02 DIAGNOSIS — E538 Deficiency of other specified B group vitamins: Secondary | ICD-10-CM

## 2022-05-17 ENCOUNTER — Ambulatory Visit: Payer: Medicare HMO | Admitting: Family Medicine

## 2022-05-17 ENCOUNTER — Ambulatory Visit (INDEPENDENT_AMBULATORY_CARE_PROVIDER_SITE_OTHER): Payer: Medicare HMO

## 2022-05-17 VITALS — Ht 69.0 in | Wt 220.0 lb

## 2022-05-17 DIAGNOSIS — Z0000 Encounter for general adult medical examination without abnormal findings: Secondary | ICD-10-CM

## 2022-05-17 DIAGNOSIS — Z1211 Encounter for screening for malignant neoplasm of colon: Secondary | ICD-10-CM

## 2022-05-17 NOTE — Progress Notes (Signed)
Subjective:   Logan Orr is a 71 y.o. male who presents for Medicare Annual/Subsequent preventive examination.   Virtual Visit via Telephone Note  I connected with  Logan Orr on 05/17/22 at 11:00 AM EDT by telephone and verified that I am speaking with the correct person using two identifiers.  Location: Patient: home  Provider: [place]  Persons participating in the virtual visit: patient/Nurse Health Advisor   I discussed the limitations, risks, security and privacy concerns of performing an evaluation and management service by telephone and the availability of in person appointments. The patient expressed understanding and agreed to proceed.  Interactive audio and video telecommunications were attempted between this nurse and patient, however failed, due to patient having technical difficulties OR patient did not have access to video capability.  We continued and completed visit with audio only.  Some vital signs may be absent or patient reported.   Daphane Shepherd, LPN  Review of Systems     Cardiac Risk Factors include: advanced age (>36mn, >>92women);hypertension;male gender     Objective:    Today's Vitals   05/17/22 1106  Weight: 220 lb (99.8 kg)  Height: '5\' 9"'$  (1.753 m)   Body mass index is 32.49 kg/m.     05/17/2022   11:10 AM 01/07/2021    2:46 PM 01/05/2021   10:32 AM 03/19/2020    3:06 PM 03/05/2019    8:13 AM 02/04/2019    8:47 AM  Advanced Directives  Does Patient Have a Medical Advance Directive? No No No No No No  Would patient like information on creating a medical advance directive? No - Patient declined No - Patient declined No - Patient declined No - Patient declined No - Patient declined No - Patient declined    Current Medications (verified) Outpatient Encounter Medications as of 05/17/2022  Medication Sig   amLODipine (NORVASC) 10 MG tablet TAKE 1 TABLET EVERY DAY   atorvastatin (LIPITOR) 20 MG tablet Take 1 tablet (20 mg total) by  mouth daily.   cyanocobalamin (VITAMIN B12) 1000 MCG tablet TAKE 1 TABLET EVERY DAY   levothyroxine (SYNTHROID) 125 MCG tablet Take 1 tablet (125 mcg total) by mouth daily before breakfast.   lisinopril (ZESTRIL) 5 MG tablet Take 1 tablet (5 mg total) by mouth daily.   Multiple Vitamins-Minerals (MULTIVITAMIN WITH MINERALS) tablet Take 1 tablet by mouth daily.   PARoxetine (PAXIL) 20 MG tablet Take 1 tablet (20 mg total) by mouth daily.   tamsulosin (FLOMAX) 0.4 MG CAPS capsule TAKE 2 CAPSULES EVERY DAY AFTER SUPPER   No facility-administered encounter medications on file as of 05/17/2022.    Allergies (verified) Patient has no known allergies.   History: Past Medical History:  Diagnosis Date   Anginal pain ([facility] 2013   hospital [place]  Dyspnea 2013   Due to lung infection   Hemorrhoids    History of colon polyps    HLD (hyperlipidemia)    Hyperplasia of prostate with lower urinary tract symptoms (LUTS)    Hypertension    Pneumonia 2013   left side infection while in [place]  Prostate cancer ([facility] urologist-- dr bell/  oncologist-- dr mTammi Klippel  dx 11-23-2018  Stage T1c,  3+4   Thyroid nodule    08-03-2018  pt pcp note in epic, left side, ultasound ordered    Wears glasses    Past Surgical History:  Procedure Laterality Date   COLONOSCOPY     CYSTOSCOPY N/A 02/04/2019  Procedure: CYSTOSCOPY;  Surgeon: Lucas Mallow, MD;  Location: [facility];  Service: Urology;  Laterality: N/A;  no seeds seen per Dr Gloriann Loan   EYE SURGERY Bilateral 2020   LACERATION REPAIR  1990s   repair left lower arm complex laceration    PROSTATE BIOPSY  11-23-2018   at dr Gloriann Loan office   [place] N/A 02/04/2019   Procedure: RADIOACTIVE SEED IMPLANT/BRACHYTHERAPY IMPLANT;  Surgeon: Lucas Mallow, MD;  Location: [facility];  Service: Urology;  Laterality: N/A;  90 seeds   SPACE OAR INSTILLATION N/A 02/04/2019   Procedure: SPACE OAR  INSTILLATION;  Surgeon: Lucas Mallow, MD;  Location: [facility];  Service: Urology;  Laterality: N/A;   THYROIDECTOMY N/A 01/07/2021   Procedure: TOTAL THYROIDECTOMY;  Surgeon: Armandina Gemma, MD;  Location: [facility];  Service: General;  Laterality: N/A;   Family History  Adopted: Yes  Problem Relation Age of Onset   Thyroid disease Neg Hx    Social History   Socioeconomic History   Marital status: Single    Spouse name: Not on file   Number of children: 1   Years of education: Not on file   Highest education level: Not on file  Occupational History    Comment: unemployed   Tobacco Use   Smoking status: Never   Smokeless tobacco: Never  Vaping Use   Vaping Use: Never used  Substance and Sexual Activity   Alcohol use: Yes    Alcohol/week: 21.0 standard drinks of alcohol    Types: 21 Cans of beer per week    Comment: 3-4 beers daily   Drug use: Never   Sexual activity: Yes  Other Topics Concern   Not on file  Social History Narrative   Not on file   Social Determinants of Health   Financial Resource Strain: Low Risk  (05/17/2022)   Overall Financial Resource Strain (CARDIA)    Difficulty of Paying Living Expenses: Not hard at all  Food Insecurity: No Food Insecurity (05/17/2022)   Hunger Vital Sign    Worried About Running Out of Food in the Last Year: Never true    Ran Out of Food in the Last Year: Never true  Transportation Needs: No Transportation Needs (05/17/2022)   PRAPARE - Hydrologist (Medical): No    Lack of Transportation (Non-Medical): No  Physical Activity: Sufficiently Active (05/17/2022)   Exercise Vital Sign    Days of Exercise per Week: 7 days    Minutes of Exercise per Session: 30 min  Stress: No Stress Concern Present (05/17/2022)   [place]    Feeling of Stress : Not at all  Social Connections: Socially Isolated (05/17/2022)   Social  Connection and Isolation Panel [NHANES]    Frequency of Communication with Friends and Family: More than three times a week    Frequency of Social Gatherings with Friends and Family: More than three times a week    Attends Religious Services: Never    Marine scientist or Organizations: No    Attends Music therapist: Never    Marital Status: Divorced    Tobacco Counseling Counseling given: Not Answered   Clinical Intake:  Pre-visit preparation completed: Yes  Pain : No/denies pain     Nutritional Risks: None Diabetes: No  How often do you need to have someone help you when you read  instructions, pamphlets, or other written materials from your doctor or pharmacy?: 1 - Never  Diabetic?no   Interpreter Needed?: No  Information entered by :: Jadene Pierini, LPN   Activities of Daily Living    05/17/2022   11:10 AM  In your present state of health, do you have any difficulty performing the following activities:  Hearing? 0  Vision? 0  Difficulty concentrating or making decisions? 0  Walking or climbing stairs? 0  Dressing or bathing? 0  Doing errands, shopping? 0  Preparing Food and eating ? N  Using the Toilet? N  In the past six months, have you accidently leaked urine? N  Do you have problems with loss of bowel control? N  Managing your Medications? N  Managing your Finances? N  Housekeeping or managing your Housekeeping? N    Patient Care Team: Libby Maw, MD as PCP - General (Family Medicine) Cira Rue, RN Nurse Navigator as Registered Nurse ([place]) Germaine Pomfret, [facility] as Pharmacist (Pharmacist)  Indicate any recent Medical Services you may have received from other than [facility] providers in the past year (date may be approximate).     Assessment:   This is a routine wellness examination for Logan Orr.  Hearing/Vision screen Vision Screening - Comments:: Declined referral eye exam 05/17/2022  Dietary issues and  exercise activities discussed: Current Exercise Habits: Home exercise routine, Type of exercise: walking, Time (Minutes): 60, Frequency (Times/Week): 5, Weekly Exercise (Minutes/Week): 300, Intensity: Mild, Exercise limited by: None identified   Goals Addressed             This Visit's Progress    Patient Stated   On track    Drink more water       Depression Screen    05/17/2022   11:08 AM 01/04/2022    4:05 PM 09/02/2021   10:52 AM 01/27/2021    8:25 AM 12/31/2020    8:34 AM 05/01/2020    4:56 PM 05/01/2020   12:22 PM  PHQ 2/9 Scores  PHQ - 2 Score 0 0 0 0 0 4 4  PHQ- 9 Score      9 7    Fall Risk    05/17/2022   11:06 AM 01/04/2022    4:05 PM 09/02/2021   10:52 AM 01/27/2021    8:25 AM 12/31/2020    8:34 AM  Fall Risk   Falls in the past year? 0 0 0 0 0  Number falls in past yr: 0 0 0    Injury with Fall? 0      Risk for fall due to : No Fall Risks      Follow up Falls prevention discussed        [place]:  Any stairs in or around the home? No  If so, are there any without handrails? No  Home free of loose throw rugs in walkways, pet beds, electrical cords, etc? Yes  Adequate lighting in your home to reduce risk of falls? Yes   ASSISTIVE DEVICES UTILIZED TO PREVENT FALLS:  Life alert? No  Use of a cane, walker or w/c? No  Grab bars in the bathroom? No  Shower chair or bench in shower? No  Elevated toilet seat or a handicapped toilet? No      05/17/2022   11:10 AM  6CIT Screen  What Year? 0 points  What month? 0 points  What time? 0 points  Count back from 20 0 points  Months in reverse 0 points  Repeat phrase 0 points  Total Score 0 points    Immunizations Immunization History  Administered Date(s) Administered   PFIZER Comirnaty(Gray Top)Covid-19 Tri-Sucrose Vaccine 09/28/2021    TDAP status: Due, Education has been provided regarding the importance of this vaccine. Advised may receive this vaccine at local  pharmacy or Health Dept. Aware to provide a copy of the vaccination record if obtained from local pharmacy or Health Dept. Verbalized acceptance and understanding.  Flu Vaccine status: Declined, Education has been provided regarding the importance of this vaccine but patient still declined. Advised may receive this vaccine at local pharmacy or Health Dept. Aware to provide a copy of the vaccination record if obtained from local pharmacy or Health Dept. Verbalized acceptance and understanding.  Pneumococcal vaccine status: Declined,  Education has been provided regarding the importance of this vaccine but patient still declined. Advised may receive this vaccine at local pharmacy or Health Dept. Aware to provide a copy of the vaccination record if obtained from local pharmacy or Health Dept. Verbalized acceptance and understanding.   Covid-19 vaccine status: Declined, Education has been provided regarding the importance of this vaccine but patient still declined. Advised may receive this vaccine at local pharmacy or Health Dept.or vaccine clinic. Aware to provide a copy of the vaccination record if obtained from local pharmacy or Health Dept. Verbalized acceptance and understanding.  Qualifies for Shingles Vaccine? Yes   Zostavax completed No   Shingrix Completed?: No.    Education has been provided regarding the importance of this vaccine. Patient has been advised to call insurance company to determine out of pocket expense if they have not yet received this vaccine. Advised may also receive vaccine at local pharmacy or Health Dept. Verbalized acceptance and understanding.  Screening Tests Health Maintenance  Topic Date Due   TETANUS/TDAP  Never done   Zoster Vaccines- Shingrix (1 of 2) Never done   COLONOSCOPY (Pts 45-1yr Insurance coverage will need to be confirmed)  03/22/2022   Hepatitis C Screening  Completed   HPV VACCINES  Aged Out   Pneumonia Vaccine 70 Years old  Discontinued    INFLUENZA VACCINE  Discontinued   COVID-19 Vaccine  Discontinued    Health Maintenance  Health Maintenance Due  Topic Date Due   TETANUS/TDAP  Never done   Zoster Vaccines- Shingrix (1 of 2) Never done   COLONOSCOPY (Pts 45-429yrInsurance coverage will need to be confirmed)  03/22/2022    Colorectal cancer screening: Referral to GI placed 05/17/2022. Pt aware the office will call re: appt.  Lung Cancer Screening: (Low Dose CT Chest recommended if Age 788-80ears, 30 pack-year currently smoking OR have quit w/in 15years.) does not qualify.   Lung Cancer Screening Referral: n/a  Additional Screening:  Hepatitis C Screening: does not qualify;  Vision Screening: Recommended annual ophthalmology exams for early detection of glaucoma and other disorders of the eye. Is the patient up to date with their annual eye exam?  No  Who is the provider or what is the name of the office in which the patient attends annual eye exams? None, declined referral at this time  If pt is not established with a provider, would they like to be referred to a provider to establish care? No .   Dental Screening: Recommended annual dental exams for proper oral hygiene  Community Resource Referral / Chronic Care Management: CRR required this visit?  No   CCM required this visit?  No  Plan:     I have personally reviewed and noted the following in the patient's chart:   Medical and social history Use of alcohol, tobacco or illicit drugs  Current medications and supplements including opioid prescriptions. Patient is not currently taking opioid prescriptions. Functional ability and status Nutritional status Physical activity Advanced directives List of other physicians Hospitalizations, surgeries, and ER visits in previous 12 months Vitals Screenings to include cognitive, depression, and falls Referrals and appointments  In addition, I have reviewed and discussed with patient certain  preventive protocols, quality metrics, and best practice recommendations. A written personalized care plan for preventive services as well as general preventive health recommendations were provided to patient.     Daphane Shepherd, LPN   [id number]   Nurse Notes: declined eye exam referral , declined updating any vaccines

## 2022-05-17 NOTE — Patient Instructions (Signed)
Logan Orr , Thank you for taking time to come for your Medicare Wellness Visit. I appreciate your ongoing commitment to your health goals. Please review the following plan we discussed and let me know if I can assist you in the future.   These are the goals we discussed:  Goals      Chronic Care Management     CARE PLAN ENTRY (see longitudinal plan of care for additional care plan information)  Current Barriers:  Chronic Disease Management support, education, and care coordination needs related to Hypertension, Hyperlipidemia, BPH, and Constipation   Hypertension BP Readings from Last 3 Encounters:  02/06/20 124/80  01/28/20 124/84  10/03/19 (!) 142/79  Pharmacist Clinical Goal(s): Over the next 90 days, patient will work with PharmD and providers to maintain BP goal <130/80 Current regimen:  Amlodipine 10 mg daily Patient self care activities - Over the next 90 days, patient will: Check blood pressure at least once weekly, document, and provide at future appointments Ensure daily salt intake < 2300 mg/day  Hyperlipidemia Lab Results  Component Value Date/Time   LDLCALC 199 (H) 08/03/2018 02:19 PM   LDLDIRECT 93.0 01/28/2020 04:11 PM  Pharmacist Clinical Goal(s): Over the next 90 days, patient will work with PharmD and providers to maintain LDL goal < 100 Current regimen:  Atorvastatin 20 mg daily   Medication management Pharmacist Clinical Goal(s): Over the next 90 days, patient will work with PharmD and providers to maintain optimal medication adherence Current pharmacy: [facility] Comprehensive medication review performed. Continue current medication management strategy Patient self care activities - Over the next 30 days, patient will: Begin taking a daily multivitamin with iron as recommended by Dr. Ethelene Hal Take medications as prescribed Report any questions or concerns to PharmD and/or provider(s)     Patient Stated     Drink more  water        This is a list of the screening recommended for you and due dates:  Health Maintenance  Topic Date Due   Tetanus Vaccine  Never done   Zoster (Shingles) Vaccine (1 of 2) Never done   Colon Cancer Screening  03/22/2022   Hepatitis C Screening: USPSTF Recommendation to screen - Ages 24-79 yo.  Completed   HPV Vaccine  Aged Out   Pneumonia Vaccine  Discontinued   Flu Shot  Discontinued   COVID-19 Vaccine  Discontinued    Advanced directives: Advance directive discussed with you today. I have provided a copy for you to complete at home and have notarized. Once this is complete please bring a copy in to our office so we can scan it into your chart.   Conditions/risks identified: Aim for 30 minutes of exercise or brisk walking, 6-8 glasses of water, and 5 servings of fruits and vegetables each day.   Next appointment: Follow up in one year for your annual wellness visit.   Preventive Care 38 Years and Older, Male  Preventive care refers to lifestyle choices and visits with your health care provider that can promote health and wellness. What does preventive care include? A yearly physical exam. This is also called an annual well check. Dental exams once or twice a year. Routine eye exams. Ask your health care provider how often you should have your eyes checked. Personal lifestyle choices, including: Daily care of your teeth and gums. Regular physical activity. Eating a healthy diet. Avoiding tobacco and drug use. Limiting alcohol use. Practicing safe sex. Taking low doses of aspirin every  day. Taking vitamin and mineral supplements as recommended by your health care provider. What happens during an annual well check? The services and screenings done by your health care provider during your annual well check will depend on your age, overall health, lifestyle risk factors, and family history of disease. Counseling  Your health care provider may ask you questions about  your: Alcohol use. Tobacco use. Drug use. Emotional well-being. Home and relationship well-being. Sexual activity. Eating habits. History of falls. Memory and ability to understand (cognition). Work and work Statistician. Screening  You may have the following tests or measurements: Height, weight, and BMI. Blood pressure. Lipid and cholesterol levels. These may be checked every 5 years, or more frequently if you are over 16 years old. Skin check. Lung cancer screening. You may have this screening every year starting at age 27 if you have a 30-pack-year history of smoking and currently smoke or have quit within the past 15 years. Fecal occult blood test (FOBT) of the stool. You may have this test every year starting at age 84. Flexible sigmoidoscopy or colonoscopy. You may have a sigmoidoscopy every 5 years or a colonoscopy every 10 years starting at age 36. Prostate cancer screening. Recommendations will vary depending on your family history and other risks. Hepatitis C blood test. Hepatitis B blood test. Sexually transmitted disease (STD) testing. Diabetes screening. This is done by checking your blood sugar (glucose) after you have not eaten for a while (fasting). You may have this done every 1-3 years. Abdominal aortic aneurysm (AAA) screening. You may need this if you are a current or former smoker. Osteoporosis. You may be screened starting at age 79 if you are at high risk. Talk with your health care provider about your test results, treatment options, and if necessary, the need for more tests. Vaccines  Your health care provider may recommend certain vaccines, such as: Influenza vaccine. This is recommended every year. Tetanus, diphtheria, and acellular pertussis (Tdap, Td) vaccine. You may need a Td booster every 10 years. Zoster vaccine. You may need this after age 29. Pneumococcal 13-valent conjugate (PCV13) vaccine. One dose is recommended after age 41. Pneumococcal  polysaccharide (PPSV23) vaccine. One dose is recommended after age 39. Talk to your health care provider about which screenings and vaccines you need and how often you need them. This information is not intended to replace advice given to you by your health care provider. Make sure you discuss any questions you have with your health care provider. Document Released: 09/04/2015 Document Revised: 04/27/2016 Document Reviewed: 06/09/2015 Elsevier Interactive Patient Education  2017 [place] Prevention in the Home Falls can cause injuries. They can happen to people of all ages. There are many things you can do to make your home safe and to help prevent falls. What can I do on the outside of my home? Regularly fix the edges of walkways and driveways and fix any cracks. Remove anything that might make you trip as you walk through a door, such as a raised step or threshold. Trim any bushes or trees on the path to your home. Use bright outdoor lighting. Clear any walking paths of anything that might make someone trip, such as rocks or tools. Regularly check to see if handrails are loose or broken. Make sure that both sides of any steps have handrails. Any raised decks and porches should have guardrails on the edges. Have any leaves, snow, or ice cleared regularly. Use sand or salt on walking paths  during winter. Clean up any spills in your garage right away. This includes oil or grease spills. What can I do in the bathroom? Use night lights. Install grab bars by the toilet and in the tub and shower. Do not use towel bars as grab bars. Use non-skid mats or decals in the tub or shower. If you need to sit down in the shower, use a plastic, non-slip stool. Keep the floor dry. Clean up any water that spills on the floor as soon as it happens. Remove soap buildup in the tub or shower regularly. Attach bath mats securely with double-sided non-slip rug tape. Do not have throw rugs and other  things on the floor that can make you trip. What can I do in the bedroom? Use night lights. Make sure that you have a light by your bed that is easy to reach. Do not use any sheets or blankets that are too big for your bed. They should not hang down onto the floor. Have a firm chair that has side arms. You can use this for support while you get dressed. Do not have throw rugs and other things on the floor that can make you trip. What can I do in the kitchen? Clean up any spills right away. Avoid walking on wet floors. Keep items that you use a lot in easy-to-reach places. If you need to reach something above you, use a strong step stool that has a grab bar. Keep electrical cords out of the way. Do not use floor polish or wax that makes floors slippery. If you must use wax, use non-skid floor wax. Do not have throw rugs and other things on the floor that can make you trip. What can I do with my stairs? Do not leave any items on the stairs. Make sure that there are handrails on both sides of the stairs and use them. Fix handrails that are broken or loose. Make sure that handrails are as long as the stairways. Check any carpeting to make sure that it is firmly attached to the stairs. Fix any carpet that is loose or worn. Avoid having throw rugs at the top or bottom of the stairs. If you do have throw rugs, attach them to the floor with carpet tape. Make sure that you have a light switch at the top of the stairs and the bottom of the stairs. If you do not have them, ask someone to add them for you. What else can I do to help prevent falls? Wear shoes that: Do not have high heels. Have rubber bottoms. Are comfortable and fit you well. Are closed at the toe. Do not wear sandals. If you use a stepladder: Make sure that it is fully opened. Do not climb a closed stepladder. Make sure that both sides of the stepladder are locked into place. Ask someone to hold it for you, if possible. Clearly  mark and make sure that you can see: Any grab bars or handrails. First and last steps. Where the edge of each step is. Use tools that help you move around (mobility aids) if they are needed. These include: Canes. Walkers. Scooters. Crutches. Turn on the lights when you go into a dark area. Replace any light bulbs as soon as they burn out. Set up your furniture so you have a clear path. Avoid moving your furniture around. If any of your floors are uneven, fix them. If there are any pets around you, be aware of where they are. Review  your medicines with your doctor. Some medicines can make you feel dizzy. This can increase your chance of falling. Ask your doctor what other things that you can do to help prevent falls. This information is not intended to replace advice given to you by your health care provider. Make sure you discuss any questions you have with your health care provider. Document Released: 06/04/2009 Document Revised: 01/14/2016 Document Reviewed: 09/12/2014 Elsevier Interactive Patient Education  2017 [employer].

## 2022-05-20 ENCOUNTER — Telehealth: Payer: Self-pay | Admitting: Family Medicine

## 2022-05-20 ENCOUNTER — Other Ambulatory Visit: Payer: Self-pay | Admitting: Endocrinology

## 2022-05-20 ENCOUNTER — Other Ambulatory Visit: Payer: Self-pay | Admitting: Family Medicine

## 2022-05-20 DIAGNOSIS — E890 Postprocedural hypothyroidism: Secondary | ICD-10-CM

## 2022-05-20 DIAGNOSIS — N401 Enlarged prostate with lower urinary tract symptoms: Secondary | ICD-10-CM

## 2022-05-20 NOTE — Telephone Encounter (Signed)
Patient calling states that he is in a lot of pain in lower glut area that radiates to both legs. Patient would like something sent in for pain. Informed patient that he would need an OV visit foe evaluation, schedules are all booked for today patient scheduled for Monday would like to know if there's something he could take or do until his appointment? Please advise.

## 2022-05-20 NOTE — Telephone Encounter (Signed)
Pt want to know if you can prescibe medication for his lower butt pain

## 2022-05-23 ENCOUNTER — Ambulatory Visit (INDEPENDENT_AMBULATORY_CARE_PROVIDER_SITE_OTHER): Payer: Medicare HMO

## 2022-05-23 ENCOUNTER — Ambulatory Visit (INDEPENDENT_AMBULATORY_CARE_PROVIDER_SITE_OTHER): Payer: Medicare HMO | Admitting: Family Medicine

## 2022-05-23 ENCOUNTER — Encounter: Payer: Self-pay | Admitting: Family Medicine

## 2022-05-23 VITALS — BP 106/80 | HR 80 | Temp 98.4°F | Ht 69.0 in | Wt 221.6 lb

## 2022-05-23 DIAGNOSIS — I10 Essential (primary) hypertension: Secondary | ICD-10-CM

## 2022-05-23 DIAGNOSIS — E7800 Pure hypercholesterolemia, unspecified: Secondary | ICD-10-CM | POA: Diagnosis not present

## 2022-05-23 DIAGNOSIS — R102 Pelvic and perineal pain: Secondary | ICD-10-CM

## 2022-05-23 DIAGNOSIS — Z8546 Personal history of malignant neoplasm of prostate: Secondary | ICD-10-CM

## 2022-05-23 DIAGNOSIS — E890 Postprocedural hypothyroidism: Secondary | ICD-10-CM | POA: Diagnosis not present

## 2022-05-23 DIAGNOSIS — E538 Deficiency of other specified B group vitamins: Secondary | ICD-10-CM

## 2022-05-23 DIAGNOSIS — M160 Bilateral primary osteoarthritis of hip: Secondary | ICD-10-CM | POA: Diagnosis not present

## 2022-05-23 LAB — URINALYSIS, ROUTINE W REFLEX MICROSCOPIC
Bilirubin Urine: NEGATIVE
Hgb urine dipstick: NEGATIVE
Ketones, ur: NEGATIVE
Leukocytes,Ua: NEGATIVE
Nitrite: NEGATIVE
RBC / HPF: NONE SEEN (ref 0–?)
Specific Gravity, Urine: 1.025 (ref 1.000–1.030)
Total Protein, Urine: NEGATIVE
Urine Glucose: NEGATIVE
Urobilinogen, UA: 0.2 (ref 0.0–1.0)
pH: 5.5 (ref 5.0–8.0)

## 2022-05-23 MED ORDER — TRAMADOL HCL 50 MG PO TABS: ORAL_TABLET | ORAL | 0 refills | Status: DC

## 2022-05-23 NOTE — Progress Notes (Unsigned)
Initial visit for bilateral hip pain radiating down both legs Denies injury Hx of prostate cancer

## 2022-05-23 NOTE — Progress Notes (Addendum)
Established Patient Office Visit  Subjective   Patient ID: Logan Orr, male    DOB: 05/11/1951  Age: 71 y.o. MRN: [id number]  Chief Complaint  Patient presents with   Acute Visit    Pain in lower left side buttocks area and both legs. X 1 week. Not fasting.    HPI complains of a 1 week history of posterior pelvic pain that radiates into both of his legs.  Denies weakness in his lower extremities.  Denies saddle paresthesias.  No change in his bowel or bladder function.  No injury.  Pain radiates into the thigh areas anteriorly and posteriorly.  History of prostate cancer treated with radioactive seeding back in 2020.    Review of Systems  Constitutional: Negative.   HENT: Negative.    Eyes:  Negative for blurred vision, discharge and redness.  Respiratory: Negative.    Cardiovascular: Negative.   Gastrointestinal:  Negative for abdominal pain.  Genitourinary: Negative.   Musculoskeletal: Negative.  Negative for myalgias.  Skin:  Negative for rash.  Neurological:  Negative for tingling, loss of consciousness and weakness.  Endo/Heme/Allergies:  Negative for polydipsia.      Objective:     BP 106/80 (BP Location: Right Arm, Patient Position: Sitting, Cuff Size: Normal)   Pulse 80   Temp 98.4 F (36.9 C) (Temporal)   Ht '5\' 9"'$  (1.753 m)   Wt 221 lb 9.6 oz (100.5 kg)   SpO2 96%   BMI 32.72 kg/m    Physical Exam Constitutional:      General: He is not in acute distress.    Appearance: Normal appearance. He is not ill-appearing, toxic-appearing or diaphoretic.  HENT:     Head: Normocephalic and atraumatic.     Right Ear: External ear normal.     Left Ear: External ear normal.  Eyes:     General: No scleral icterus.       Right eye: No discharge.        Left eye: No discharge.     Extraocular Movements: Extraocular movements intact.     Conjunctiva/sclera: Conjunctivae normal.  Cardiovascular:     Rate and Rhythm: Normal rate and regular rhythm.  Pulmonary:      Effort: Pulmonary effort is normal. No respiratory distress.     Breath sounds: Normal breath sounds.  Abdominal:     General: Bowel sounds are normal.     Tenderness: There is no abdominal tenderness. There is no guarding.  Musculoskeletal:     Cervical back: No rigidity or tenderness.     Lumbar back: No tenderness or bony tenderness. Normal range of motion. Negative right straight leg raise test and negative left straight leg raise test.  Skin:    General: Skin is warm and dry.  Neurological:     Mental Status: He is alert and oriented to person, place, and time.     Motor: No weakness.     Deep Tendon Reflexes:     Reflex Scores:      Patellar reflexes are 1+ on the right side and 1+ on the left side.      Achilles reflexes are 1+ on the right side and 1+ on the left side. Psychiatric:        Mood and Affect: Mood normal.        Behavior: Behavior normal.      Results for orders placed or performed in visit on 05/23/22  PSA  Result Value Ref Range   PSA 0.17 0.10 -  4.00 ng/mL  Vitamin B12  Result Value Ref Range   Vitamin B-12 591 211 - 911 pg/mL  Basic metabolic panel  Result Value Ref Range   Sodium 140 135 - 145 mEq/L   Potassium 3.9 3.5 - 5.1 mEq/L   Chloride 106 96 - 112 mEq/L   CO2 25 19 - 32 mEq/L   Glucose, Bld 96 70 - 99 mg/dL   BUN 13 6 - 23 mg/dL   Creatinine, Ser 1.40 0.40 - 1.50 mg/dL   GFR 50.93 (L) >60.00 mL/min   Calcium 9.6 8.4 - 10.5 mg/dL  CBC  Result Value Ref Range   WBC 3.8 (L) 4.0 - 10.5 K/uL   RBC 4.36 4.22 - 5.81 Mil/uL   Platelets 296.0 150.0 - 400.0 K/uL   Hemoglobin 12.9 (L) 13.0 - 17.0 g/dL   HCT 38.8 (L) 39.0 - 52.0 %   MCV 89.1 78.0 - 100.0 fl   MCHC 33.2 30.0 - 36.0 g/dL   RDW 13.2 11.5 - 15.5 %  LDL cholesterol, direct  Result Value Ref Range   Direct LDL 103.0 mg/dL  Urinalysis, Routine w reflex microscopic  Result Value Ref Range   Color, Urine YELLOW Yellow;Lt. Yellow;Straw;Dark Yellow;Amber;Green;Red;Brown    APPearance CLEAR Clear;Turbid;Slightly Cloudy;Cloudy   Specific Gravity, Urine 1.025 1.000 - 1.030   pH 5.5 5.0 - 8.0   Total Protein, Urine NEGATIVE Negative   Urine Glucose NEGATIVE Negative   Ketones, ur NEGATIVE Negative   Bilirubin Urine NEGATIVE Negative   Hgb urine dipstick NEGATIVE Negative   Urobilinogen, UA 0.2 0.0 - 1.0   Leukocytes,Ua NEGATIVE Negative   Nitrite NEGATIVE Negative   WBC, UA 0-2/hpf 0-2/hpf   RBC / HPF none seen 0-2/hpf   Mucus, UA Presence of (A) None   Squamous Epithelial / LPF Rare(0-4/hpf) Rare(0-4/hpf)  TSH  Result Value Ref Range   TSH 5.04 0.35 - 5.50 uIU/mL      The 10-year ASCVD risk score (Arnett DK, et al., 2019) is: 13.3%    Assessment & Plan:   Problem List Items Addressed This Visit       Cardiovascular and Mediastinum   Essential hypertension   Relevant Medications   atorvastatin (LIPITOR) 40 MG tablet   Other Relevant Orders   Basic metabolic panel (Completed)   Urinalysis, Routine w reflex microscopic (Completed)     Endocrine   Hypothyroidism, postsurgical   Relevant Orders   TSH (Completed)     Other   Elevated LDL cholesterol level   Relevant Medications   atorvastatin (LIPITOR) 40 MG tablet   Other Relevant Orders   LDL cholesterol, direct (Completed)   B12 deficiency   Relevant Orders   Vitamin B12 (Completed)   CBC (Completed)   Other Visit Diagnoses     Pain in pelvis    -  Primary   Relevant Medications   traMADol (ULTRAM) 50 MG tablet   Other Relevant Orders   DG Hip Unilat W OR W/O Pelvis 2-3 Views Right (Completed)   DG Hip Unilat W OR W/O Pelvis 2-3 Views Left (Completed)   NM Bone Scan Whole Body   History of prostate cancer       Relevant Orders   PSA (Completed)       Return in about 2 weeks (around 06/06/2022).  Rechecking PSA.  Films of the pelvis.  Back exam is normal.  Libby Maw, MD

## 2022-05-24 LAB — BASIC METABOLIC PANEL
BUN: 13 mg/dL (ref 6–23)
CO2: 25 mEq/L (ref 19–32)
Calcium: 9.6 mg/dL (ref 8.4–10.5)
Chloride: 106 mEq/L (ref 96–112)
Creatinine, Ser: 1.4 mg/dL (ref 0.40–1.50)
GFR: 50.93 mL/min — ABNORMAL LOW (ref >60.00)
Glucose, Bld: 96 mg/dL (ref 70–99)
Potassium: 3.9 mEq/L (ref 3.5–5.1)
Sodium: 140 mEq/L (ref 135–145)

## 2022-05-24 LAB — PSA: PSA: 0.17 ng/mL (ref 0.10–4.00)

## 2022-05-24 LAB — VITAMIN B12: Vitamin B-12: 591 pg/mL (ref 211–911)

## 2022-05-24 LAB — CBC
HCT: 38.8 % — ABNORMAL LOW (ref 39.0–52.0)
Hemoglobin: 12.9 g/dL — ABNORMAL LOW (ref 13.0–17.0)
MCHC: 33.2 g/dL (ref 30.0–36.0)
MCV: 89.1 fl (ref 78.0–100.0)
Platelets: 296 10*3/uL (ref 150.0–400.0)
RBC: 4.36 Mil/uL (ref 4.22–5.81)
RDW: 13.2 % (ref 11.5–15.5)
WBC: 3.8 10*3/uL — ABNORMAL LOW (ref 4.0–10.5)

## 2022-05-24 LAB — LDL CHOLESTEROL, DIRECT: Direct LDL: 103 mg/dL

## 2022-05-24 LAB — TSH: TSH: 5.04 u[IU]/mL (ref 0.35–5.50)

## 2022-05-24 MED ORDER — ATORVASTATIN CALCIUM 40 MG PO TABS: 40.0000 mg | ORAL_TABLET | Freq: Every day | ORAL | 3 refills | Status: DC

## 2022-05-24 NOTE — Addendum Note (Signed)
Addended by: Jon Billings on: 05/24/2022 01:47 PM   Modules accepted: Orders

## 2022-05-30 NOTE — Addendum Note (Signed)
Addended by: Jon Billings on: 05/30/2022 12:38 PM   Modules accepted: Orders

## 2022-05-31 NOTE — Progress Notes (Signed)
Nuclear med bone scan

## 2022-06-05 IMAGING — DX DG CHEST 2V
2 series · 2 of 2 positions shown · non-contrast
Comparison: 09/15/2020

CLINICAL DATA: 70-year-old male with a history of pending total
thyroidectomy

EXAM:
CHEST - 2 VIEW

[chest pa]
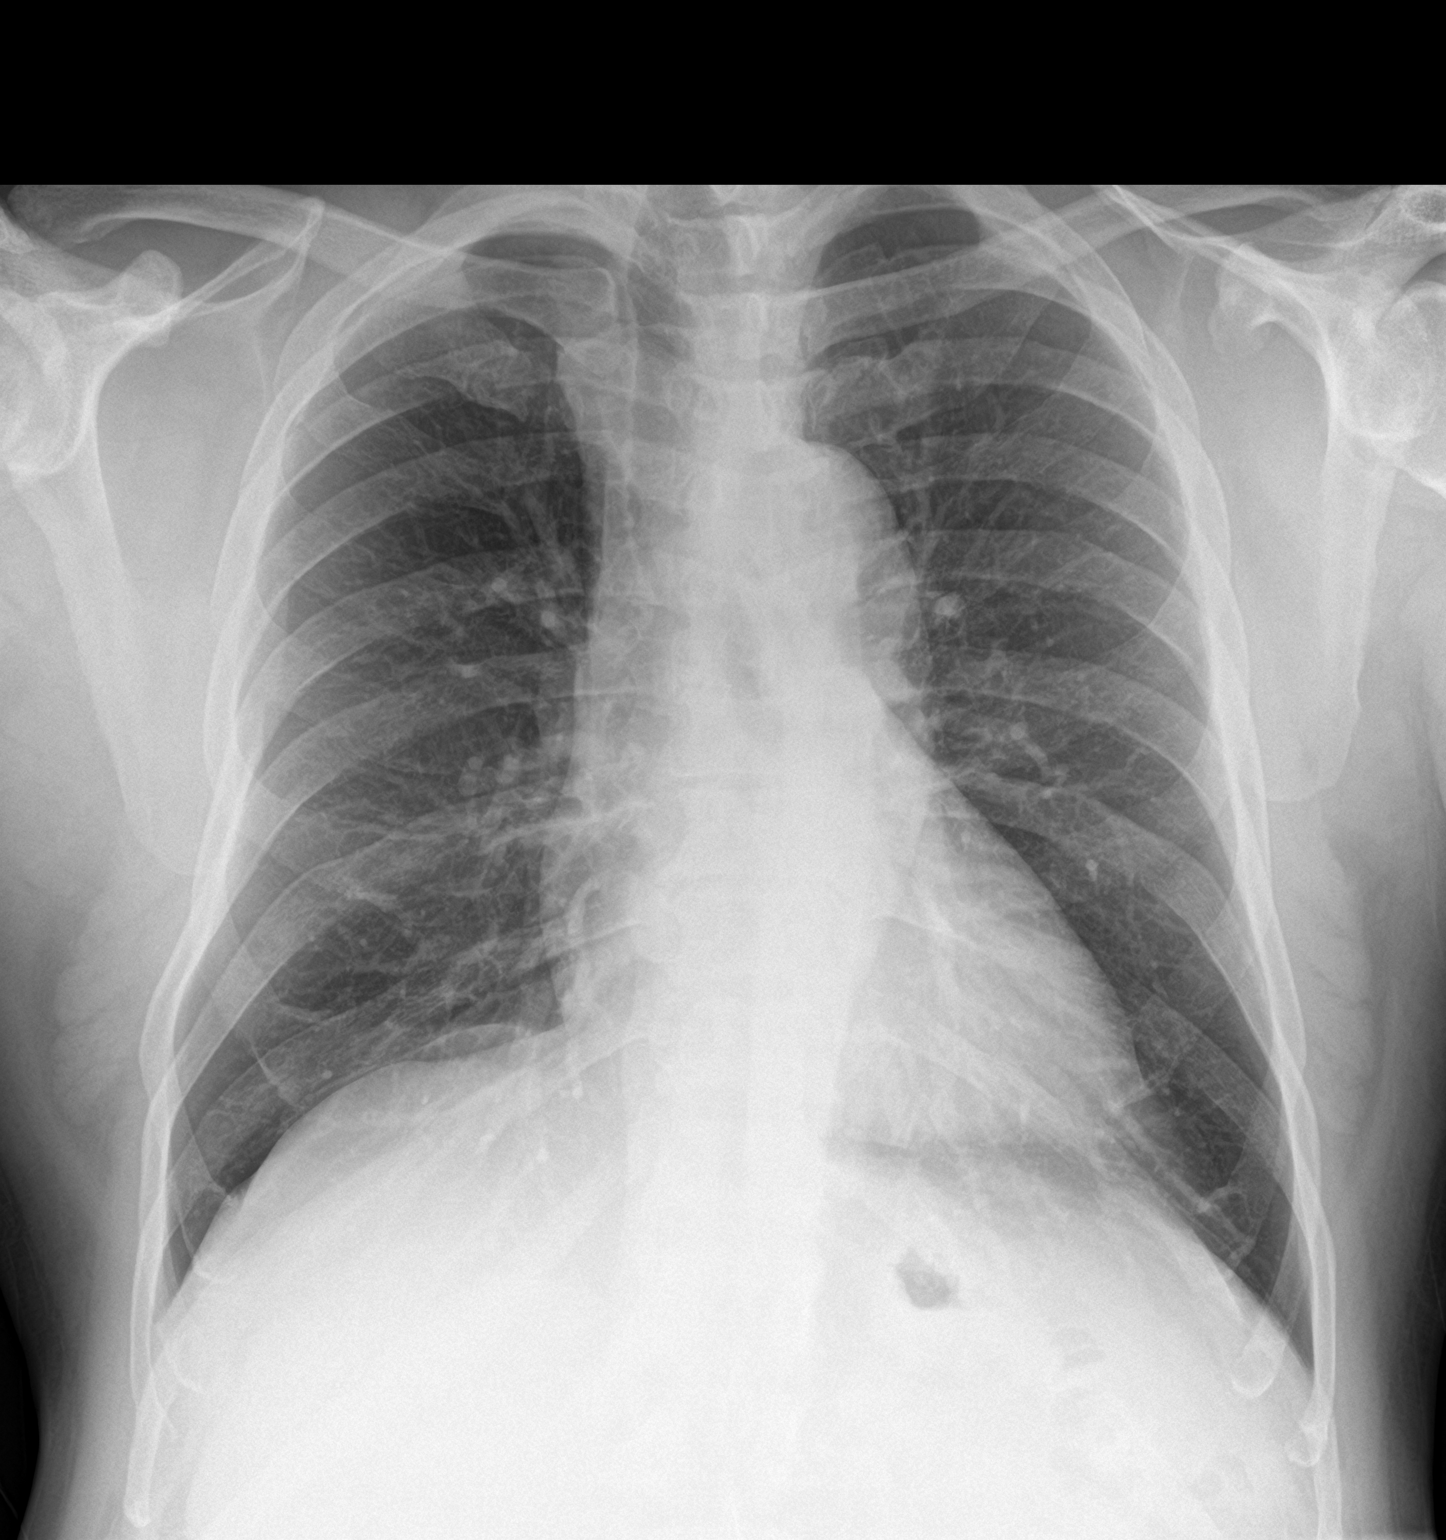

[chest lat]
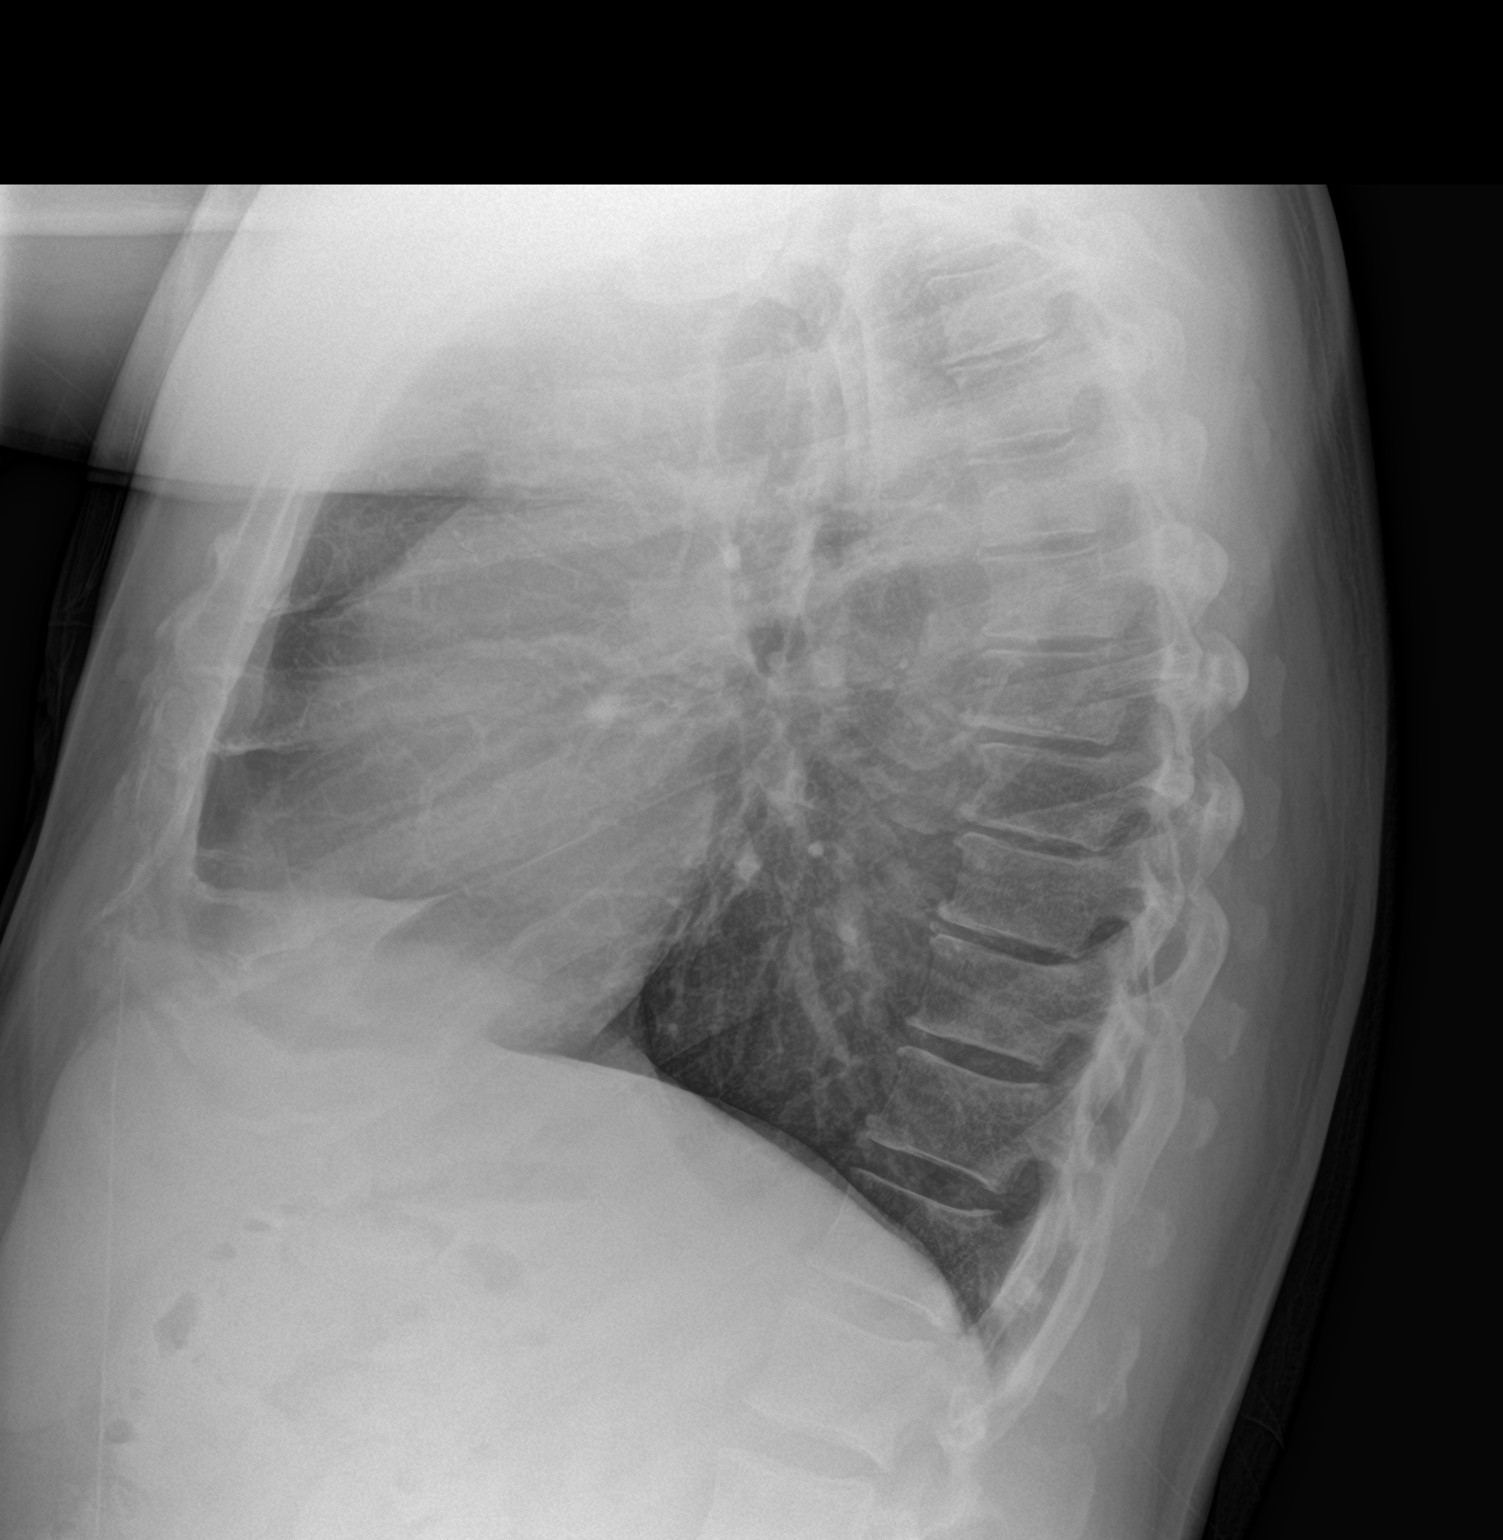

[2 of 2 positions shown; findings below may reference images not displayed]

FINDINGS: Cardiomediastinal silhouette unchanged in size and contour. No
evidence of central vascular congestion. No interlobular septal
thickening. No pneumothorax or pleural effusion. Coarsened
interstitial markings, with no confluent airspace disease.

No acute displaced fracture
IMPRESSION: Negative for acute cardiopulmonary disease

## 2022-06-06 ENCOUNTER — Encounter: Payer: Self-pay | Admitting: Family Medicine

## 2022-06-06 ENCOUNTER — Ambulatory Visit (INDEPENDENT_AMBULATORY_CARE_PROVIDER_SITE_OTHER): Payer: Medicare HMO | Admitting: Family Medicine

## 2022-06-06 VITALS — BP 118/68 | HR 96 | Temp 97.3°F | Ht 69.0 in | Wt 221.4 lb

## 2022-06-06 DIAGNOSIS — R937 Abnormal findings on diagnostic imaging of other parts of musculoskeletal system: Secondary | ICD-10-CM

## 2022-06-06 DIAGNOSIS — E890 Postprocedural hypothyroidism: Secondary | ICD-10-CM

## 2022-06-06 DIAGNOSIS — Z8546 Personal history of malignant neoplasm of prostate: Secondary | ICD-10-CM | POA: Diagnosis not present

## 2022-06-06 NOTE — Progress Notes (Signed)
   Established Patient Office Visit  Subjective   Patient ID: Logan Orr, male    DOB: [birth year]  Age: 71 y.o. MRN: [id number]  Chief Complaint  Patient presents with   Follow-up    2 week follow up on pelvic pain, no concerns.     HPI pelvic pain has cleared.  He has not needed to use any of the tramadol.  Has not heard from nuclear medicine.  Has been compliant with his levothyroxine.    Review of Systems  Constitutional: Negative.   HENT: Negative.    Eyes:  Negative for blurred vision, discharge and redness.  Respiratory: Negative.    Cardiovascular: Negative.   Gastrointestinal:  Negative for abdominal pain.  Genitourinary: Negative.   Musculoskeletal: Negative.  Negative for myalgias.  Skin:  Negative for rash.  Neurological:  Negative for tingling, loss of consciousness and weakness.  Endo/Heme/Allergies:  Negative for polydipsia.      Objective:     BP 118/68 (BP Location: Right Arm, Patient Position: Sitting, Cuff Size: Normal)   Pulse 96   Temp (!) 97.3 F (36.3 C) (Temporal)   Ht '5\' 9"'$  (1.753 m)   Wt 221 lb 6.4 oz (100.4 kg)   SpO2 96%   BMI 32.70 kg/m    Physical Exam Constitutional:      General: He is not in acute distress.    Appearance: Normal appearance. He is not ill-appearing, toxic-appearing or diaphoretic.  HENT:     Head: Normocephalic and atraumatic.     Right Ear: External ear normal.     Left Ear: External ear normal.  Eyes:     General: No scleral icterus.       Right eye: No discharge.        Left eye: No discharge.     Extraocular Movements: Extraocular movements intact.     Conjunctiva/sclera: Conjunctivae normal.  Pulmonary:     Effort: Pulmonary effort is normal. No respiratory distress.  Skin:    General: Skin is warm and dry.  Neurological:     Mental Status: He is alert and oriented to person, place, and time.  Psychiatric:        Mood and Affect: Mood normal.        Behavior: Behavior normal.      No results  found for any visits on 06/06/22.    The 10-year ASCVD risk score (Arnett DK, et al., 2019) is: 16.1%    Assessment & Plan:   Problem List Items Addressed This Visit       Endocrine   Hypothyroidism, postsurgical     Other   Abnormal x-ray of pelvis   Relevant Orders   NM Bone Scan Whole Body   History of prostate cancer - Primary    Return in about 3 months (around 09/06/2022).    Libby Maw, MD

## 2022-06-15 ENCOUNTER — Ambulatory Visit (HOSPITAL_COMMUNITY): Payer: Medicare HMO

## 2022-06-15 ENCOUNTER — Encounter (HOSPITAL_COMMUNITY): Payer: Medicare HMO

## 2022-07-04 ENCOUNTER — Encounter (HOSPITAL_COMMUNITY)
Admission: RE | Admit: 2022-07-04 | Discharge: 2022-07-04 | Disposition: A | Discharge status: Home / Self Care | Payer: Medicare HMO | Source: Ambulatory Visit | Attending: Family Medicine | Admitting: Family Medicine

## 2022-07-04 DIAGNOSIS — Z8546 Personal history of malignant neoplasm of prostate: Secondary | ICD-10-CM | POA: Diagnosis not present

## 2022-07-04 DIAGNOSIS — R937 Abnormal findings on diagnostic imaging of other parts of musculoskeletal system: Secondary | ICD-10-CM | POA: Insufficient documentation

## 2022-07-04 MED ORDER — TECHNETIUM TC 99M MEDRONATE IV KIT
18.9000 | PACK | Freq: Once | INTRAVENOUS | Status: AC | PRN
  Administered 2022-07-04: 18.9 via INTRAVENOUS

## 2022-07-06 ENCOUNTER — Telehealth: Payer: Self-pay

## 2022-07-06 NOTE — Telephone Encounter (Signed)
-----   Message from Libby Maw, MD sent at 07/06/2022 10:56 AM EST ----- No evidence for cancer!

## 2022-07-18 ENCOUNTER — Other Ambulatory Visit: Payer: Self-pay | Admitting: Family Medicine

## 2022-07-18 DIAGNOSIS — I10 Essential (primary) hypertension: Secondary | ICD-10-CM

## 2022-08-07 ENCOUNTER — Other Ambulatory Visit: Payer: Self-pay | Admitting: Endocrinology

## 2022-08-07 DIAGNOSIS — C73 Malignant neoplasm of thyroid gland: Secondary | ICD-10-CM

## 2022-08-07 DIAGNOSIS — E890 Postprocedural hypothyroidism: Secondary | ICD-10-CM

## 2022-08-11 ENCOUNTER — Other Ambulatory Visit: Payer: Medicare HMO

## 2022-08-17 ENCOUNTER — Other Ambulatory Visit: Payer: Medicare HMO

## 2022-08-17 ENCOUNTER — Ambulatory Visit: Payer: Medicare HMO | Admitting: Endocrinology

## 2022-09-06 ENCOUNTER — Ambulatory Visit (INDEPENDENT_AMBULATORY_CARE_PROVIDER_SITE_OTHER): Payer: Medicare PPO | Admitting: Endocrinology

## 2022-09-06 ENCOUNTER — Ambulatory Visit: Payer: Medicare HMO | Admitting: Family Medicine

## 2022-09-06 ENCOUNTER — Encounter: Payer: Self-pay | Admitting: Endocrinology

## 2022-09-06 VITALS — BP 128/84 | HR 81 | Ht 69.0 in | Wt 230.0 lb

## 2022-09-06 DIAGNOSIS — E890 Postprocedural hypothyroidism: Secondary | ICD-10-CM | POA: Diagnosis not present

## 2022-09-06 DIAGNOSIS — C73 Malignant neoplasm of thyroid gland: Secondary | ICD-10-CM

## 2022-09-06 LAB — T4, FREE: Free T4: 0.24 ng/dL — ABNORMAL LOW (ref 0.60–1.60)

## 2022-09-06 LAB — TSH: TSH: 48.57 u[IU]/mL — ABNORMAL HIGH (ref 0.35–5.50)

## 2022-09-06 NOTE — Progress Notes (Signed)
Patient ID: Logan Orr, male   DOB: 1951-01-25, 72 y.o.   MRN: [id number]          Reason for Appointment: Follow-up of thyroid    History of Present Illness:   He has hypothyroidism since his thyroidectomy in 12/2020 for papillary carcinoma  He has been taking levothyroxine 125 mcg daily but has not had any follow-up thyroid labs since his last visit in 8/22  He has been taking his levothyroxine before breakfast consistently in the morning with water and has not missed any doses However he is also taking a multivitamin at the same time of unknown composition  For the past 2 months he thinks he is getting more tired but not cold  Although his tumor was only 1.8 cm in the left isthmus he has had a total thyroidectomy, no thyroid nodules were seen on the pathology on the right side  Pathology report: Tumor Site: Mid left lobe, near the isthmus  Tumor Size: Greatest dimension: 1.8 cm  Histologic Type: Papillary carcinoma  Angioinvasion: Not identified  Lymphatic Invasion: Not identified  Extrathyroidal Extension: Not identified  Margin Status: All margins negative for invasive carcinoma  Regional Lymph Node Status: Not applicable  BACKGROUND history: The patient's thyroid enlargement was first discovered in 04/2020 on a routine exam by his PCP Thyroid ultrasound showed a 2 cm left thyroid nodule with TI-RADS 6 score based on irregular margins  His thyroid biopsy was finally done on 09/09/2020 and showed Bethesda 5 cytology suspicious for malignancy although scant cellularity reported   Lab Results  Component Value Date   FREET4 1.22 04/14/2021   FREET4 1.21 04/14/2021   TSH 5.04 05/23/2022   TSH 3.140 04/14/2021   TSH 3.030 04/14/2021   Thyroglobulin level as follows  Lab Results  Component Value Date   THYROGLB 2.5 04/14/2021    Previous ultrasound exam in 04/2020 showed  Nodule #1 location: Isthmus; Mid   Maximum size: 2.0 cm; Other 2 dimensions: 1.6 x 1.5 cm       Allergies as of 09/06/2022   No Known Allergies      Medication List        Accurate as of September 06, 2022  2:18 PM. If you have any questions, ask your nurse or doctor.          amLODipine 10 MG tablet Commonly known as: NORVASC TAKE 1 TABLET EVERY DAY   atorvastatin 40 MG tablet Commonly known as: LIPITOR Take 1 tablet (40 mg total) by mouth daily.   cyanocobalamin 1000 MCG tablet Commonly known as: VITAMIN B12 TAKE 1 TABLET EVERY DAY   levothyroxine 125 MCG tablet Commonly known as: Synthroid Take 1 tablet (125 mcg total) by mouth daily before breakfast.   lisinopril 5 MG tablet Commonly known as: ZESTRIL TAKE 1 TABLET EVERY DAY   multivitamin with minerals tablet Take 1 tablet by mouth daily.   PARoxetine 20 MG tablet Commonly known as: PAXIL Take 1 tablet (20 mg total) by mouth daily.   tamsulosin 0.4 MG Caps capsule Commonly known as: FLOMAX TAKE 2 CAPSULES EVERY DAY AFTER SUPPER   traMADol 50 MG tablet Commonly known as: ULTRAM May take 1:59 at night as needed for pain        Allergies: No Known Allergies  Past Medical History:  Diagnosis Date   Anginal pain ([place]) 2013   hospital for 5 days in [place]   Dyspnea 2013   Due to lung infection   Hemorrhoids  History of colon polyps    HLD (hyperlipidemia)    Hyperplasia of prostate with lower urinary tract symptoms (LUTS)    Hypertension    Pneumonia 2013   left side infection while in [place]   Prostate cancer [facility]) urologist-- dr bell/  oncologist-- dr Tammi Klippel   dx 11-23-2018  Stage T1c,  3+4   Thyroid nodule    08-03-2018  pt pcp note in epic, left side, ultasound ordered    Wears glasses     Past Surgical History:  Procedure Laterality Date   COLONOSCOPY     CYSTOSCOPY N/A 02/04/2019   Procedure: CYSTOSCOPY;  Surgeon: Lucas Mallow, MD;  Location: [facility];  Service: Urology;  Laterality: N/A;  no seeds seen per Dr Gloriann Loan   EYE SURGERY Bilateral 2020    LACERATION REPAIR  1990s   repair left lower arm complex laceration    PROSTATE BIOPSY  11-23-2018   at dr Gloriann Loan office   [place] N/A 02/04/2019   Procedure: RADIOACTIVE SEED IMPLANT/BRACHYTHERAPY IMPLANT;  Surgeon: Lucas Mallow, MD;  Location[facility];  Service: Urology;  Laterality: N/A;  90 seeds   SPACE OAR INSTILLATION N/A 02/04/2019   Procedure: SPACE OAR INSTILLATION;  Surgeon: Lucas Mallow, MD;  Location: [facility];  Service: Urology;  Laterality: N/A;   THYROIDECTOMY N/A 01/07/2021   Procedure: TOTAL THYROIDECTOMY;  Surgeon: Armandina Gemma, MD;  Location: [facility];  Service: General;  Laterality: N/A;    Family History  Adopted: Yes  Problem Relation Age of Onset   Thyroid disease Neg Hx     Social History:  reports that he has never smoked. He has never used smokeless tobacco. He reports current alcohol use of about 21.0 standard drinks of alcohol per week. He reports that he does not use drugs.   Review of Systems  Hypertension: Taking Norvasc from his PCP  BP Readings from Last 3 Encounters:  09/06/22 128/84  06/06/22 118/68  05/23/22 106/80     Examination:   BP 128/84 (BP Location: Right Arm, Patient Position: Sitting, Cuff Size: Normal)   Pulse 81   Ht '5\' 9"'$  (1.753 m)   Wt 230 lb (104.3 kg)   SpO2 98%   BMI 33.97 kg/m   No facial puffiness  Scar of thyroidectomy present  Thyroid exam: No enlargement of the thyroid or adjacent areas No lymphadenopathy in the neck region anteriorly Deep tendon reflexes are difficult to elicit    Assessment/Plan:  Previously diagnosed papillary thyroid cancer with 1.8 cm tumor and no invasive features or local spread Surgery was done in 5/22  Given his low-grade tumor he was not recommended any I-131 remnant ablation  His thyroglobulin is last 2.5 indicating only a small amount of residual thyroid tissue post thyroidectomy and will need  follow-up  Hypothyroidism: Needs follow-up TSH Not clear if he is symptomatic from hypothyroidism currently  Needs more regular follow-up   [name] 09/06/2022  Addendum: TSH 48, will increase his levothyroxine to 175 mcg-follow-up in 2 months for visit and labs

## 2022-09-07 LAB — TGAB+THYROGLOBULIN IMA OR LCMS: Thyroglobulin Antibody: <1 IU/mL (ref 0.0–0.9)

## 2022-09-07 LAB — THYROGLOBULIN BY IMA: Thyroglobulin by IMA: 8.1 ng/mL (ref 1.4–29.2)

## 2022-09-08 NOTE — Progress Notes (Signed)
His thyroid cancer test is increased.  However his thyroid level was extremely low also.  He needs to be seen back in follow-up in 2 months with labs same day.  Please also call him regarding the new levothyroxine dose per the previous result note

## 2022-09-09 MED ORDER — LEVOTHYROXINE SODIUM 175 MCG PO TABS: 175.0000 ug | ORAL_TABLET | Freq: Every day | ORAL | 3 refills | Status: DC

## 2022-09-09 NOTE — Addendum Note (Signed)
Addended by: Cinda Quest on: 09/09/2022 10:52 AM   Modules accepted: Orders

## 2022-09-15 ENCOUNTER — Ambulatory Visit: Payer: Medicare PPO | Admitting: Family Medicine

## 2022-09-20 ENCOUNTER — Ambulatory Visit (INDEPENDENT_AMBULATORY_CARE_PROVIDER_SITE_OTHER): Payer: Medicare PPO | Admitting: Family Medicine

## 2022-09-20 ENCOUNTER — Encounter: Payer: Self-pay | Admitting: Family Medicine

## 2022-09-20 VITALS — BP 122/80 | HR 87 | Temp 97.7°F | Ht 69.0 in | Wt 227.0 lb

## 2022-09-20 DIAGNOSIS — C61 Malignant neoplasm of prostate: Secondary | ICD-10-CM | POA: Diagnosis not present

## 2022-09-20 DIAGNOSIS — R351 Nocturia: Secondary | ICD-10-CM | POA: Diagnosis not present

## 2022-09-20 DIAGNOSIS — F5105 Insomnia due to other mental disorder: Secondary | ICD-10-CM | POA: Diagnosis not present

## 2022-09-20 DIAGNOSIS — F99 Mental disorder, not otherwise specified: Secondary | ICD-10-CM | POA: Diagnosis not present

## 2022-09-20 DIAGNOSIS — R350 Frequency of micturition: Secondary | ICD-10-CM | POA: Diagnosis not present

## 2022-09-20 DIAGNOSIS — K5901 Slow transit constipation: Secondary | ICD-10-CM | POA: Diagnosis not present

## 2022-09-20 DIAGNOSIS — F418 Other specified anxiety disorders: Secondary | ICD-10-CM | POA: Diagnosis not present

## 2022-09-20 MED ORDER — MIRTAZAPINE 15 MG PO TBDP: 15.0000 mg | ORAL_TABLET | Freq: Every day | ORAL | 1 refills | Status: DC

## 2022-09-20 MED ORDER — POLYETHYLENE GLYCOL 3350 17 GM/SCOOP PO POWD: ORAL | 1 refills | Status: AC

## 2022-09-20 NOTE — Progress Notes (Signed)
Established Patient Office Visit   Subjective:  Patient ID: Logan Orr, male    DOB: [birth year]  Age: 72 y.o. MRN: [id number]  Chief Complaint  Patient presents with   Follow-up    3 month follow up concerns about poor appetite feeling bloated all the time.     HPI Encounter Diagnoses  Name Primary?   Depression with anxiety Yes   Slow transit constipation    Insomnia due to other mental disorder    Patient is depressed.  He recently lost his job secondary to frequent bathroom breaks.  He does have a history of BPH currently treated with tamsulosin 0.8 mg daily.  He is not sleeping well.  He has lost weight.  His significant other is complaining about his attitude.  He denies any intention of self-harm but has thoughts that sometimes things would be better if he were not here.  He is no longer taking the Paxil.  He reports constipation and abdominal bloating.  Denies GERD symptoms, medic easier or melena.   Review of Systems  Constitutional: Negative.   HENT: Negative.    Eyes:  Negative for blurred vision, discharge and redness.  Respiratory: Negative.    Cardiovascular: Negative.   Gastrointestinal:  Negative for abdominal pain.  Genitourinary: Negative.   Musculoskeletal: Negative.  Negative for myalgias.  Skin:  Negative for rash.  Neurological:  Negative for tingling, loss of consciousness and weakness.  Endo/Heme/Allergies:  Negative for polydipsia.  Psychiatric/Behavioral:  Positive for depression. The patient is nervous/anxious and has insomnia.      Current Outpatient Medications:    amLODipine (NORVASC) 10 MG tablet, TAKE 1 TABLET EVERY DAY, Disp: 90 tablet, Rfl: 0   atorvastatin (LIPITOR) 40 MG tablet, Take 1 tablet (40 mg total) by mouth daily., Disp: 90 tablet, Rfl: 3   cyanocobalamin (VITAMIN B12) 1000 MCG tablet, TAKE 1 TABLET EVERY DAY, Disp: 90 tablet, Rfl: 2   levothyroxine (SYNTHROID) 175 MCG tablet, Take 1 tablet (175 mcg total) by mouth daily.,  Disp: 90 tablet, Rfl: 3   lisinopril (ZESTRIL) 5 MG tablet, TAKE 1 TABLET EVERY DAY, Disp: 90 tablet, Rfl: 3   mirtazapine (REMERON SOL-TAB) 15 MG disintegrating tablet, Take 1 tablet (15 mg total) by mouth at bedtime., Disp: 30 tablet, Rfl: 1   Multiple Vitamins-Minerals (MULTIVITAMIN WITH MINERALS) tablet, Take 1 tablet by mouth daily., Disp: , Rfl:    polyethylene glycol powder (GLYCOLAX/MIRALAX) 17 GM/SCOOP powder, Mix 1 capful with water or coffee up to twice daily as needed for constipation, Disp: 3350 g, Rfl: 1   tamsulosin (FLOMAX) 0.4 MG CAPS capsule, TAKE 2 CAPSULES EVERY DAY AFTER SUPPER, Disp: 180 capsule, Rfl: 0   traMADol (ULTRAM) 50 MG tablet, May take 1:59 at night as needed for pain (Patient not taking: Reported on 09/20/2022), Disp: 30 tablet, Rfl: 0   Objective:     BP 122/80 (BP Location: Right Arm, Patient Position: Sitting, Cuff Size: Large)   Pulse 87   Temp 97.7 F (36.5 C) (Temporal)   Ht '5\' 9"'$  (1.753 m)   Wt 227 lb (103 kg)   SpO2 97%   BMI 33.52 kg/m  Wt Readings from Last 3 Encounters:  09/20/22 227 lb (103 kg)  09/06/22 230 lb (104.3 kg)  06/06/22 221 lb 6.4 oz (100.4 kg)      Physical Exam Constitutional:      General: He is not in acute distress.    Appearance: Normal appearance. He is not ill-appearing, toxic-appearing or diaphoretic.  HENT:     Head: Normocephalic and atraumatic.     Right Ear: External ear normal.     Left Ear: External ear normal.     Mouth/Throat:     Mouth: Mucous membranes are moist.     Pharynx: Oropharynx is clear. No oropharyngeal exudate or posterior oropharyngeal erythema.  Eyes:     General: No scleral icterus.       Right eye: No discharge.        Left eye: No discharge.     Extraocular Movements: Extraocular movements intact.     Conjunctiva/sclera: Conjunctivae normal.     Pupils: Pupils are equal, round, and reactive to light.  Cardiovascular:     Rate and Rhythm: Normal rate and regular rhythm.  Pulmonary:      Effort: Pulmonary effort is normal. No respiratory distress.     Breath sounds: Normal breath sounds.  Abdominal:     General: Bowel sounds are normal. There is distension.     Tenderness: There is no abdominal tenderness. There is no guarding or rebound.  Musculoskeletal:     Cervical back: No rigidity or tenderness.  Skin:    General: Skin is warm and dry.  Neurological:     Mental Status: He is alert and oriented to person, place, and time.  Psychiatric:        Mood and Affect: Mood normal.        Behavior: Behavior normal.      No results found for any visits on 09/20/22.    The 10-year ASCVD risk score (Arnett DK, et al., 2019) is: 17%    Assessment & Plan:   Depression with anxiety -     Mirtazapine; Take 1 tablet (15 mg total) by mouth at bedtime.  Dispense: 30 tablet; Refill: 1  Slow transit constipation -     Polyethylene Glycol 3350; Mix 1 capful with water or coffee up to twice daily as needed for constipation  Dispense: 3350 g; Refill: 1  Insomnia due to other mental disorder -     Mirtazapine; Take 1 tablet (15 mg total) by mouth at bedtime.  Dispense: 30 tablet; Refill: 1    Return in about 4 weeks (around 10/18/2022).  Follow-up with urology as today.  Suggested that he discuss his BPH symptoms.  There is more to do about urinary frequency.  Will start mirtazapine nightly.  Warned about daytime somnolence for 3 to 4 days starting medication.  Will use MiraLAX as needed for constipation.  Hopefully this will relieve the bloating.  Information given on bloating and constipation  Libby Maw, MD

## 2022-09-22 ENCOUNTER — Other Ambulatory Visit: Payer: Self-pay | Admitting: Family Medicine

## 2022-09-22 DIAGNOSIS — I10 Essential (primary) hypertension: Secondary | ICD-10-CM

## 2022-10-27 DIAGNOSIS — R3911 Hesitancy of micturition: Secondary | ICD-10-CM | POA: Diagnosis not present

## 2022-10-27 DIAGNOSIS — R3915 Urgency of urination: Secondary | ICD-10-CM | POA: Diagnosis not present

## 2022-10-27 DIAGNOSIS — R3912 Poor urinary stream: Secondary | ICD-10-CM | POA: Diagnosis not present

## 2022-10-27 DIAGNOSIS — R350 Frequency of micturition: Secondary | ICD-10-CM | POA: Diagnosis not present

## 2022-10-27 DIAGNOSIS — C61 Malignant neoplasm of prostate: Secondary | ICD-10-CM | POA: Diagnosis not present

## 2022-11-15 ENCOUNTER — Other Ambulatory Visit: Payer: Self-pay | Admitting: Family Medicine

## 2022-11-15 DIAGNOSIS — N401 Enlarged prostate with lower urinary tract symptoms: Secondary | ICD-10-CM

## 2022-11-15 DIAGNOSIS — F418 Other specified anxiety disorders: Secondary | ICD-10-CM

## 2022-11-15 DIAGNOSIS — F5105 Insomnia due to other mental disorder: Secondary | ICD-10-CM

## 2022-12-19 ENCOUNTER — Ambulatory Visit: Payer: Medicare PPO | Admitting: Endocrinology

## 2023-01-31 ENCOUNTER — Ambulatory Visit: Payer: Medicare PPO | Admitting: Endocrinology

## 2023-03-22 ENCOUNTER — Other Ambulatory Visit: Payer: Self-pay | Admitting: Family Medicine

## 2023-03-22 DIAGNOSIS — E538 Deficiency of other specified B group vitamins: Secondary | ICD-10-CM

## 2023-04-06 ENCOUNTER — Ambulatory Visit: Payer: Medicare PPO | Admitting: Family Medicine

## 2023-04-12 ENCOUNTER — Other Ambulatory Visit: Payer: Self-pay | Admitting: Family Medicine

## 2023-04-12 DIAGNOSIS — E7800 Pure hypercholesterolemia, unspecified: Secondary | ICD-10-CM

## 2023-04-13 ENCOUNTER — Telehealth: Payer: Self-pay | Admitting: Family Medicine

## 2023-04-13 ENCOUNTER — Ambulatory Visit: Payer: Medicare PPO | Admitting: Family Medicine

## 2023-04-13 ENCOUNTER — Encounter: Payer: Self-pay | Admitting: Family Medicine

## 2023-04-13 NOTE — Telephone Encounter (Signed)
Pt was a no show for an OV with Dr Doreene Burke, I sent a no show letter.

## 2023-04-13 NOTE — Telephone Encounter (Signed)
2nd no show, final warning letter sent via mail and mychart to reschedule/notify of policy

## 2023-05-12 ENCOUNTER — Encounter: Payer: Self-pay | Admitting: Family Medicine

## 2023-05-12 ENCOUNTER — Telehealth: Payer: Self-pay

## 2023-05-12 ENCOUNTER — Ambulatory Visit (INDEPENDENT_AMBULATORY_CARE_PROVIDER_SITE_OTHER): Payer: Medicare PPO | Admitting: Family Medicine

## 2023-05-12 ENCOUNTER — Ambulatory Visit: Payer: Medicare PPO | Admitting: Family Medicine

## 2023-05-12 VITALS — BP 120/84 | HR 70 | Temp 97.9°F | Ht 69.0 in | Wt 213.8 lb

## 2023-05-12 DIAGNOSIS — R7309 Other abnormal glucose: Secondary | ICD-10-CM | POA: Diagnosis not present

## 2023-05-12 DIAGNOSIS — S39012A Strain of muscle, fascia and tendon of lower back, initial encounter: Secondary | ICD-10-CM

## 2023-05-12 DIAGNOSIS — E7800 Pure hypercholesterolemia, unspecified: Secondary | ICD-10-CM | POA: Diagnosis not present

## 2023-05-12 DIAGNOSIS — Z8546 Personal history of malignant neoplasm of prostate: Secondary | ICD-10-CM | POA: Diagnosis not present

## 2023-05-12 DIAGNOSIS — I10 Essential (primary) hypertension: Secondary | ICD-10-CM | POA: Diagnosis not present

## 2023-05-12 DIAGNOSIS — F418 Other specified anxiety disorders: Secondary | ICD-10-CM

## 2023-05-12 DIAGNOSIS — E890 Postprocedural hypothyroidism: Secondary | ICD-10-CM | POA: Diagnosis not present

## 2023-05-12 DIAGNOSIS — Z1211 Encounter for screening for malignant neoplasm of colon: Secondary | ICD-10-CM

## 2023-05-12 MED ORDER — METHOCARBAMOL 750 MG PO TABS: 750.0000 mg | ORAL_TABLET | Freq: Three times a day (TID) | ORAL | 0 refills | Status: AC | PRN

## 2023-05-12 MED ORDER — KETOROLAC TROMETHAMINE 60 MG/2ML IM SOLN
60.0000 mg | Freq: Once | INTRAMUSCULAR | Status: AC
  Administered 2023-05-12: 60 mg via INTRAMUSCULAR

## 2023-05-12 MED ORDER — MELOXICAM 7.5 MG PO TABS: 7.5000 mg | ORAL_TABLET | Freq: Every day | ORAL | 0 refills | Status: AC

## 2023-05-12 NOTE — Progress Notes (Signed)
Established Patient Office Visit   Subjective:  Patient ID: Logan Orr, male    DOB: 1951-03-30  Age: 72 y.o. MRN: [id number]  Chief Complaint  Patient presents with   Medical Management of Chronic Issues    Follow up. Pt is not fasting.    Back Pain    Lower back pain x 1 week.     Back Pain Pertinent negatives include no abdominal pain, tingling or weakness.   Encounter Diagnoses  Name Primary?   Strain of lumbar region, initial encounter Yes   Screening for colon cancer    History of prostate cancer    Hypothyroidism, postsurgical    Elevated glucose    Elevated LDL cholesterol level    Depression with anxiety    Essential hypertension    For follow-up of above.  Back at work it is on the job.  They have forgiven him for frequent bathroom breaks.  He continues with tamsulosin.  It is helpful.  History of prostate cancer treated with radioactive seeding.  History of hypothyroidism secondary to surgical removal of his thyroid.  No recent follow-up with endocrinology.  He reports compliance with his levothyroxine 175 mcg each morning on a fasting stomach.  Continues mirtazapine.  It seems to be helpful.  Blood pressure controlled with amlodipine and lisinopril.  Continues atorvastatin for elevated cholesterol.  Due for follow-up colonoscopy.  1 week history of lower back pain that is nonradiating.  Located more on the left side.  He denies paresthesias or weakness in his lower extremities.  No bowel or bladder incontinence.  Follow-up   Review of Systems  Constitutional: Negative.   HENT: Negative.    Eyes:  Negative for blurred vision, discharge and redness.  Respiratory: Negative.    Cardiovascular: Negative.   Gastrointestinal:  Negative for abdominal pain.  Genitourinary: Negative.   Musculoskeletal:  Positive for back pain. Negative for myalgias.  Skin:  Negative for rash.  Neurological:  Negative for tingling, loss of consciousness and weakness.   Endo/Heme/Allergies:  Negative for polydipsia.      05/12/2023    9:25 AM 09/20/2022    1:53 PM 09/20/2022    1:46 PM  Depression screen PHQ 2/9  Decreased Interest 2 0 0  Down, Depressed, Hopeless 2 3 2   PHQ - 2 Score 4 3 2   Altered sleeping 1 3   Tired, decreased energy 0 3   Change in appetite 0 3   Feeling bad or failure about yourself  0 2   Trouble concentrating 0 2   Moving slowly or fidgety/restless 0 0   Suicidal thoughts 0 1   PHQ-9 Score 5 17   Difficult doing work/chores Not difficult at all Somewhat difficult Not difficult at all      Current Outpatient Medications:    amLODipine (NORVASC) 10 MG tablet, TAKE 1 TABLET EVERY DAY, Disp: 90 tablet, Rfl: 3   atorvastatin (LIPITOR) 40 MG tablet, TAKE 1 TABLET EVERY DAY, Disp: 90 tablet, Rfl: 3   cyanocobalamin (VITAMIN B12) 1000 MCG tablet, TAKE 1 TABLET EVERY DAY, Disp: 90 tablet, Rfl: 3   levothyroxine (SYNTHROID) 175 MCG tablet, Take 1 tablet (175 mcg total) by mouth daily., Disp: 90 tablet, Rfl: 3   lisinopril (ZESTRIL) 5 MG tablet, TAKE 1 TABLET EVERY DAY, Disp: 90 tablet, Rfl: 3   meloxicam (MOBIC) 7.5 MG tablet, Take 1 tablet (7.5 mg total) by mouth daily., Disp: 30 tablet, Rfl: 0   methocarbamol (ROBAXIN-750) 750 MG tablet, Take 1  tablet (750 mg total) by mouth every 8 (eight) hours as needed for muscle spasms., Disp: 40 tablet, Rfl: 0   mirtazapine (REMERON SOL-TAB) 15 MG disintegrating tablet, DISSOLVE 1 TABLET ON THE TONGUE AT BEDTIME, Disp: 90 tablet, Rfl: 2   Multiple Vitamins-Minerals (MULTIVITAMIN WITH MINERALS) tablet, Take 1 tablet by mouth daily., Disp: , Rfl:    polyethylene glycol powder (GLYCOLAX/MIRALAX) 17 GM/SCOOP powder, Mix 1 capful with water or coffee up to twice daily as needed for constipation, Disp: 3350 g, Rfl: 1   tamsulosin (FLOMAX) 0.4 MG CAPS capsule, TAKE 2 CAPSULES EVERY DAY AFTER SUPPER, Disp: 180 capsule, Rfl: 3   Objective:     BP 120/84   Pulse 70   Temp 97.9 F (36.6 C)   Ht  5\' 9"  (1.753 m)   Wt 213 lb 12.8 oz (97 kg)   SpO2 96%   BMI 31.57 kg/m    Physical Exam   No results found for any visits on 05/12/23.    The 10-year ASCVD risk score (Arnett DK, et al., 2019) is: 17.1%    Assessment & Plan:   Strain of lumbar region, initial encounter -     Ketorolac Tromethamine -     Meloxicam; Take 1 tablet (7.5 mg total) by mouth daily.  Dispense: 30 tablet; Refill: 0 -     Methocarbamol; Take 1 tablet (750 mg total) by mouth every 8 (eight) hours as needed for muscle spasms.  Dispense: 40 tablet; Refill: 0 -     DG Lumbar Spine Complete; Future  Screening for colon cancer -     Ambulatory referral to Gastroenterology  History of prostate cancer -     PSA  Hypothyroidism, postsurgical -     TSH  Elevated glucose -     Comprehensive metabolic panel -     Hemoglobin A1c  Elevated LDL cholesterol level -     Comprehensive metabolic panel -     Lipid panel  Depression with anxiety  Essential hypertension -     CBC -     Comprehensive metabolic panel -     Urinalysis, Routine w reflex microscopic    Return in about 3 months (around 08/11/2023), or if symptoms worsen or fail to improve.    Mliss Sax, MD   9/20 addendum: Patient left without having his labs drawn.

## 2023-05-12 NOTE — Patient Outreach (Signed)
Care Coordination   In Person Provider Office Visit Note   05/12/2023 Name: Logan Orr MRN: [id number] DOB: 1951/01/21  Logan Orr is a 72 y.o. year old male who sees Logan Sax, MD for primary care. I engaged with Logan Orr in the providers office today.  What matters to the patients health and wellness today?  Health maintenance    Goals Addressed             This Visit's Progress    COMPLETED: Care Coordination Activities-No follow up required       Care Coordination Interventions: Advised patient to Annual Wellness exam. Discussed [facility] services and support. Assessed SDOH. Advised to discuss with primary care physician if services needed in the future.         SDOH assessments and interventions completed:  Yes  SDOH Interventions Today    Flowsheet Row Most Recent Value  SDOH Interventions   Food Insecurity Interventions Intervention Not Indicated  Health Literacy Interventions Intervention Not Indicated        Care Coordination Interventions:  Yes, provided   Follow up plan: No further intervention required.   Encounter Outcome:  Patient Visit Completed   Bary Leriche, RN, MSN [facility]  [facility], [facility] Management Community Coordinator Direct Dial: [phone]  Fax: [phone] Website: [email or web address]

## 2023-05-12 NOTE — Patient Instructions (Signed)
Visit Information  Thank you for taking time to visit with me today. Please don't hesitate to contact me if I can be of assistance to you.   Following are the goals we discussed today:   Goals Addressed             This Visit's Progress    COMPLETED: Care Coordination Activities-No follow up required       Care Coordination Interventions: Advised patient to Annual Wellness exam. Discussed [facility] services and support. Assessed SDOH. Advised to discuss with primary care physician if services needed in the future.          If you are experiencing a Mental Health or Behavioral Health Crisis or need someone to talk to, please call the Suicide and Crisis Lifeline: 988   Patient verbalizes understanding of instructions and care plan provided today and agrees to view in MyChart. Active MyChart status and patient understanding of how to access instructions and care plan via MyChart confirmed with patient.     The patient has been provided with contact information for the care management team and has been advised to call with any health related questions or concerns.   Bary Leriche, RN, MSN [facility], [facility] Management Community Coordinator Direct Dial: [phone]  Fax: [phone] Website: [email or web address]

## 2023-05-24 ENCOUNTER — Other Ambulatory Visit (HOSPITAL_COMMUNITY): Payer: Self-pay

## 2023-05-27 ENCOUNTER — Other Ambulatory Visit: Payer: Self-pay | Admitting: Family Medicine

## 2023-05-27 DIAGNOSIS — I10 Essential (primary) hypertension: Secondary | ICD-10-CM

## 2023-07-20 ENCOUNTER — Other Ambulatory Visit: Payer: Self-pay | Admitting: Family Medicine

## 2023-07-20 DIAGNOSIS — I10 Essential (primary) hypertension: Secondary | ICD-10-CM

## 2023-08-11 ENCOUNTER — Ambulatory Visit: Payer: Medicare PPO | Admitting: Family Medicine

## 2023-08-11 ENCOUNTER — Telehealth: Payer: Self-pay | Admitting: Family Medicine

## 2023-08-11 NOTE — Telephone Encounter (Signed)
04/06/2023 same day cancellation 04/13/2023 no show & final warning sent 08/11/2023 no show  Dismissal generated

## 2023-10-24 ENCOUNTER — Other Ambulatory Visit: Payer: Self-pay

## 2023-10-24 ENCOUNTER — Encounter: Payer: Self-pay | Admitting: Endocrinology

## 2023-10-24 ENCOUNTER — Ambulatory Visit (INDEPENDENT_AMBULATORY_CARE_PROVIDER_SITE_OTHER): Payer: Medicare PPO | Admitting: Endocrinology

## 2023-10-24 VITALS — BP 132/80 | HR 80 | Ht 69.0 in | Wt 234.0 lb

## 2023-10-24 DIAGNOSIS — R7303 Prediabetes: Secondary | ICD-10-CM | POA: Diagnosis not present

## 2023-10-24 DIAGNOSIS — C73 Malignant neoplasm of thyroid gland: Secondary | ICD-10-CM | POA: Diagnosis not present

## 2023-10-24 DIAGNOSIS — E890 Postprocedural hypothyroidism: Secondary | ICD-10-CM | POA: Diagnosis not present

## 2023-10-24 NOTE — Patient Instructions (Signed)
 Labs today.  Radiology will call you for ultrasound neck.

## 2023-10-24 NOTE — Progress Notes (Signed)
 Outpatient Endocrinology Note [place] Myles Tavella, MD   Patient's Name: Logan Orr    DOB: Jul 14, 1951    MRN: [id number]  REASON OF VISIT: Follow-up of papillary thyroid cancer and postsurgical hypothyroidism.  PCP: Patient, No Pcp Per  HISTORY OF PRESENT ILLNESS:   Logan Orr is a 73 y.o. old male with past medical history as listed below is presented to follow-up of papillary thyroid cancer and postsurgical hypothyroidism.  Thyroid cancer history: Patient has history of papillary thyroid carcinoma unifocal 1.8 cm on left isthmus status post total thyroidectomy in May 2022, no RAI ablation, thyroid cancer surveillance and postsurgical hypothyroidism.  Background: Patient first discovered to have thyroid enlargement in September 2021 on routine exam by PCP, ultrasound thyroid showed 2 cm left thyroid nodule with irregular border, needle biopsy in September 09, 2020 showed suspicious for malignancy although a scant cellularity reported.  Patient underwent total thyroidectomy in May 2022.  Diagnosis: Papillary thyroid carcinoma.  Surgical treatment: Total thyroidectomy in May 2022.  Pathology: Papillary thyroid carcinoma 1.8 cm, no extrathyroidal extension, no angiolymphatic invasion.  Radioiodine treatment: none  Follow-up evaluation / surveillance:  Nuclear medicine body scan: n/a - Ultrasound of thyroid bed: none - Thyroglobulin: Elevated thyroglobulin of 2.5 in August 2022, and 8.1 in January 2024 with TSH of 48.  Thyroglobulin antibody negative.  Postsurgical hypothyroidism: -Patient was taking levothyroxine 125 mcg daily postsurgically, was increased to 175 mcg daily in January 2024 when TSH was 48.  Interval history Patient presented for the follow-up of thyroid cancer and postsurgical hypothyroidism.  Patient was last seen by Dr. Lucianne Muss in January 2024.  There was a plan to follow-up in 2 months, due to elevated TSH, adjusted dose of levothyroxine and elevated  thyroglobulin.  Noted that patient canceled follow-up visit in this clinic in April 2024 and June 2024.  Patient has been taking levothyroxine 175 mcg daily, he reports taking in the morning before breakfast about 30 to 40 minutes, reports compliance.  He has complaints of dry mouth and throat, weight gain.  He also complains of not able to sleep.  He has dry skin.  He has not noticed any change in voice.  No lump in the neck.  No palpitation or heat intolerance.  Patient complains of dry mouth, denies frequent urination or increased thirst.  He has history of prediabetes with hemoglobin A1c of 6.4% in June 2022 and 6.2% in January 2023.  REVIEW OF SYSTEMS:  As per history of present illness.   PAST MEDICAL HISTORY: Past Medical History:  Diagnosis Date   Anginal pain (HCC) 2013   hospital for 5 days in [place]   Dyspnea 2013   Due to lung infection   Hemorrhoids    History of colon polyps    HLD (hyperlipidemia)    Hyperplasia of prostate with lower urinary tract symptoms (LUTS)    Hypertension    Pneumonia 2013   left side infection while in [place]   Prostate cancer [facility]) urologist-- dr bell/  oncologist-- dr Kathrynn Running   dx 11-23-2018  Stage T1c,  3+4   Thyroid nodule    08-03-2018  pt pcp note in epic, left side, ultasound ordered    Wears glasses     PAST SURGICAL HISTORY: Past Surgical History:  Procedure Laterality Date   COLONOSCOPY     CYSTOSCOPY N/A 02/04/2019   Procedure: CYSTOSCOPY;  Surgeon: Crista Elliot, MD;  Location: [facility];  Service: Urology;  Laterality: N/A;  no seeds seen  per Dr Alvester Morin   EYE SURGERY Bilateral 2020   LACERATION REPAIR  1990s   repair left lower arm complex laceration    PROSTATE BIOPSY  11-23-2018   at dr Alvester Morin office   RADIOACTIVE SEED IMPLANT N/A 02/04/2019   Procedure: RADIOACTIVE SEED IMPLANT/BRACHYTHERAPY IMPLANT;  Surgeon: Crista Elliot, MD;  Location: [facility];  Service: Urology;  Laterality:  N/A;  90 seeds   SPACE OAR INSTILLATION N/A 02/04/2019   Procedure: SPACE OAR INSTILLATION;  Surgeon: Crista Elliot, MD;  Location[facility];  Service: Urology;  Laterality: N/A;   THYROIDECTOMY N/A 01/07/2021   Procedure: TOTAL THYROIDECTOMY;  Surgeon: Darnell Level, MD;  Location: [facility];  Service: General;  Laterality: N/A;    ALLERGIES: No Known Allergies  FAMILY HISTORY:  Family History  Adopted: Yes  Problem Relation Age of Onset   Thyroid disease Neg Hx     SOCIAL HISTORY: Social History   Socioeconomic History   Marital status: Single    Spouse name: Not on file   Number of children: 1   Years of education: Not on file   Highest education level: Not on file  Occupational History    Comment: unemployed   Tobacco Use   Smoking status: Never   Smokeless tobacco: Never  Vaping Use   Vaping status: Never Used  Substance and Sexual Activity   Alcohol use: Yes    Alcohol/week: 21.0 standard drinks of alcohol    Types: 21 Cans of beer per week    Comment: 3-4 beers daily   Drug use: Never   Sexual activity: Yes  Other Topics Concern   Not on file  Social History Narrative   Not on file   Social Drivers of Health   Financial Resource Strain: Low Risk  (05/17/2022)   Overall Financial Resource Strain (CARDIA)    Difficulty of Paying Living Expenses: Not hard at all  Food Insecurity: No Food Insecurity (05/12/2023)   Hunger Vital Sign    Worried About Running Out of Food in the Last Year: Never true    Ran Out of Food in the Last Year: Never true  Transportation Needs: No Transportation Needs (05/17/2022)   PRAPARE - Administrator, Civil Service (Medical): No    Lack of Transportation (Non-Medical): No  Physical Activity: Sufficiently Active (05/17/2022)   Exercise Vital Sign    Days of Exercise per Week: 7 days    Minutes of Exercise per Session: 30 min  Stress: No Stress Concern Present (05/17/2022)   [employer] of  Occupational Health - Occupational Stress Questionnaire    Feeling of Stress : Not at all  Social Connections: Socially Isolated (05/17/2022)   Social Connection and Isolation Panel [NHANES]    Frequency of Communication with Friends and Family: More than three times a week    Frequency of Social Gatherings with Friends and Family: More than three times a week    Attends Religious Services: Never    Database administrator or Organizations: No    Attends Engineer, structural: Never    Marital Status: Divorced    MEDICATIONS:  Current Outpatient Medications  Medication Sig Dispense Refill   amLODipine (NORVASC) 10 MG tablet TAKE 1 TABLET EVERY DAY 90 tablet 3   atorvastatin (LIPITOR) 40 MG tablet TAKE 1 TABLET EVERY DAY 90 tablet 3   cyanocobalamin (VITAMIN B12) 1000 MCG tablet TAKE 1 TABLET EVERY DAY 90 tablet 3  levothyroxine (SYNTHROID) 175 MCG tablet Take 1 tablet (175 mcg total) by mouth daily. 90 tablet 3   lisinopril (ZESTRIL) 5 MG tablet TAKE 1 TABLET EVERY DAY 60 tablet 5   meloxicam (MOBIC) 7.5 MG tablet Take 1 tablet (7.5 mg total) by mouth daily. 30 tablet 0   methocarbamol (ROBAXIN-750) 750 MG tablet Take 1 tablet (750 mg total) by mouth every 8 (eight) hours as needed for muscle spasms. 40 tablet 0   mirtazapine (REMERON SOL-TAB) 15 MG disintegrating tablet DISSOLVE 1 TABLET ON THE TONGUE AT BEDTIME 90 tablet 2   Multiple Vitamins-Minerals (MULTIVITAMIN WITH MINERALS) tablet Take 1 tablet by mouth daily.     polyethylene glycol powder (GLYCOLAX/MIRALAX) 17 GM/SCOOP powder Mix 1 capful with water or coffee up to twice daily as needed for constipation 3350 g 1   tamsulosin (FLOMAX) 0.4 MG CAPS capsule TAKE 2 CAPSULES EVERY DAY AFTER SUPPER 180 capsule 3   No current facility-administered medications for this visit.    PHYSICAL EXAM: Vitals:   10/24/23 1423  BP: 132/80  Pulse: 80  SpO2: 98%  Weight: 234 lb (106.1 kg)  Height: 5\' 9"  (1.753 m)   Body mass index  is 34.56 kg/m.  Wt Readings from Last 3 Encounters:  10/24/23 234 lb (106.1 kg)  05/12/23 213 lb 12.8 oz (97 kg)  09/20/22 227 lb (103 kg)     General: Well developed, well nourished male in no apparent distress.  HEENT: AT/[place], no external lesions. Hearing intact to the spoken word Eyes: EOMI. Conjunctiva clear and no icterus. Neck: Trachea midline, neck supple without appreciable lymphadenopathy. Thyroidectomy scar is noted.  Lungs: Clear to auscultation, no wheeze. Respirations not labored Heart: S1S2, Regular in rate and rhythm.  Abdomen: Soft, non tender, non distended Neurologic: Alert, oriented, normal speech, deep tendon biceps reflexes 1+,  no gross focal neurological deficit Extremities: No pedal pitting edema, no tremors of outstretched hands Skin: Warm, color good.  Dry skin. Psychiatric: Does not appear depressed or anxious  PERTINENT HISTORIC LABORATORY AND IMAGING STUDIES:   FINAL MICROSCOPIC DIAGNOSIS: Jan 07, 2021  A. THYROID GLAND, TOTAL THYROIDECTOMY: - Papillary carcinoma, 1.8 cm. - No extrathyroidal extension identified. - Margins of resection are not involved. - See oncology table.  ONCOLOGY TABLE:  THYROID GLAND, CARCINOMA: Resection  Procedure: Total thyroidectomy Tumor Focality: Unifocal Tumor Site: Mid left lobe, near the isthmus Tumor Size: Greatest dimension: 1.8 cm Histologic Type: Papillary carcinoma Angioinvasion: Not identified Lymphatic Invasion: Not identified Extrathyroidal Extension: Not identified Margin Status: All margins negative for invasive carcinoma Regional Lymph Node Status: Not applicable (no regional lymph nodes submitted or found) Distant Metastasis:      Distant Site(s) Involved: Not applicable Pathologic Stage Classification (pTNM, AJCC 8th Edition): pT1b, pN not assigned Additional Findings: Nodular hyperplasia Ancillary Studies: Can be performed upon request Representative Tumor Block: A2 (v4.3.0.0)    Latest  Reference Range & Units 04/14/21 08:20 09/06/22 14:27  Thyroglobulin Antibody 0.0 - 0.9 IU/mL <1.0 <1.0  Thyroglobulin by IMA 1.4 - 29.2 ng/mL 2.5 8.1    Latest Reference Range & Units 04/14/21 08:20 05/23/22 14:35 09/06/22 14:27  TSH 0.35 - 5.50 uIU/mL 3.140 3.030 5.04 48.57 (H)  T4,Free(Direct) 0.60 - 1.60 ng/dL 1.61 0.96  0.45 (L)  (H): Data is abnormally high (L): Data is abnormally low  ASSESSMENT / PLAN  1. Papillary thyroid carcinoma (HCC)   2. Hypothyroidism, postsurgical   3. Prediabetes     - Papillary thyroid cancer 1.8 cm, and underwent  a total thyroidectomy in 12/2020.  No radioactive iodine treatment. - As per ATA guideline cancer recurrence risk low, will keep goal TSH low normal range. -Patient had elevated thyroglobulin postoperatively was 2.5 after 3 months of  thyroid surgery and was 8.1 in January 2024 however was stimulated thyroglobulin with TSH of 48 at that time.  Patient lost follow-up with endocrinology, was planned to be seen in 2 months after January 2024. -Patient has been taking levothyroxine 175 mcg daily.  Reports compliance.  He is clinically hypothyroid today. -Discussed that it is important to be compliant with levothyroxine to maintain thyroid hormone level as he does not have thyroid gland anymore and also to continue thyroid hormone suppression therapy with levothyroxine.  We need to monitor having history of thyroid cancer closely and also maintain normal thyroid hormone levels with levothyroxine.  Generally there is a low risks of recurrence of thyroid cancer however rarely it may recur. -Discussed about compliance with follow-ups.  Plan: -Check thyroid function test TSH, FT4, will adjust the dose of levothyroxine as needed based on test results. -Follow-up urine and thyroglobulin antibody. -Check ultrasound neck for thyroid cancer surveillance. -Rest of the plan based on the test results planned today.  # Prediabetes -Will check hemoglobin A1c  and BMP today.  [place] was seen today for follow-up.  Diagnoses and all orders for this visit:  Papillary thyroid carcinoma (HCC) -     Thyroglobulin, LC/MS/MS -     Thyroglobulin antibody -     US THYROID; Future  Hypothyroidism, postsurgical -     T4, free -     TSH  Prediabetes -     Hemoglobin A1c -     BASIC METABOLIC PANEL WITH GFR    DISPOSITION Follow up in clinic in 2 months suggested.  All questions answered and patient verbalized understanding of the plan.  [place] Enas Winchel, MD [facility] Endocrinology [facility] [address] [place], [address] [place], [place] [address] Phone # [phone]  At least part of this note was generated using voice recognition software. Inadvertent word errors may have occurred, which were not recognized during the proofreading process.

## 2023-10-25 ENCOUNTER — Encounter: Payer: Self-pay | Admitting: Endocrinology

## 2023-10-25 ENCOUNTER — Telehealth: Payer: Self-pay

## 2023-10-25 MED ORDER — LEVOTHYROXINE SODIUM 200 MCG PO TABS: 200.0000 ug | ORAL_TABLET | Freq: Every day | ORAL | 3 refills | Status: DC

## 2023-10-25 NOTE — Addendum Note (Signed)
 Addended by: Icholas Irby, [place] on: 10/25/2023 11:37 AM   Modules accepted: Orders

## 2023-10-25 NOTE — Telephone Encounter (Signed)
 Patient given results and medication changes as directed by MD. No further questions at this time.

## 2023-10-25 NOTE — Telephone Encounter (Signed)
-----   Message from [place] Thapa sent at 10/25/2023 11:38 AM EST ----- Please notify patient of , preferably call the patient. Labs reviewed significant elevated TSH, current dose of levothyroxine 175 mcg daily, I would like to increase the dose of levothyroxine to 200 mcg daily.  Thyroglobulin tumor marker result awaited.  Will recheck labs at the time of follow-up visit in April.  I have sent new prescription for the levothyroxine.

## 2023-10-27 ENCOUNTER — Ambulatory Visit
Admission: RE | Admit: 2023-10-27 | Discharge: 2023-10-27 | Disposition: A | Discharge status: Home / Self Care | Source: Ambulatory Visit | Attending: Endocrinology | Admitting: Endocrinology

## 2023-10-27 DIAGNOSIS — C73 Malignant neoplasm of thyroid gland: Secondary | ICD-10-CM

## 2023-10-30 ENCOUNTER — Encounter: Payer: Self-pay | Admitting: Endocrinology

## 2023-11-02 LAB — BASIC METABOLIC PANEL WITH GFR
BUN/Creatinine Ratio: 14 (calc) (ref 6–22)
BUN: 23 mg/dL (ref 7–25)
CO2: 26 mmol/L (ref 20–32)
Calcium: 10.3 mg/dL (ref 8.6–10.3)
Chloride: 102 mmol/L (ref 98–110)
Creat: 1.7 mg/dL — ABNORMAL HIGH (ref 0.70–1.28)
Glucose, Bld: 89 mg/dL (ref 65–99)
Potassium: 4.1 mmol/L (ref 3.5–5.3)
Sodium: 139 mmol/L (ref 135–146)
eGFR: 43 mL/min/{1.73_m2} — ABNORMAL LOW (ref >=60)

## 2023-11-02 LAB — TSH: TSH: 66.43 m[IU]/L — ABNORMAL HIGH (ref 0.40–4.50)

## 2023-11-02 LAB — T4, FREE: Free T4: 0.3 ng/dL — ABNORMAL LOW (ref 0.8–1.8)

## 2023-11-02 LAB — HEMOGLOBIN A1C
Hgb A1c MFr Bld: 6.3 %{Hb} — ABNORMAL HIGH (ref <5.7)
Mean Plasma Glucose: 134 mg/dL
eAG (mmol/L): 7.4 mmol/L

## 2023-11-02 LAB — THYROGLOBULIN, LC/MS/MS: THYROGLOBULIN, LC/MS/MS: 17.6 ng/mL — ABNORMAL HIGH

## 2023-11-02 LAB — THYROGLOBULIN ANTIBODY: Thyroglobulin Ab: <1 [IU]/mL (ref <=1)

## 2023-11-06 NOTE — Telephone Encounter (Signed)
 You have the thyroid cancer marker elevated which is called thyroglobulin.  Thyroglobulin in your case is probably elevated because your thyroid hormone level which is called TSH is also significantly elevated.  Thyroglobulin comes from the thyroid cells it can be from normal thyroid cell or cancerous thyroid cell.  This is very important to keep your thyroid hormone level TSH in the normal range by taking levothyroxine every day on the right dose.    Now going forward we need to recheck your thyroglobulin level after making your thyroid hormone level TSH normal.  If your thyroglobulin remained high even after making your thyroid hormone level in the normal range we may have to do additional testings to find out if there is any thyroid tissue left behind in your body/in the neck.    Although your ultrasound thyroid of the neck is normal it can still have a laboratory result abnormal.  Ultrasound thyroid may  not sometimes sees a small cells of thyroid left behind in the neck.  For now I would recommend to continue on the current dose of levothyroxine, I would recommend to continue on the current dose of levothyroxine and we will make further blood test and plan on the follow-up visit on April 11.  Please let me know if have any more questions.  [place] Fiza Nation, MD [facility] Endocrinology [facility] [address] [place], [address] [place], [place] [address] Phone # [phone]

## 2023-12-01 ENCOUNTER — Ambulatory Visit: Admitting: Endocrinology

## 2023-12-01 ENCOUNTER — Encounter: Payer: Self-pay | Admitting: Endocrinology

## 2023-12-01 VITALS — BP 118/66 | HR 101 | Resp 20 | Ht 69.0 in | Wt 229.8 lb

## 2023-12-01 DIAGNOSIS — E890 Postprocedural hypothyroidism: Secondary | ICD-10-CM

## 2023-12-01 DIAGNOSIS — C73 Malignant neoplasm of thyroid gland: Secondary | ICD-10-CM | POA: Diagnosis not present

## 2023-12-01 DIAGNOSIS — R7303 Prediabetes: Secondary | ICD-10-CM | POA: Diagnosis not present

## 2023-12-01 NOTE — Progress Notes (Signed)
 Outpatient Endocrinology Note [place] Katrine Radich, MD   Patient's Name: Logan Orr    DOB: [birth year]    MRN: [id number]  REASON OF VISIT: Follow-up of papillary thyroid cancer and postsurgical hypothyroidism.  PCP: Patient, No Pcp Per  HISTORY OF PRESENT ILLNESS:   Logan Orr is a 73 y.o. old male with past medical history as listed below is presented to follow-up of papillary thyroid cancer and postsurgical hypothyroidism.  Thyroid cancer history: Patient has history of papillary thyroid carcinoma unifocal 1.8 cm on left isthmus status post total thyroidectomy in May 2022, no RAI ablation, thyroid cancer surveillance and postsurgical hypothyroidism.  Background: Patient first discovered to have thyroid enlargement in September 2021 on routine exam by PCP, ultrasound thyroid showed 2 cm left thyroid nodule with irregular border, needle biopsy in September 09, 2020 showed suspicious for malignancy although a scant cellularity reported.  Patient underwent total thyroidectomy in May 2022.  Diagnosis: Papillary thyroid carcinoma.  Surgical treatment: Total thyroidectomy in May 2022.  Pathology: Papillary thyroid carcinoma 1.8 cm, no extrathyroidal extension, no angiolymphatic invasion.  Radioiodine treatment: none  Follow-up evaluation / surveillance:  Nuclear medicine body scan: n/a - Ultrasound of thyroid bed: Ultrasound neck in March 2025 with negative for thyroid cancer recurrence. - Thyroglobulin: Elevated thyroglobulin of 2.5 in August 2022, and 8.1 in January 2024 with TSH of 48.  Thyroglobulin antibody negative.  In March 2025 TSH 66 with thyroglobulin 17.6 representing stimulated thyroglobulin.  Postsurgical hypothyroidism: -Patient was taking levothyroxine 125 mcg daily postsurgically, was increased to 175 mcg daily in January 2024 when TSH was 48.  Levothyroxine was increased to 200 mcg daily in March.  2025 when TSH was 66.  Interval history Patient has been taking  levothyroxine 200 mg daily.  Reports compliance and taking in the morning.  He still complains of fatigue.  No neck lump.  No palpitation or heat intolerance.  Last month he had high TSH of 66 and elevated thyroglobulin.  REVIEW OF SYSTEMS:  As per history of present illness.   PAST MEDICAL HISTORY: Past Medical History:  Diagnosis Date   Anginal pain (HCC) 2013   hospital for 5 days in [place]   Dyspnea 2013   Due to lung infection   Hemorrhoids    History of colon polyps    HLD (hyperlipidemia)    Hyperplasia of prostate with lower urinary tract symptoms (LUTS)    Hypertension    Pneumonia 2013   left side infection while in [place]   Prostate cancer [facility]) urologist-- dr bell/  oncologist-- dr Kathrynn Running   dx 11-23-2018  Stage T1c,  3+4   Thyroid nodule    08-03-2018  pt pcp note in epic, left side, ultasound ordered    Wears glasses     PAST SURGICAL HISTORY: Past Surgical History:  Procedure Laterality Date   COLONOSCOPY     CYSTOSCOPY N/A 02/04/2019   Procedure: CYSTOSCOPY;  Surgeon: Crista Elliot, MD;  Location: [facility];  Service: Urology;  Laterality: N/A;  no seeds seen per Dr Alvester Morin   EYE SURGERY Bilateral 2020   LACERATION REPAIR  1990s   repair left lower arm complex laceration    PROSTATE BIOPSY  11-23-2018   at dr Alvester Morin office   RADIOACTIVE SEED IMPLANT N/A 02/04/2019   Procedure: RADIOACTIVE SEED IMPLANT/BRACHYTHERAPY IMPLANT;  Surgeon: Crista Elliot, MD;  Location: [facility];  Service: Urology;  Laterality: N/A;  90 seeds   SPACE OAR INSTILLATION N/A  02/04/2019   Procedure: SPACE OAR INSTILLATION;  Surgeon: Crista Elliot, MD;  Location: [facility];  Service: Urology;  Laterality: N/A;   THYROIDECTOMY N/A 01/07/2021   Procedure: TOTAL THYROIDECTOMY;  Surgeon: Darnell Level, MD;  Location: [facility];  Service: General;  Laterality: N/A;    ALLERGIES: No Known Allergies  FAMILY HISTORY:  Family History   Adopted: Yes  Problem Relation Age of Onset   Thyroid disease Neg Hx     SOCIAL HISTORY: Social History   Socioeconomic History   Marital status: Single    Spouse name: Not on file   Number of children: 1   Years of education: Not on file   Highest education level: Not on file  Occupational History    Comment: unemployed   Tobacco Use   Smoking status: Never   Smokeless tobacco: Never  Vaping Use   Vaping status: Never Used  Substance and Sexual Activity   Alcohol use: Yes    Alcohol/week: 21.0 standard drinks of alcohol    Types: 21 Cans of beer per week    Comment: 3-4 beers daily   Drug use: Never   Sexual activity: Yes  Other Topics Concern   Not on file  Social History Narrative   Not on file   Social Drivers of Health   Financial Resource Strain: Low Risk  (05/17/2022)   Overall Financial Resource Strain (CARDIA)    Difficulty of Paying Living Expenses: Not hard at all  Food Insecurity: No Food Insecurity (05/12/2023)   Hunger Vital Sign    Worried About Running Out of Food in the Last Year: Never true    Ran Out of Food in the Last Year: Never true  Transportation Needs: No Transportation Needs (05/17/2022)   PRAPARE - Administrator, Civil Service (Medical): No    Lack of Transportation (Non-Medical): No  Physical Activity: Sufficiently Active (05/17/2022)   Exercise Vital Sign    Days of Exercise per Week: 7 days    Minutes of Exercise per Session: 30 min  Stress: No Stress Concern Present (05/17/2022)   [employer] of Occupational Health - Occupational Stress Questionnaire    Feeling of Stress : Not at all  Social Connections: Socially Isolated (05/17/2022)   Social Connection and Isolation Panel [NHANES]    Frequency of Communication with Friends and Family: More than three times a week    Frequency of Social Gatherings with Friends and Family: More than three times a week    Attends Religious Services: Never    Database administrator  or Organizations: No    Attends Engineer, structural: Never    Marital Status: Divorced    MEDICATIONS:  Current Outpatient Medications  Medication Sig Dispense Refill   amLODipine (NORVASC) 10 MG tablet TAKE 1 TABLET EVERY DAY 90 tablet 3   atorvastatin (LIPITOR) 40 MG tablet TAKE 1 TABLET EVERY DAY 90 tablet 3   cyanocobalamin (VITAMIN B12) 1000 MCG tablet TAKE 1 TABLET EVERY DAY 90 tablet 3   levothyroxine (SYNTHROID) 200 MCG tablet Take 1 tablet (200 mcg total) by mouth daily. 90 tablet 3   lisinopril (ZESTRIL) 5 MG tablet TAKE 1 TABLET EVERY DAY 60 tablet 5   meloxicam (MOBIC) 7.5 MG tablet Take 1 tablet (7.5 mg total) by mouth daily. 30 tablet 0   methocarbamol (ROBAXIN-750) 750 MG tablet Take 1 tablet (750 mg total) by mouth every 8 (eight) hours as needed for muscle spasms. 40  tablet 0   mirtazapine (REMERON SOL-TAB) 15 MG disintegrating tablet DISSOLVE 1 TABLET ON THE TONGUE AT BEDTIME 90 tablet 2   Multiple Vitamins-Minerals (MULTIVITAMIN WITH MINERALS) tablet Take 1 tablet by mouth daily.     polyethylene glycol powder (GLYCOLAX/MIRALAX) 17 GM/SCOOP powder Mix 1 capful with water or coffee up to twice daily as needed for constipation 3350 g 1   tamsulosin (FLOMAX) 0.4 MG CAPS capsule TAKE 2 CAPSULES EVERY DAY AFTER SUPPER 180 capsule 3   No current facility-administered medications for this visit.    PHYSICAL EXAM: Vitals:   12/01/23 0947  BP: 118/66  Pulse: (!) 101  Resp: 20  SpO2: 96%  Weight: 229 lb 12.8 oz (104.2 kg)  Height: 5\' 9"  (1.753 m)   Body mass index is 33.94 kg/m.  Wt Readings from Last 3 Encounters:  12/01/23 229 lb 12.8 oz (104.2 kg)  10/24/23 234 lb (106.1 kg)  05/12/23 213 lb 12.8 oz (97 kg)     General: Well developed, well nourished male in no apparent distress.  HEENT: AT/[place], no external lesions. Hearing intact to the spoken word Eyes: EOMI. Conjunctiva clear and no icterus. Neck: Trachea midline, neck supple without appreciable  lymphadenopathy. Thyroidectomy scar is noted.  Lungs: Clear to auscultation, no wheeze. Respirations not labored Heart: S1S2, Regular in rate and rhythm.  Abdomen: Soft, non tender, non distended Neurologic: Alert, oriented, normal speech, deep tendon biceps reflexes 1+,  no gross focal neurological deficit Extremities: No pedal pitting edema, no tremors of outstretched hands Skin: Warm, color good.  Dry skin. Psychiatric: Does not appear depressed or anxious  PERTINENT HISTORIC LABORATORY AND IMAGING STUDIES:   FINAL MICROSCOPIC DIAGNOSIS: Jan 07, 2021  A. THYROID GLAND, TOTAL THYROIDECTOMY: - Papillary carcinoma, 1.8 cm. - No extrathyroidal extension identified. - Margins of resection are not involved. - See oncology table.  ONCOLOGY TABLE:  THYROID GLAND, CARCINOMA: Resection  Procedure: Total thyroidectomy Tumor Focality: Unifocal Tumor Site: Mid left lobe, near the isthmus Tumor Size: Greatest dimension: 1.8 cm Histologic Type: Papillary carcinoma Angioinvasion: Not identified Lymphatic Invasion: Not identified Extrathyroidal Extension: Not identified Margin Status: All margins negative for invasive carcinoma Regional Lymph Node Status: Not applicable (no regional lymph nodes submitted or found) Distant Metastasis:      Distant Site(s) Involved: Not applicable Pathologic Stage Classification (pTNM, AJCC 8th Edition): pT1b, pN not assigned Additional Findings: Nodular hyperplasia Ancillary Studies: Can be performed upon request Representative Tumor Block: A2 (v4.3.0.0)    Latest Reference Range & Units 04/14/21 08:20 09/06/22 14:27  Thyroglobulin Antibody 0.0 - 0.9 IU/mL <1.0 <1.0  Thyroglobulin by IMA 1.4 - 29.2 ng/mL 2.5 8.1    Latest Reference Range & Units 04/14/21 08:20 05/23/22 14:35 09/06/22 14:27  TSH 0.35 - 5.50 uIU/mL 3.140 3.030 5.04 48.57 (H)  T4,Free(Direct) 0.60 - 1.60 ng/dL 1.61 0.96  0.45 (L)  (H): Data is abnormally high (L): Data is  abnormally low  ASSESSMENT / PLAN  1. Papillary thyroid carcinoma (HCC)   2. Hypothyroidism, postsurgical   3. Prediabetes    - Papillary thyroid cancer 1.8 cm, and underwent a total thyroidectomy in 12/2020.  No radioactive iodine treatment. - As per ATA guideline cancer recurrence risk low, will keep goal TSH low normal range. -Patient had elevated thyroglobulin postoperatively was 2.5 after 3 months of  thyroid surgery and was 8.1 in January 2024 however was stimulated thyroglobulin with TSH of 48 at that time.  Patient lost follow-up with endocrinology, was planned to be seen in  2 months after January 2024. -In March 2025 patient had TSH 66 with thyroglobulin 17.6.  Levothyroxine was increased to 200 mcg daily at that time.  -Discussed that it is important to be compliant with levothyroxine to maintain thyroid hormone level as he does not have thyroid gland anymore and also to continue thyroid hormone suppression therapy with levothyroxine.  We need to monitor having history of thyroid cancer closely and also maintain normal thyroid hormone levels with levothyroxine.  Generally there is a low risks of recurrence of thyroid cancer however rarely it may recur. -Discussed about compliance with follow-ups. -He is currently taking levothyroxine 200 mcg daily.  Plan: -Check thyroid function test TSH, FT4, will adjust the dose of levothyroxine as needed based on test results. -Check thyroglobulin and thyroglobulin antibody to follow the trend.  Expect to improve with improving TSH. -If thyroglobulin remains significantly elevated after TSH at the goal, we will plan for radioactive iodine whole-body scan.  # Prediabetes -Discussed about lifestyle modification with diet control and exercise/increased physical activity.  Diagnoses and all orders for this visit:  Papillary thyroid carcinoma (HCC) -     Thyroglobulin, LC/MS/MS -     Thyroglobulin antibody  Hypothyroidism, postsurgical -      T4, free -     TSH  Prediabetes    DISPOSITION Follow up in clinic in 2 months suggested.  All questions answered and patient verbalized understanding of the plan.  [place] Lasha Echeverria, MD [facility] Endocrinology [facility] [address] [place], [address] [place], [place] [address] Phone # [phone]  At least part of this note was generated using voice recognition software. Inadvertent word errors may have occurred, which were not recognized during the proofreading process.

## 2023-12-10 LAB — THYROGLOBULIN, LC/MS/MS: THYROGLOBULIN, LC/MS/MS: 2.3 ng/mL — ABNORMAL HIGH

## 2023-12-10 LAB — THYROGLOBULIN ANTIBODY: Thyroglobulin Ab: <1 [IU]/mL (ref <=1)

## 2023-12-10 LAB — T4, FREE: Free T4: 1.3 ng/dL (ref 0.8–1.8)

## 2023-12-10 LAB — TSH: TSH: 5.28 m[IU]/L — ABNORMAL HIGH (ref 0.40–4.50)

## 2023-12-12 ENCOUNTER — Encounter: Payer: Self-pay | Admitting: Endocrinology

## 2023-12-12 ENCOUNTER — Other Ambulatory Visit: Payer: Self-pay | Admitting: Endocrinology

## 2023-12-12 ENCOUNTER — Telehealth: Payer: Self-pay

## 2023-12-12 DIAGNOSIS — C73 Malignant neoplasm of thyroid gland: Secondary | ICD-10-CM

## 2023-12-12 MED ORDER — LEVOTHYROXINE SODIUM 112 MCG PO TABS: 224.0000 ug | ORAL_TABLET | Freq: Every day | ORAL | 3 refills | Status: AC

## 2023-12-12 NOTE — Progress Notes (Signed)
 Notify patient of labs reviewed thyroid  levels has significantly improved however still mildly low.  Thyroglobulin tumor marker has also significantly improved however still mildly elevated.  I would like to increase the dose of levothyroxine  from 200 to 224 mcg daily.  Thyroid  lab during follow-up visit in June.  Sent prescription for levothyroxine  112 mcg to be taken 2 tablets daily together.

## 2023-12-12 NOTE — Telephone Encounter (Signed)
 Patient given results and medication changes as directed by MD. No further questions at this time.

## 2023-12-12 NOTE — Telephone Encounter (Signed)
-----   Message from [place] Thapa sent at 12/12/2023  1:52 PM EDT ----- Notify patient of labs reviewed thyroid  levels has significantly improved however still mildly low.  Thyroglobulin tumor marker has also significantly improved however still mildly elevated.  I would like to increase the dose of levothyroxine  from 200 to 224 mcg daily.  Thyroid  lab during follow-up visit in June.  Sent prescription for levothyroxine  112 mcg to be taken 2 tablets daily together.

## 2024-01-10 ENCOUNTER — Other Ambulatory Visit: Payer: Self-pay | Admitting: Family Medicine

## 2024-01-10 DIAGNOSIS — E538 Deficiency of other specified B group vitamins: Secondary | ICD-10-CM

## 2024-01-31 ENCOUNTER — Ambulatory Visit: Admitting: Endocrinology

## 2024-02-01 ENCOUNTER — Ambulatory Visit: Admitting: Endocrinology

## 2024-04-23 ENCOUNTER — Ambulatory Visit: Admitting: Endocrinology

## 2024-05-28 DIAGNOSIS — Z0000 Encounter for general adult medical examination without abnormal findings: Secondary | ICD-10-CM | POA: Diagnosis not present

## 2024-05-28 DIAGNOSIS — E7849 Other hyperlipidemia: Secondary | ICD-10-CM | POA: Diagnosis not present

## 2024-05-28 DIAGNOSIS — I10 Essential (primary) hypertension: Secondary | ICD-10-CM | POA: Diagnosis not present

## 2024-05-28 DIAGNOSIS — Z125 Encounter for screening for malignant neoplasm of prostate: Secondary | ICD-10-CM | POA: Diagnosis not present

## 2024-05-28 DIAGNOSIS — E038 Other specified hypothyroidism: Secondary | ICD-10-CM | POA: Diagnosis not present

## 2024-05-28 DIAGNOSIS — Z1211 Encounter for screening for malignant neoplasm of colon: Secondary | ICD-10-CM | POA: Diagnosis not present

## 2024-05-28 DIAGNOSIS — Z136 Encounter for screening for cardiovascular disorders: Secondary | ICD-10-CM | POA: Diagnosis not present

## 2024-05-28 DIAGNOSIS — L989 Disorder of the skin and subcutaneous tissue, unspecified: Secondary | ICD-10-CM | POA: Diagnosis not present

## 2024-05-28 DIAGNOSIS — N400 Enlarged prostate without lower urinary tract symptoms: Secondary | ICD-10-CM | POA: Diagnosis not present

## 2024-06-11 ENCOUNTER — Ambulatory Visit: Admitting: Endocrinology

## 2024-07-16 ENCOUNTER — Ambulatory Visit: Admitting: Endocrinology

## 2024-08-20 ENCOUNTER — Ambulatory Visit: Admitting: Endocrinology

## 2024-09-11 ENCOUNTER — Other Ambulatory Visit (HOSPITAL_COMMUNITY)
Admission: RE | Admit: 2024-09-11 | Discharge: 2024-09-11 | Disposition: A | Discharge status: Home / Self Care | Source: Ambulatory Visit | Attending: Endocrinology | Admitting: Endocrinology

## 2024-09-11 DIAGNOSIS — R7303 Prediabetes: Secondary | ICD-10-CM | POA: Insufficient documentation

## 2024-09-11 DIAGNOSIS — C73 Malignant neoplasm of thyroid gland: Secondary | ICD-10-CM | POA: Insufficient documentation

## 2024-09-11 DIAGNOSIS — E890 Postprocedural hypothyroidism: Secondary | ICD-10-CM | POA: Diagnosis present

## 2024-09-11 LAB — HEMOGLOBIN A1C
Hgb A1c MFr Bld: 6.1 % — ABNORMAL HIGH (ref 4.8–5.6)
Mean Plasma Glucose: 128.37 mg/dL

## 2024-09-11 LAB — T4, FREE: Free T4: 0.98 ng/dL (ref 0.80–2.00)

## 2024-09-11 LAB — TSH: TSH: 10.4 u[IU]/mL — ABNORMAL HIGH (ref 0.350–4.500)

## 2024-09-12 LAB — THYROGLOBULIN ANTIBODY: Thyroglobulin Antibody: <1 [IU]/mL (ref 0.0–0.9)

## 2024-09-13 ENCOUNTER — Ambulatory Visit: Payer: Self-pay | Admitting: Endocrinology

## 2024-09-13 LAB — MISC LABCORP TEST (SEND OUT): Labcorp test code: 42045

## 2024-09-20 LAB — THYROGLOBULIN LEVEL: Thyroglobulin: <2 ng/mL

## 2024-09-26 ENCOUNTER — Ambulatory Visit: Admitting: Endocrinology
